# Patient Record
Sex: Male | Born: 1940 | Race: White | Hispanic: No | Marital: Married | Smoking: Current every day smoker
Health system: Southern US, Community
[De-identification: ages and names within clinical notes are randomized; demographics above are authoritative.]

## PROBLEM LIST (undated history)

## (undated) DIAGNOSIS — I739 Peripheral vascular disease, unspecified: Secondary | ICD-10-CM

## (undated) DIAGNOSIS — Z860100 Personal history of colon polyps, unspecified: Secondary | ICD-10-CM

## (undated) DIAGNOSIS — I10 Essential (primary) hypertension: Secondary | ICD-10-CM

## (undated) DIAGNOSIS — Z85828 Personal history of other malignant neoplasm of skin: Secondary | ICD-10-CM

## (undated) DIAGNOSIS — C801 Malignant (primary) neoplasm, unspecified: Secondary | ICD-10-CM

## (undated) DIAGNOSIS — M5090 Cervical disc disorder, unspecified, unspecified cervical region: Secondary | ICD-10-CM

## (undated) DIAGNOSIS — Z800 Family history of malignant neoplasm of digestive organs: Secondary | ICD-10-CM

## (undated) DIAGNOSIS — M5412 Radiculopathy, cervical region: Secondary | ICD-10-CM

## (undated) DIAGNOSIS — G5792 Unspecified mononeuropathy of left lower limb: Secondary | ICD-10-CM

## (undated) DIAGNOSIS — M795 Residual foreign body in soft tissue: Secondary | ICD-10-CM

## (undated) DIAGNOSIS — K589 Irritable bowel syndrome without diarrhea: Secondary | ICD-10-CM

## (undated) DIAGNOSIS — K5790 Diverticulosis of intestine, part unspecified, without perforation or abscess without bleeding: Secondary | ICD-10-CM

## (undated) DIAGNOSIS — R220 Localized swelling, mass and lump, head: Secondary | ICD-10-CM

## (undated) DIAGNOSIS — K5731 Diverticulosis of large intestine without perforation or abscess with bleeding: Secondary | ICD-10-CM

## (undated) DIAGNOSIS — R251 Tremor, unspecified: Secondary | ICD-10-CM

## (undated) DIAGNOSIS — F1010 Alcohol abuse, uncomplicated: Secondary | ICD-10-CM

## (undated) DIAGNOSIS — I252 Old myocardial infarction: Secondary | ICD-10-CM

## (undated) DIAGNOSIS — J309 Allergic rhinitis, unspecified: Secondary | ICD-10-CM

## (undated) DIAGNOSIS — E785 Hyperlipidemia, unspecified: Secondary | ICD-10-CM

## (undated) DIAGNOSIS — D500 Iron deficiency anemia secondary to blood loss (chronic): Secondary | ICD-10-CM

## (undated) DIAGNOSIS — M278 Other specified diseases of jaws: Secondary | ICD-10-CM

## (undated) DIAGNOSIS — Z8619 Personal history of other infectious and parasitic diseases: Secondary | ICD-10-CM

## (undated) DIAGNOSIS — N529 Male erectile dysfunction, unspecified: Secondary | ICD-10-CM

## (undated) DIAGNOSIS — J449 Chronic obstructive pulmonary disease, unspecified: Secondary | ICD-10-CM

## (undated) DIAGNOSIS — Z720 Tobacco use: Secondary | ICD-10-CM

## (undated) DIAGNOSIS — M47812 Spondylosis without myelopathy or radiculopathy, cervical region: Secondary | ICD-10-CM

## (undated) DIAGNOSIS — H409 Unspecified glaucoma: Secondary | ICD-10-CM

## (undated) DIAGNOSIS — D519 Vitamin B12 deficiency anemia, unspecified: Secondary | ICD-10-CM

## (undated) DIAGNOSIS — M109 Gout, unspecified: Secondary | ICD-10-CM

## (undated) DIAGNOSIS — I2510 Atherosclerotic heart disease of native coronary artery without angina pectoris: Secondary | ICD-10-CM

## (undated) DIAGNOSIS — L309 Dermatitis, unspecified: Secondary | ICD-10-CM

## (undated) HISTORY — PX: HERNIA REPAIR: SHX51

## (undated) HISTORY — DX: Personal history of other malignant neoplasm of skin: Z85.828

## (undated) HISTORY — DX: Personal history of colon polyps, unspecified: Z86.0100

## (undated) HISTORY — DX: Vitamin B12 deficiency anemia, unspecified: D51.9

## (undated) HISTORY — DX: Male erectile dysfunction, unspecified: N52.9

## (undated) HISTORY — DX: Chronic obstructive pulmonary disease, unspecified: J44.9

## (undated) HISTORY — DX: Unspecified glaucoma: H40.9

## (undated) HISTORY — DX: Personal history of other infectious and parasitic diseases: Z86.19

## (undated) HISTORY — DX: Alcohol abuse, uncomplicated: F10.10

## (undated) HISTORY — DX: Radiculopathy, cervical region: M54.12

## (undated) HISTORY — DX: Cervical disc disorder, unspecified, unspecified cervical region: M50.90

## (undated) HISTORY — DX: Atherosclerotic heart disease of native coronary artery without angina pectoris: I25.10

## (undated) HISTORY — DX: Diverticulosis of large intestine without perforation or abscess with bleeding: K57.31

## (undated) HISTORY — DX: Personal history of colonic polyps: Z86.010

## (undated) HISTORY — DX: Diverticulosis of intestine, part unspecified, without perforation or abscess without bleeding: K57.90

## (undated) HISTORY — DX: Old myocardial infarction: I25.2

## (undated) HISTORY — DX: Gout, unspecified: M10.9

## (undated) HISTORY — PX: CATARACT EXTRACTION: SUR2

## (undated) HISTORY — DX: Other specified diseases of jaws: M27.8

## (undated) HISTORY — PX: LARYNX SURGERY: SHX692

## (undated) HISTORY — DX: Allergic rhinitis, unspecified: J30.9

## (undated) HISTORY — DX: Tremor, unspecified: R25.1

## (undated) HISTORY — DX: Family history of malignant neoplasm of digestive organs: Z80.0

## (undated) HISTORY — DX: Tobacco use: Z72.0

## (undated) HISTORY — DX: Hyperlipidemia, unspecified: E78.5

## (undated) HISTORY — DX: Irritable bowel syndrome, unspecified: K58.9

## (undated) HISTORY — DX: Localized swelling, mass and lump, head: R22.0

## (undated) HISTORY — DX: Unspecified mononeuropathy of left lower limb: G57.92

## (undated) HISTORY — DX: Spondylosis without myelopathy or radiculopathy, cervical region: M47.812

## (undated) HISTORY — DX: Iron deficiency anemia secondary to blood loss (chronic): D50.0

## (undated) HISTORY — DX: Essential (primary) hypertension: I10

---

## 1996-07-01 DIAGNOSIS — I252 Old myocardial infarction: Secondary | ICD-10-CM | POA: Insufficient documentation

## 1996-07-01 HISTORY — DX: Old myocardial infarction: I25.2

## 2000-09-10 ENCOUNTER — Ambulatory Visit: Admission: RE | Admit: 2000-09-10 | Discharge: 2000-09-10 | Payer: Self-pay | Admitting: Internal Medicine

## 2004-08-27 ENCOUNTER — Ambulatory Visit: Payer: Self-pay | Admitting: *Deleted

## 2005-11-11 ENCOUNTER — Ambulatory Visit: Payer: Self-pay

## 2006-01-16 ENCOUNTER — Ambulatory Visit: Payer: Self-pay | Admitting: Cardiology

## 2006-04-02 ENCOUNTER — Ambulatory Visit (HOSPITAL_COMMUNITY): Admission: RE | Admit: 2006-04-02 | Discharge: 2006-04-02 | Payer: Self-pay | Admitting: Neurosurgery

## 2013-07-07 LAB — HM COLONOSCOPY

## 2016-10-23 ENCOUNTER — Ambulatory Visit: Payer: Self-pay | Admitting: Sports Medicine

## 2017-04-09 ENCOUNTER — Other Ambulatory Visit: Payer: Self-pay

## 2017-04-09 DIAGNOSIS — I739 Peripheral vascular disease, unspecified: Secondary | ICD-10-CM

## 2017-04-11 ENCOUNTER — Encounter: Payer: Self-pay | Admitting: Vascular Surgery

## 2017-04-14 ENCOUNTER — Encounter: Payer: Self-pay | Admitting: Vascular Surgery

## 2017-04-24 ENCOUNTER — Encounter (HOSPITAL_COMMUNITY): Payer: Medicare Other

## 2017-04-24 ENCOUNTER — Encounter: Payer: Medicare Other | Admitting: Vascular Surgery

## 2017-06-02 ENCOUNTER — Ambulatory Visit (HOSPITAL_COMMUNITY)
Admission: RE | Admit: 2017-06-02 | Discharge: 2017-06-02 | Disposition: A | Discharge status: Home / Self Care | Payer: Medicare Other | Source: Ambulatory Visit | Attending: Vascular Surgery | Admitting: Vascular Surgery

## 2017-06-02 ENCOUNTER — Ambulatory Visit (INDEPENDENT_AMBULATORY_CARE_PROVIDER_SITE_OTHER)
Admission: RE | Admit: 2017-06-02 | Discharge: 2017-06-02 | Disposition: A | Discharge status: Home / Self Care | Payer: Medicare Other | Source: Ambulatory Visit | Attending: Vascular Surgery | Admitting: Vascular Surgery

## 2017-06-02 ENCOUNTER — Ambulatory Visit (INDEPENDENT_AMBULATORY_CARE_PROVIDER_SITE_OTHER): Payer: Medicare Other | Admitting: Surgery

## 2017-06-02 ENCOUNTER — Other Ambulatory Visit: Payer: Self-pay

## 2017-06-02 ENCOUNTER — Encounter: Payer: Self-pay | Admitting: Surgery

## 2017-06-02 VITALS — BP 152/79 | HR 69 | Temp 98.1°F | Resp 18 | Ht 68.0 in | Wt 146.0 lb

## 2017-06-02 DIAGNOSIS — I739 Peripheral vascular disease, unspecified: Secondary | ICD-10-CM

## 2017-06-02 DIAGNOSIS — I70213 Atherosclerosis of native arteries of extremities with intermittent claudication, bilateral legs: Secondary | ICD-10-CM | POA: Diagnosis not present

## 2017-06-02 LAB — VAS US LOWER EXTREMITY ARTERIAL DUPLEX
LSFDPSV: 33 cm/s
Left ant tibial distal sys: 16 cm/s
Left super femoral mid sys PSV: -44 cm/s
Left super femoral prox sys PSV: -93 cm/s
RATIBDISTSYS: 8 cm/s
RSFPPSV: 16 cm/s
RTIBDISTSYS: -19 cm/s
Right peroneal sys PSV: 10 cm/s
Right super femoral dist sys PSV: -25 cm/s
Right super femoral mid sys PSV: -19 cm/s
left post tibial dist sys: -14 cm/s

## 2017-06-02 NOTE — Progress Notes (Signed)
[facility] [place]  Patient name: Brian Yates MRN: [id number] DOB: 01/17/1941 Sex: male   REQUESTING PROVIDER:    Dr. Carolanne Grumbling   REASON FOR CONSULT:    PAD  HISTORY OF PRESENT ILLNESS:   Brian Yates is a 76 y.o. male, who is referred today for evaluation of peripheral vascular disease.  The patient states that he has been having difficulty with his legs and walking for long time but it has recently gotten worse.  He states that he can walk approximately 1/4 mile before he has to stop.  He complains of tightening in his calves.  The right leg bothers him more than the left.  He does not have any open wounds.  He denies having any rest pain.  Patient has a history of coronary artery disease.  In the past he has been told he had a heart attack however he had a normal workup recently.  He takes a statin for hypercholesterolemia.  His blood pressure has been well controlled however it has been somewhat erratic recently since he started taking steroids for itching.  PAST MEDICAL HISTORY    Past Medical History:  Diagnosis Date  . Alcohol abuse   . Anemia due to gastrointestinal blood loss   . AR (allergic rhinitis)   . 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 Cervical disc disease   . Cervical radiculopathy   . COLD (chronic obstructive lung disease) ([place])   . Controlled gout   . Coronary atherosclerosis of native coronary artery   . Diverticulosis   . Diverticulosis of colon with hemorrhage   . Erectile dysfunction   . FH: colon cancer   . Glaucoma   . History of hepatitis B   . History of skin cancer   . Hx of colonic polyps   . Hyperlipidemia   . Hypertension   . Irritable bowel syndrome   . Mass of jaw   . Neuropathy of left foot   . Previous myocardial infarction older than 8 weeks 1998  . Tobacco abuse   . Tremor   . Vitamin B12 deficiency anemia      FAMILY HISTORY   Family History   Problem Relation Age of Onset  . Cancer Mother   . Hypertension Father   . Hyperlipidemia Father   . Heart disease Father   . Cancer Sister   . Stroke Maternal Aunt     SOCIAL HISTORY:   Social History   Socioeconomic History  . Marital status: Married    Spouse name: Not on file  . Number of children: Not on file  . Years of education: Not on file  . Highest education level: Not on file  Social Needs  . Financial resource strain: Not on file  . Food insecurity - worry: Not on file  . Food insecurity - inability: Not on file  . Transportation needs - medical: Not on file  . Transportation needs - non-medical: Not on file  Occupational History  . Not on file  Tobacco Use  . Smoking status: Current Every Day Smoker  . Smokeless tobacco: Never Used  Substance and Sexual Activity  . Alcohol use: No  . Drug use: No  . Sexual activity: Not on file  Other Topics Concern  . Not on file  Social History Narrative  . Not on file    ALLERGIES:    Allergies  Allergen Reactions  . Codeine Sulfate [Codeine]  CURRENT MEDICATIONS:    Current Outpatient Medications  Medication Sig Dispense Refill  . Ascorbic Acid (VITAMIN C) 1000 MG tablet Take 1,000 mg by mouth daily.    Marland Kitchen atorvastatin (LIPITOR) 40 MG tablet Take 40 mg by mouth daily.    . brimonidine (ALPHAGAN P) 0.1 % SOLN Place 1 drop into both eyes daily.    Marland Kitchen CALCIUM-VITAMIN D PO Take 600 mg by mouth 2 (two) times daily.    . candesartan (ATACAND) 16 MG tablet Take 16 mg by mouth daily.    . candesartan-hydrochlorothiazide (ATACAND HCT) 16-12.5 MG tablet Take 1 tablet by mouth daily.    . cholecalciferol (VITAMIN D) 1000 units tablet Take 1,000 Units by mouth daily.    . Coenzyme Q10 100 MG TABS Take 100 mg by mouth daily.    . colchicine 0.6 MG tablet Take 0.6 mg by mouth every 6 (six) hours.    Marland Kitchen DICYCLOMINE HCL PO Take 20 mg by mouth 4 (four) times daily.    . diphenhydrAMINE (BENADRYL) 25 MG tablet Take 25  mg by mouth as needed.    . dorzolamide (TRUSOPT) 2 % ophthalmic solution Place 1 drop into the right eye 2 (two) times daily.    Marland Kitchen doxepin (SINEQUAN) 10 MG capsule Take 10 mg by mouth 2 (two) times daily.    . fenofibrate micronized (LOFIBRA) 134 MG capsule Take 134 mg by mouth daily before breakfast.    . fexofenadine (ALLEGRA) 180 MG tablet Take 180 mg by mouth daily.    . Glucosamine Sulfate 1000 MG CAPS Take 1,000 mg by mouth daily.    Marland Kitchen HYDROXYZINE HCL PO Take 50 mg by mouth 4 (four) times daily as needed.    . isosorbide mononitrate (IMDUR) 30 MG 24 hr tablet Take 30 mg by mouth daily.    . Lactobacillus (PROBIOTIC ACIDOPHILUS PO) Take by mouth daily.    Marland Kitchen latanoprost (XALATAN) 0.005 % ophthalmic solution Place 1 drop into both eyes at bedtime.    . nitroGLYCERIN (NITROSTAT) 0.4 MG SL tablet Place 0.4 mg under the tongue every 5 (five) minutes as needed for chest pain.    . Omega-3 Fatty Acids (FISH OIL BURP-LESS) 500 MG CAPS Take 500 mg by mouth 2 (two) times daily.    . prednisoLONE 5 MG TABS tablet Take 5 mg by mouth daily.    . TRAZODONE HCL PO Take 50 mg by mouth at bedtime.    . triamcinolone cream (TRIDERM) 0.5 % Apply 1 application topically daily.    . vitamin B-12 (CYANOCOBALAMIN) 1000 MCG tablet Take 1,000 mcg by mouth 2 (two) times daily.     No current facility-administered medications for this visit.     REVIEW OF SYSTEMS:   [X]  denotes positive finding, [ ]  denotes negative finding Cardiac  Comments:  Chest pain or chest pressure:    Shortness of breath upon exertion:    Short of breath when lying flat:    Irregular heart rhythm:        Vascular    Pain in calf, thigh, or hip brought on by ambulation: x   Pain in feet at night that wakes you up from your sleep:     Blood clot in your veins:    Leg swelling:         Pulmonary    Oxygen at home:    Productive cough:  x   Wheezing:  x       Neurologic    Sudden weakness  in arms or legs:     Sudden numbness  in arms or legs:     Sudden onset of difficulty speaking or slurred speech:    Temporary loss of vision in one eye:     Problems with dizziness:         Gastrointestinal    Blood in stool:      Vomited blood:         Genitourinary    Burning when urinating:     Blood in urine:        Psychiatric    Major depression:         Hematologic    Bleeding problems:    Problems with blood clotting too easily:        Skin    Rashes or ulcers:        Constitutional    Fever or chills:     PHYSICAL EXAM:   Vitals:   06/02/17 1318 06/02/17 1324  BP: (!) 148/67 (!) 152/79  Pulse: 69 69  Resp: 18   Temp: 98.1 F (36.7 C)   TempSrc: Oral   SpO2: 97%   Weight: 146 lb (66.2 kg)   Height: 5\' 8"  (1.727 m)     GENERAL: The patient is a well-nourished male, in no acute distress. The vital signs are documented above. CARDIAC: There is a regular rate and rhythm.  VASCULAR: Palpable femoral pulses bilaterally.  Pedal pulses are nonpalpable.  No carotid bruits.  No pulsatile abdominal mass. PULMONARY: Nonlabored respirations ABDOMEN: Soft and non-tender  MUSCULOSKELETAL: There are no major deformities or cyanosis. NEUROLOGIC: No focal weakness or paresthesias are detected. SKIN: There are no ulcers or rashes noted. PSYCHIATRIC: The patient has a normal affect.  STUDIES:   I reviewed his vascular lab studies.  The ABIs are 0.7 on the right and 0.69 on the left.  There is a 50-74% stenosis in the right proximal SFA.  There is a 50-70% stenosis in the left deep femoral artery stenosis in the distal left superficial femoral artery.  ASSESSMENT and PLAN   PAD: The patient does not yet have symptoms of lifestyle limiting claudication although he does have significant symptoms.  We stressed the importance of medical management.  This would include taking antiplatelet medication  (81 mg) if possible, given his GI bleed 2 years ago.  He should continue his statin as well as his blood pressure  medication.  We addressed the issue of smoking, he is not at a point where he would like to quit.  We also discussed an exercise regimen and what this program will look like.  He is going to try to improve his exercise.  He is not interested in intervention at this point in time nor did I recommend this for him.  I would not give him a trial of cilostazol given his history of GI bleed.  He will try to implement the strategies and follow-up with me in 6 months.   Annamarie Major, MD [facility]: [place] [address] [phone] Pager [phone]

## 2017-06-04 ENCOUNTER — Encounter: Payer: Self-pay | Admitting: Internal Medicine

## 2017-06-13 NOTE — Addendum Note (Signed)
Addended by: Lianne Cure A on: 06/13/2017 12:24 PM   Modules accepted: Orders

## 2017-12-08 ENCOUNTER — Encounter (HOSPITAL_COMMUNITY): Payer: Medicare Other

## 2017-12-08 ENCOUNTER — Ambulatory Visit: Payer: Medicare Other | Admitting: Surgery

## 2017-12-29 ENCOUNTER — Encounter: Payer: Self-pay | Admitting: Surgery

## 2017-12-29 ENCOUNTER — Other Ambulatory Visit: Payer: Self-pay

## 2017-12-29 ENCOUNTER — Ambulatory Visit (HOSPITAL_COMMUNITY)
Admission: RE | Admit: 2017-12-29 | Discharge: 2017-12-29 | Disposition: A | Discharge status: Home / Self Care | Payer: Medicare Other | Source: Ambulatory Visit | Attending: Surgery | Admitting: Surgery

## 2017-12-29 ENCOUNTER — Ambulatory Visit (INDEPENDENT_AMBULATORY_CARE_PROVIDER_SITE_OTHER): Payer: Medicare Other | Admitting: Surgery

## 2017-12-29 VITALS — BP 126/54 | HR 46 | Temp 97.9°F | Resp 16 | Ht 68.0 in | Wt 158.0 lb

## 2017-12-29 DIAGNOSIS — I10 Essential (primary) hypertension: Secondary | ICD-10-CM | POA: Diagnosis not present

## 2017-12-29 DIAGNOSIS — E785 Hyperlipidemia, unspecified: Secondary | ICD-10-CM | POA: Diagnosis not present

## 2017-12-29 DIAGNOSIS — I70213 Atherosclerosis of native arteries of extremities with intermittent claudication, bilateral legs: Secondary | ICD-10-CM | POA: Diagnosis not present

## 2017-12-29 DIAGNOSIS — F17200 Nicotine dependence, unspecified, uncomplicated: Secondary | ICD-10-CM | POA: Diagnosis not present

## 2017-12-29 NOTE — Progress Notes (Signed)
[facility] [place]  Patient name: Brian Yates MRN: [id number] DOB: 07-May-1941 Sex: male   REASON FOR VISIT:    Follow up claudication  HISOTRY OF PRESENT ILLNESS:    Brian Yates is a 77 y.o. male, who returns today for follow up of his peripheral vascular disease.  The patient has been having difficulty with his legs and walking for long time but it had recently gotten worse, and therefore he was scheduled to see me.  In December 2018, he could walk approximately 1/4 mile before he had to stop.  He complained of tightening in his calves, which was more severe in the right leg.  His ultrasound showed bilateral SFA stenoses.  He was not at a point where I felt intervention was recommended.  We talked about optimizing his medical management.  We talked about smoking cessation which he was not ready to start.  I did not start him on Pletal because of his history of GI bleed.  We did discuss the importance of an exercise program.  He states today that he has been trying to improve his exercise.  He has not noticed any change in his symptoms.  He is now considering smoking cessation  Patient has a history of coronary artery disease.  In the past he has been told he had a heart attack however he had a normal workup recently.  He takes a statin for hypercholesterolemia.  His blood pressure has been well controlled however it has been somewhat erratic recently since he started taking steroids for itching.   PAST MEDICAL HISTORY:   Past Medical History:  Diagnosis Date  . Alcohol abuse   . Anemia due to gastrointestinal blood loss   . AR (allergic rhinitis)   . 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 Cervical disc disease   . Cervical radiculopathy   . COLD (chronic obstructive lung disease) ([place])   . Controlled gout   . Coronary atherosclerosis of native coronary artery   . Diverticulosis   . Diverticulosis of  colon with hemorrhage   . Erectile dysfunction   . FH: colon cancer   . Glaucoma   . History of hepatitis B   . History of skin cancer   . Hx of colonic polyps   . Hyperlipidemia   . Hypertension   . Irritable bowel syndrome   . Mass of jaw   . Neuropathy of left foot   . Previous myocardial infarction older than 8 weeks 1998  . Tobacco abuse   . Tremor   . Vitamin B12 deficiency anemia      FAMILY HISTORY:   Family History  Problem Relation Age of Onset  . Cancer Mother   . Hypertension Father   . Hyperlipidemia Father   . Heart disease Father   . Cancer Sister   . Stroke Maternal Aunt     SOCIAL HISTORY:   Social History   Tobacco Use  . Smoking status: Current Every Day Smoker  . Smokeless tobacco: Never Used  Substance Use Topics  . Alcohol use: No     ALLERGIES:   Allergies  Allergen Reactions  . Codeine Sulfate [Codeine]      CURRENT MEDICATIONS:   Current Outpatient Medications  Medication Sig Dispense Refill  . Ascorbic Acid (VITAMIN C) 1000 MG tablet Take 1,000 mg by mouth daily.    Marland Kitchen atorvastatin (LIPITOR) 40 MG tablet Take 20 mg by mouth daily.     Marland Kitchen  brimonidine (ALPHAGAN P) 0.1 % SOLN Place 1 drop into both eyes daily.    Marland Kitchen CALCIUM-VITAMIN D PO Take 600 mg by mouth 2 (two) times daily.    . candesartan-hydrochlorothiazide (ATACAND HCT) 16-12.5 MG tablet Take 1 tablet by mouth daily.    . cholecalciferol (VITAMIN D) 1000 units tablet Take 1,000 Units by mouth daily.    . Coenzyme Q10 100 MG TABS Take 100 mg by mouth daily.    Marland Kitchen DICYCLOMINE HCL PO Take 20 mg by mouth 4 (four) times daily.    . fenofibrate micronized (LOFIBRA) 134 MG capsule Take 134 mg by mouth daily before breakfast.    . fexofenadine (ALLEGRA) 180 MG tablet Take 180 mg by mouth daily.    . Glucosamine Sulfate 1000 MG CAPS Take 1,000 mg by mouth daily.    Marland Kitchen HYDROXYZINE HCL PO Take 50 mg by mouth 4 (four) times daily as needed.    . isosorbide mononitrate (IMDUR) 30 MG 24  hr tablet Take 30 mg by mouth daily.    . Lactobacillus (PROBIOTIC ACIDOPHILUS PO) Take by mouth daily.    Marland Kitchen latanoprost (XALATAN) 0.005 % ophthalmic solution Place 1 drop into both eyes at bedtime.    . nitroGLYCERIN (NITROSTAT) 0.4 MG SL tablet Place 0.4 mg under the tongue every 5 (five) minutes as needed for chest pain.    . Omega-3 Fatty Acids (FISH OIL BURP-LESS) 500 MG CAPS Take 500 mg by mouth 2 (two) times daily.    . prednisoLONE 5 MG TABS tablet Take 5 mg by mouth daily.    . TRAZODONE HCL PO Take 50 mg by mouth at bedtime.    . triamcinolone cream (TRIDERM) 0.5 % Apply 1 application topically daily.    . vitamin B-12 (CYANOCOBALAMIN) 1000 MCG tablet Take 1,000 mcg by mouth 2 (two) times daily.     No current facility-administered medications for this visit.     REVIEW OF SYSTEMS:   [X]  denotes positive finding, [ ]  denotes negative finding Cardiac  Comments:  Chest pain or chest pressure:    Shortness of breath upon exertion:    Short of breath when lying flat:    Irregular heart rhythm:        Vascular    Pain in calf, thigh, or hip brought on by ambulation: x   Pain in feet at night that wakes you up from your sleep:     Blood clot in your veins:    Leg swelling:         Pulmonary    Oxygen at home:    Productive cough:  x   Wheezing:  x       Neurologic    Sudden weakness in arms or legs:  x   Sudden numbness in arms or legs:     Sudden onset of difficulty speaking or slurred speech:    Temporary loss of vision in one eye:     Problems with dizziness:         Gastrointestinal    Blood in stool:     Vomited blood:         Genitourinary    Burning when urinating:     Blood in urine:        Psychiatric    Major depression:         Hematologic    Bleeding problems:    Problems with blood clotting too easily:        Skin    Rashes  or ulcers:        Constitutional    Fever or chills:      PHYSICAL EXAM:   Vitals:   12/29/17 1110  BP: (!)  126/54  Pulse: (!) 46  Resp: 16  Temp: 97.9 F (36.6 C)  TempSrc: Oral  SpO2: 97%  Weight: 158 lb (71.7 kg)  Height: 5\' 8"  (1.727 m)    GENERAL: The patient is a well-nourished male, in no acute distress. The vital signs are documented above. CARDIAC: There is a regular rate and rhythm.  VASCULAR: Nonpalpable pedal pulses.  No carotid bruits. PULMONARY: Non-labored respirations MUSCULOSKELETAL: There are no major deformities or cyanosis. NEUROLOGIC: No focal weakness or paresthesias are detected. SKIN: There are no ulcers or rashes noted. PSYCHIATRIC: The patient has a normal affect.  STUDIES:   I have ordered and reviewed his vascular lab studies with the following findings: Right ABI= 0.37 (0.70) Left ABI= 0.49 (0.69)  MEDICAL ISSUES:   PAD: The patient has not had any change in his symptoms.  His ABIs have decreased.  At this time no additional recommendations were given.  He will continue with optimal medical management and a structured exercise program.  He knows to contact me should he develop a change in his symptoms, otherwise I will see him back in a year.  He tells me that he is considering smoking cessation, I have encouraged him to continue with this process.    Annamarie Major, MD [facility] [phone] Pager [phone]

## 2018-03-20 DIAGNOSIS — J381 Polyp of vocal cord and larynx: Secondary | ICD-10-CM | POA: Insufficient documentation

## 2018-03-20 DIAGNOSIS — R490 Dysphonia: Secondary | ICD-10-CM | POA: Insufficient documentation

## 2018-03-20 DIAGNOSIS — K148 Other diseases of tongue: Secondary | ICD-10-CM | POA: Insufficient documentation

## 2018-04-01 DIAGNOSIS — K219 Gastro-esophageal reflux disease without esophagitis: Secondary | ICD-10-CM | POA: Insufficient documentation

## 2018-06-17 DIAGNOSIS — R430 Anosmia: Secondary | ICD-10-CM | POA: Insufficient documentation

## 2019-11-22 NOTE — Progress Notes (Signed)
Cardiology Office Note:    Date:  11/23/2019   ID:  Brian Yates, DOB November 29, 1940, MRN [id number]  PCP:  Nicoletta Dress, MD  Cardiologist:  Shirlee More, MD   Referring MD: Nicoletta Dress, MD  ASSESSMENT:    1. Junctional rhythm   2. Right bundle branch block (RBBB) with left anterior hemiblock   3. Essential hypertension   4. Mixed hyperlipidemia   5. PAD (peripheral artery disease) ([place])   6. Coronary artery disease of bypass graft of native heart with stable angina pectoris ([place])    PLAN:    In order of problems listed above:  1. 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  Personally I think he is at high risk of needing a pacemaker and for further evaluation I asked him to allow [place] to do a 7-day ZIO monitor to document for other bradycardia arrhythmia.  I will also like him to get an echocardiogram to assess ejection fraction presence of previous myocardial infarction as if his ejection fraction was less than 35 we would need to consider ICD therapy along with a pacemaker if needed.  At this time I do not think he needs an ischemia evaluation he is on no rate slowing medication will follow up with me afterwards. 2. BP at target continue current treatment 3. Ideal lipids continue his combined statin fenofibrate 4. Severe limiting claudication I think he will except intervention if recommended by vascular surgery angiogram 5. Uncertain diagnosis of CAD check echocardiogram for evidence of LV dysfunction previous MI.  He is having no angina right now I would not pursue an ischemia evaluation  Next appointment 6 weeks   Medication Adjustments/Labs and Tests Ordered: Current medicines are reviewed at length with the patient today.  Concerns regarding medicines are outlined above.  Orders Placed This Encounter  Procedures  . LONG TERM MONITOR  (3-14 DAYS)  . EKG 12-Lead  . ECHOCARDIOGRAM COMPLETE   No orders of the defined types were placed in this encounter.    Chief Complaint  Patient presents with  . Bradycardia    History of Present Illness:    Brian Yates is a 79 y.o. male who is being seen today for the evaluation of atrial fibrillation at the request of Nicoletta Dress, MD.  He was referred after EKG showed atrial fibrillation bifascicular heart block and a slow ventricular response.  Review of medications shows that he is not anticoagulated and is on no rate slowing medications.  His CHA2DS2-VASc score is 4 moderate stroke risk. He has a history of PAD and coronary artery disease.  Other diagnoses from primary care physician records include coronary artery disease and COPD. He had a cerebrovascular duplex performed at [facility] system 10/21/2019 which shows retrograde left vertebral artery flow suggesting proximal subclavian artery stenosis and bilateral bifurcation and proximal ICA plaque 70 no 99% on the left less than 50% on the right EKG dated 10/21/2019 shows atrial fibrillation with a slow ventricular response right bundle branch block QRS duration 136 ms left anterior hemiblock.  Is retired Public house manager.  Unfortunately peripheral arterial disease limits up to about 50 to 70 yards on flat surface and at times his wife has to come pick him up in the car to bring him home.  He sees vascular surgery and may need intervention.  Years ago he was seen  by cardiology in [place] on the basis of EKG and stress test was told he had a previous MI but did not have coronary angiography Intervention.  Recently his ARB was discontinued because of low blood pressure.  He started tracking heart rate and blood pressure home and he gets a couple of episodes of rates of 45 to 50 bpm.  His primary care physician's office an EKG was done interpreted as atrial fibrillation but on further review shows a  junctional rhythm with retrograde P waves occasional conduction echo beat and rates as low as 45 to 50 bpm.  Surprisingly has been asymptomatic he has had no palpitations syncope near syncope no chest pain edema shortness of breath.  He complains bitterly of his peripheral vascular disease. Past Medical History:  Diagnosis Date  . Alcohol abuse   . Anemia due to gastrointestinal blood loss   . AR (allergic rhinitis)   . 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 Cervical disc disease   . Cervical radiculopathy   . COLD (chronic obstructive lung disease) ([place])   . Controlled gout   . Coronary atherosclerosis of native coronary artery   . Diverticulosis   . Diverticulosis of colon with hemorrhage   . Erectile dysfunction   . FH: colon cancer   . Glaucoma   . History of hepatitis B   . History of skin cancer   . Hx of colonic polyps   . Hyperlipidemia   . Hypertension   . Irritable bowel syndrome   . Mass of jaw   . Neuropathy of left foot   . Previous myocardial infarction older than 8 weeks 1998  . Tobacco abuse   . Tremor   . Vitamin B12 deficiency anemia     Past Surgical History:  Procedure Laterality Date  . CATARACT EXTRACTION Left   . HERNIA REPAIR Bilateral   . LARYNX SURGERY      Current Medications: Current Meds  Medication Sig  . Ascorbic Acid (VITAMIN C) 1000 MG tablet Take 1,000 mg by mouth daily.  Marland Kitchen atorvastatin (LIPITOR) 40 MG tablet Take 20 mg by mouth daily.   . brimonidine (ALPHAGAN P) 0.1 % SOLN Place 1 drop into both eyes daily.  Marland Kitchen CALCIUM-VITAMIN D PO Take 600 mg by mouth 2 (two) times daily.  . cholecalciferol (VITAMIN D) 1000 units tablet Take 1,000 Units by mouth daily.  . fenofibrate micronized (LOFIBRA) 134 MG capsule Take 134 mg by mouth daily before breakfast.  . hydrOXYzine (ATARAX/VISTARIL) 50 MG tablet Take 1 tablet by mouth 4 (four) times daily as needed.  . isosorbide mononitrate (IMDUR) 30 MG 24 hr tablet Take 30 mg by mouth daily.    . Lactobacillus (PROBIOTIC ACIDOPHILUS PO) Take by mouth daily.  Marland Kitchen latanoprost (XALATAN) 0.005 % ophthalmic solution Place 1 drop into both eyes at bedtime.  . Misc Natural Products (ADV TURMERIC CURCUMIN COMPLEX PO) Take by mouth.  Edyth Gunnels Oleifera (MORINGA PO) Take 700 mg by mouth 2 (two) times daily.  . nitroGLYCERIN (NITROSTAT) 0.4 MG SL tablet Place 0.4 mg under the tongue every 5 (five) minutes as needed for chest pain.  . Omega-3 Fatty Acids (FISH OIL BURP-LESS) 500 MG CAPS Take 500 mg by mouth 2 (two) times daily.  . temazepam (RESTORIL) 30 MG capsule Take 30 mg by mouth at bedtime as needed.  . TRAZODONE HCL PO Take 50 mg by mouth at bedtime.  . vitamin B-12 (CYANOCOBALAMIN) 1000 MCG tablet Take 1,000 mcg by  mouth 2 (two) times daily.     Allergies:   Codeine sulfate [codeine]   Social History   Socioeconomic History  . Marital status: Married    Spouse name: Not on file  . Number of children: Not on file  . Years of education: Not on file  . Highest education level: Not on file  Occupational History  . Not on file  Tobacco Use  . Smoking status: Current Every Day Smoker  . Smokeless tobacco: Never Used  Substance and Sexual Activity  . Alcohol use: No  . Drug use: No  . Sexual activity: Not on file  Other Topics Concern  . Not on file  Social History Narrative  . Not on file   Social Determinants of Health   Financial Resource Strain:   . Difficulty of Paying Living Expenses:   Food Insecurity:   . Worried About Charity fundraiser in the Last Year:   . Arboriculturist in the Last Year:   Transportation Needs:   . Film/video editor (Medical):   Marland Kitchen Lack of Transportation (Non-Medical):   Physical Activity:   . Days of Exercise per Week:   . Minutes of Exercise per Session:   Stress:   . Feeling of Stress :   Social Connections:   . Frequency of Communication with Friends and Family:   . Frequency of Social Gatherings with Friends and Family:   .  Attends Religious Services:   . Active Member of Clubs or Organizations:   . Attends Archivist Meetings:   Marland Kitchen Marital Status:      Family History: The patient's family history includes Cancer in his mother and sister; Heart disease in his father; Hyperlipidemia in his father; Hypertension in his father; Stroke in his maternal aunt.  ROS:   Review of Systems  Constitution: Negative.  HENT: Negative.   Eyes: Negative.   Cardiovascular: Negative.   Respiratory: Negative.   Endocrine: Negative.   Hematologic/Lymphatic: Negative.   Skin: Negative.   Musculoskeletal: Positive for muscle cramps and muscle weakness.  Gastrointestinal: Negative.   Genitourinary: Negative.   Neurological: Negative.   Psychiatric/Behavioral: Negative.   Allergic/Immunologic: Negative.    Please see the history of present illness.     All other systems reviewed and are negative.  EKGs/Labs/Other Studies Reviewed:    The following studies were reviewed today:   EKG:  EKG is  ordered today.  The ekg ordered today is personally reviewed and demonstrates sinus rhythm bifascicular heart block first-degree AV block.  Recent Labs: 08/10/2018: Cholesterol 112 triglycerides 63 HDL 43 LDL 56, lipids are at target Hemoglobin 12.1 normal Creatinine mildly elevated at 1.29  Physical Exam:    VS:  BP (!) 124/58   Pulse 64   Ht 5\' 8"  (1.727 m)   Wt 144 lb 3.2 oz (65.4 kg)   SpO2 97%   BMI 21.93 kg/m     Wt Readings from Last 3 Encounters:  11/23/19 144 lb 3.2 oz (65.4 kg)  12/29/17 158 lb (71.7 kg)  06/02/17 146 lb (66.2 kg)     GEN: He looks frail chronically ill COPD appearance well nourished, well developed in no acute distress HEENT: Normal NECK: No JVD; No carotid bruits LYMPHATICS: No lymphadenopathy CARDIAC: Distant heart sounds RRR, no murmurs, rubs, gallops RESPIRATORY:  Clear to auscultation without rales, wheezing or rhonchi  ABDOMEN: Soft, non-tender,  non-distended MUSCULOSKELETAL:  No edema; No deformity  SKIN: Warm and dry NEUROLOGIC:  Alert and oriented x 3 PSYCHIATRIC:  Normal affect     Signed, Shirlee More, MD  11/23/2019 11:56 AM    [place]

## 2019-11-23 ENCOUNTER — Ambulatory Visit (INDEPENDENT_AMBULATORY_CARE_PROVIDER_SITE_OTHER): Payer: Medicare Other

## 2019-11-23 ENCOUNTER — Ambulatory Visit (INDEPENDENT_AMBULATORY_CARE_PROVIDER_SITE_OTHER): Payer: Medicare Other | Admitting: Cardiology

## 2019-11-23 ENCOUNTER — Other Ambulatory Visit: Payer: Self-pay

## 2019-11-23 ENCOUNTER — Encounter: Payer: Self-pay | Admitting: Cardiology

## 2019-11-23 VITALS — BP 124/58 | HR 64 | Ht 68.0 in | Wt 144.2 lb

## 2019-11-23 DIAGNOSIS — I452 Bifascicular block: Secondary | ICD-10-CM

## 2019-11-23 DIAGNOSIS — I10 Essential (primary) hypertension: Secondary | ICD-10-CM | POA: Diagnosis not present

## 2019-11-23 DIAGNOSIS — I25708 Atherosclerosis of coronary artery bypass graft(s), unspecified, with other forms of angina pectoris: Secondary | ICD-10-CM

## 2019-11-23 DIAGNOSIS — I739 Peripheral vascular disease, unspecified: Secondary | ICD-10-CM

## 2019-11-23 DIAGNOSIS — I498 Other specified cardiac arrhythmias: Secondary | ICD-10-CM

## 2019-11-23 DIAGNOSIS — R002 Palpitations: Secondary | ICD-10-CM | POA: Diagnosis not present

## 2019-11-23 DIAGNOSIS — E782 Mixed hyperlipidemia: Secondary | ICD-10-CM

## 2019-11-23 NOTE — Patient Instructions (Signed)
Medication Instructions:  Your physician recommends that you continue on your current medications as directed. Please refer to the Current Medication list given to you today.  *If you need a refill on your cardiac medications before your next appointment, please call your pharmacy*   Lab Work: None If you have labs (blood work) drawn today and your tests are completely normal, you will receive your results only by: Marland Kitchen MyChart Message (if you have MyChart) OR . A paper copy in the mail If you have any lab test that is abnormal or we need to change your treatment, we will call you to review the results.   Testing/Procedures: A zio monitor was ordered today. It will remain on for 7 days. You will then return monitor and event diary in provided box. It takes 1-2 weeks for report to be downloaded and returned to [place]. We will call you with the results. If monitor falls off or has orange flashing light, please call Zio for further instructions.   Your physician has requested that you have an echocardiogram. Echocardiography is a painless test that uses sound waves to create images of your heart. It provides your doctor with information about the size and shape of your heart and how well your heart's chambers and valves are working. This procedure takes approximately one hour. There are no restrictions for this procedure.       Follow-Up: At [facility], you and your health needs are our priority.  As part of our continuing mission to provide you with exceptional heart care, we have created designated Provider Care Teams.  These Care Teams include your primary Cardiologist (physician) and Advanced Practice Providers (APPs -  Physician Assistants and Nurse Practitioners) who all work together to provide you with the care you need, when you need it.  We recommend signing up for the patient portal called "MyChart".  Sign up information is provided on this After Visit Summary.  MyChart is used to  connect with patients for Virtual Visits (Telemedicine).  Patients are able to view lab/test results, encounter notes, upcoming appointments, etc.  Non-urgent messages can be sent to your provider as well.   To learn more about what you can do with MyChart, go to [email or web address].    Your next appointment:   6 week(s)  The format for your next appointment:   In Person  Provider:   Shirlee More, MD   Other Instructions

## 2019-12-09 ENCOUNTER — Ambulatory Visit (INDEPENDENT_AMBULATORY_CARE_PROVIDER_SITE_OTHER): Payer: Medicare Other

## 2019-12-09 ENCOUNTER — Other Ambulatory Visit: Payer: Self-pay

## 2019-12-09 DIAGNOSIS — I10 Essential (primary) hypertension: Secondary | ICD-10-CM

## 2019-12-09 DIAGNOSIS — I25708 Atherosclerosis of coronary artery bypass graft(s), unspecified, with other forms of angina pectoris: Secondary | ICD-10-CM

## 2019-12-09 DIAGNOSIS — I452 Bifascicular block: Secondary | ICD-10-CM | POA: Diagnosis not present

## 2019-12-09 DIAGNOSIS — I498 Other specified cardiac arrhythmias: Secondary | ICD-10-CM | POA: Diagnosis not present

## 2019-12-09 NOTE — Progress Notes (Signed)
Complete echocardiogram performed.  Jimmy Balen Woolum RDCS, RVT  

## 2019-12-10 ENCOUNTER — Telehealth: Payer: Self-pay

## 2019-12-10 NOTE — Telephone Encounter (Signed)
-----   Message from Richardo Priest, MD sent at 12/10/2019  7:58 AM EDT ----- Normal or stable result  We did the test to look at left ventricular function is normal I can review the other findings will be see him back in the office in follow-up

## 2019-12-10 NOTE — Telephone Encounter (Signed)
Spoke with patient regarding results and recommendation.  Patient verbalizes understanding and is agreeable to plan of care. Advised patient to call back with any issues or concerns.  

## 2019-12-10 NOTE — Telephone Encounter (Signed)
Left message on patients voicemail to please return our call.   

## 2019-12-16 ENCOUNTER — Telehealth: Payer: Self-pay

## 2019-12-16 DIAGNOSIS — I440 Atrioventricular block, first degree: Secondary | ICD-10-CM

## 2019-12-16 NOTE — Telephone Encounter (Signed)
-----   Message from Richardo Priest, MD sent at 12/16/2019 12:57 PM EDT ----- Normal or stable result  He is having frequent episodes of sinus pauses up to 3.5 seconds with bifascicular heart block and first-degree AV block.

## 2019-12-16 NOTE — Telephone Encounter (Signed)
Spoke to patients son Brian Yates regarding these results and recommendations. I let him know that Dr. Bettina Gavia would like for him to be seen by EP as soon as possible. I let him know that I put in the referral and they should receive a call soon to schedule this appointment. He verbalizes understanding and states that he will let his mother and father know as soon as he can but they are at a funeral today and that is why they could not answer my call.   I am routing this to [address] to get this patient scheduled with one of their EP doctors.

## 2019-12-17 ENCOUNTER — Telehealth: Payer: Self-pay | Admitting: Cardiology

## 2019-12-17 NOTE — Telephone Encounter (Signed)
Spoke to the patient just now and went over his results and recommendations with him. He states that he did speak with his son and he went over everything with him as well. They are awaiting a call to be scheduled with an EP doctor at this time.

## 2019-12-17 NOTE — Telephone Encounter (Signed)
Patient is returning Morgan's call in regards to monitor results.

## 2019-12-20 ENCOUNTER — Other Ambulatory Visit: Payer: Self-pay | Admitting: *Deleted

## 2019-12-20 DIAGNOSIS — I70213 Atherosclerosis of native arteries of extremities with intermittent claudication, bilateral legs: Secondary | ICD-10-CM

## 2019-12-30 ENCOUNTER — Ambulatory Visit: Payer: Medicare Other | Admitting: Cardiology

## 2019-12-31 ENCOUNTER — Encounter (HOSPITAL_COMMUNITY): Payer: Medicare Other

## 2019-12-31 ENCOUNTER — Ambulatory Visit: Payer: Medicare Other

## 2020-01-10 ENCOUNTER — Encounter (HOSPITAL_COMMUNITY): Payer: Medicare Other

## 2020-01-10 ENCOUNTER — Ambulatory Visit: Payer: Medicare Other

## 2020-01-24 ENCOUNTER — Other Ambulatory Visit: Payer: Self-pay

## 2020-01-24 ENCOUNTER — Encounter: Payer: Self-pay | Admitting: Cardiology

## 2020-01-24 ENCOUNTER — Ambulatory Visit (INDEPENDENT_AMBULATORY_CARE_PROVIDER_SITE_OTHER): Payer: Medicare Other | Admitting: Cardiology

## 2020-01-24 DIAGNOSIS — I455 Other specified heart block: Secondary | ICD-10-CM | POA: Diagnosis not present

## 2020-01-24 NOTE — Progress Notes (Signed)
Electrophysiology Office Note   Date:  01/24/2020   ID:  SARON TWEED, DOB 15-Feb-1941, MRN [id number]  PCP:  Nicoletta Dress, MD  Cardiologist: Bettina Gavia Primary Electrophysiologist:  Lorenza Winkleman Meredith Leeds, MD    Chief Complaint: Sinus pauses   History of Present Illness: Brian Yates is a 79 y.o. male who is being seen today for the evaluation of bradycardia at the request of Nicoletta Dress, MD. Presenting today for electrophysiology evaluation.  He has a history of atrial fibrillation, bifascicular block, coronary artery disease, COPD, hepatitis B, hypertension, hyperlipidemia.  He also has peripheral arterial disease and is limited in walking of about 50 to 70 yards on a flat surface.  He presented to his primary care's office in junctional rhythm.  His heart rates were in the high 40s to 50s at the time.  He wore cardiac monitor that showed sinus rhythm with episodic sinus pauses.    Today, he denies symptoms of palpitations, chest pain, shortness of breath, orthopnea, PND, lower extremity edema, claudication, dizziness, presyncope, syncope, bleeding, or neurologic sequela. The patient is tolerating medications without difficulties.  He currently feels well.  He has no chest pain or shortness of breath.  Unfortunately, he had a chest x-ray done that showed a mass in his lung.  He is a former smoker and has plans for CT scan to be done.  Aside from that   Past Medical History:  Diagnosis Date  . Alcohol abuse   . Anemia due to gastrointestinal blood loss   . AR (allergic rhinitis)   . 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 Cervical disc disease   . Cervical radiculopathy   . COLD (chronic obstructive lung disease) ([place])   . Controlled gout   . Coronary atherosclerosis of native coronary artery   . Diverticulosis   . Diverticulosis of colon with hemorrhage   . Erectile dysfunction   . FH: colon cancer   . Glaucoma   . History of hepatitis B   . History of  skin cancer   . Hx of colonic polyps   . Hyperlipidemia   . Hypertension   . Irritable bowel syndrome   . Mass of jaw   . Neuropathy of left foot   . Previous myocardial infarction older than 8 weeks 1998  . Tobacco abuse   . Tremor   . Vitamin B12 deficiency anemia    Past Surgical History:  Procedure Laterality Date  . CATARACT EXTRACTION Left   . HERNIA REPAIR Bilateral   . LARYNX SURGERY       Current Outpatient Medications  Medication Sig Dispense Refill  . Ascorbic Acid (VITAMIN C) 1000 MG tablet Take 1,000 mg by mouth daily.    Marland Kitchen aspirin EC 81 MG tablet Take 81 mg by mouth daily. Swallow whole.    Marland Kitchen atorvastatin (LIPITOR) 40 MG tablet Take 20 mg by mouth daily.     . brimonidine (ALPHAGAN P) 0.1 % SOLN Place 1 drop into both eyes daily.    Marland Kitchen CALCIUM-VITAMIN D PO Take 600 mg by mouth 2 (two) times daily.    . cholecalciferol (VITAMIN D) 1000 units tablet Take 1,000 Units by mouth daily.    . fenofibrate micronized (LOFIBRA) 134 MG capsule Take 134 mg by mouth daily before breakfast.    . Glucosamine Sulfate 1000 MG CAPS Take 1,000 mg by mouth daily.    . hydrOXYzine (ATARAX/VISTARIL) 50 MG tablet Take 1 tablet by mouth 4 (  four) times daily as needed.    . isosorbide mononitrate (IMDUR) 30 MG 24 hr tablet Take 30 mg by mouth daily.    . Lactobacillus (PROBIOTIC ACIDOPHILUS PO) Take by mouth daily.    Marland Kitchen latanoprost (XALATAN) 0.005 % ophthalmic solution Place 1 drop into both eyes at bedtime.    . Misc Natural Products (ADV TURMERIC CURCUMIN COMPLEX PO) Take by mouth.    Edyth Gunnels Oleifera (MORINGA PO) Take 700 mg by mouth 2 (two) times daily.    . nitroGLYCERIN (NITROSTAT) 0.4 MG SL tablet Place 0.4 mg under the tongue every 5 (five) minutes as needed for chest pain.    . Omega-3 Fatty Acids (FISH OIL BURP-LESS) 500 MG CAPS Take 500 mg by mouth 2 (two) times daily.    . tamsulosin (FLOMAX) 0.4 MG CAPS capsule Take 0.4 mg by mouth at bedtime.    . temazepam (RESTORIL) 30 MG  capsule Take 30 mg by mouth at bedtime as needed.    . TRAZODONE HCL PO Take 50 mg by mouth at bedtime.    . triamcinolone cream (TRIDERM) 0.5 % Apply 1 application topically daily.    . vitamin B-12 (CYANOCOBALAMIN) 1000 MCG tablet Take 1,000 mcg by mouth 2 (two) times daily.     No current facility-administered medications for this visit.    Allergies:   Codeine sulfate [codeine]   Social History:  The patient  reports that he has been smoking. He has never used smokeless tobacco. He reports that he does not drink alcohol and does not use drugs.   Family History:  The patient's family history includes Cancer in his mother and sister; Heart disease in his father; Hyperlipidemia in his father; Hypertension in his father; Stroke in his maternal aunt.    ROS:  Please see the history of present illness.   Otherwise, review of systems is positive for none.   All other systems are reviewed and negative.    PHYSICAL EXAM: VS:  BP (!) 130/56   Pulse 63   Ht 5\' 8"  (1.727 m)   Wt 139 lb (63 kg)   SpO2 96%   BMI 21.13 kg/m  , BMI Body mass index is 21.13 kg/m. GEN: Well nourished, well developed, in no acute distress  HEENT: normal  Neck: no JVD, carotid bruits, or masses Cardiac: RRR; no murmurs, rubs, or gallops,no edema  Respiratory:  clear to auscultation bilaterally, normal work of breathing GI: soft, nontender, nondistended, + BS MS: no deformity or atrophy  Skin: warm and dry Neuro:  Strength and sensation are intact Psych: euthymic mood, full affect  EKG:  EKG is ordered today. Personal review of the ekg ordered shows Sinus rhythm, rate 54, right bundle branch block, left anterior fascicular block  Recent Labs: No results found for requested labs within last 8760 hours.    Lipid Panel  No results found for: CHOL, TRIG, HDL, CHOLHDL, VLDL, LDLCALC, LDLDIRECT   Wt Readings from Last 3 Encounters:  01/24/20 139 lb (63 kg)  11/23/19 144 lb 3.2 oz (65.4 kg)  12/29/17 158  lb (71.7 kg)      Other studies Reviewed: Additional studies/ records that were reviewed today include: TTE 12/09/2019 Review of the above records today demonstrates:  1. Left ventricular ejection fraction, by estimation, is 55 to 60%. The  left ventricle has normal function. The left ventricle has no regional  wall motion abnormalities. There is mild concentric left ventricular  hypertrophy. The left ventricular septum  with speckled  pattern suggesting infiltrative disease. Recommend further  testing: NM pyrophosphate scan or Cardiac MR. Left ventricular diastolic  parameters are consistent with Grade II diastolic dysfunction  (pseudonormalization).  2. Right ventricular systolic function is normal. The right ventricular  size is normal. There is normal pulmonary artery systolic pressure.  3. Left atrial size was severely dilated.  4. Right atrial size was severely dilated.  5. The mitral valve is normal in structure. Trivial mitral valve  regurgitation. No evidence of mitral stenosis.  6. The aortic valve is thickened and moderately calcified. Aortic valve  regurgitation is trivial. Mild to moderate aortic valve stenosis.  7. The inferior vena cava is normal in size with greater than 50%  respiratory variability, suggesting right atrial pressure of 3 mmHg.   Cardiac monitor 12/16/2019 personally reviewed Conclusion, bradycardia with frequent sinus pauses longest longest 3.5 seconds in setting of bifascicular heart block.    ASSESSMENT AND PLAN:  1.  Sinus pauses: Wore a cardiac monitor and was diagnosed on that.  He is also having functional bradycardia noted on ECGs.  At this time, we Ashantee Deupree hold off on rate controlling medications.  In talking with the patient, all these episodes were nocturnal.  He has not had any daytime bradycardia and no symptoms of syncope, near syncope, or dizziness.  Due to that, we Kalese Ensz hold off on pacemaker implant.  I Ajeet Casasola also like to get his lung  mass evaluated further.We Kristyl Athens also call his primary physician's office to get the ECG that was done 3 months ago.  2.  Hypertension: Currently well controlled  3.  Hyperlipidemia: Continue atorvastatin  4.  Coronary artery disease status post CABG: No current chest pain  5.  Peripheral arterial disease: Has follow-up with vascular surgery.  Case discussed with primary cardiology  Current medicines are reviewed at length with the patient today.   The patient does not have concerns regarding his medicines.  The following changes were made today:  none  Labs/ tests ordered today include:  Orders Placed This Encounter  Procedures  . EKG 12-Lead     Disposition:   FU with Lataria Courser 6 months  Signed, Lattie Cervi Meredith Leeds, MD  01/24/2020 3:36 PM     [address] [place] [address] [phone] (office) [phone] (fax)

## 2020-01-24 NOTE — Patient Instructions (Signed)
Medication Instructions:  Your physician recommends that you continue on your current medications as directed. Please refer to the Current Medication list given to you today. *If you need a refill on your cardiac medications before your next appointment, please call your pharmacy*   Lab Work: None ordered.  If you have labs (blood work) drawn today and your tests are completely normal, you will receive your results only by: Marland Kitchen MyChart Message (if you have MyChart) OR . A paper copy in the mail If you have any lab test that is abnormal or we need to change your treatment, we will call you to review the results.   Testing/Procedures: None ordered.    Follow-Up: At [facility], you and your health needs are our priority.  As part of our continuing mission to provide you with exceptional heart care, we have created designated Provider Care Teams.  These Care Teams include your primary Cardiologist (physician) and Advanced Practice Providers (APPs -  Physician Assistants and Nurse Practitioners) who all work together to provide you with the care you need, when you need it.  We recommend signing up for the patient portal called "MyChart".  Sign up information is provided on this After Visit Summary.  MyChart is used to connect with patients for Virtual Visits (Telemedicine).  Patients are able to view lab/test results, encounter notes, upcoming appointments, etc.  Non-urgent messages can be sent to your provider as well.   To learn more about what you can do with MyChart, go to [email or web address].    Your next appointment:   6 month(s)  The format for your next appointment:   In Person  Provider:   Allegra Lai, MD

## 2020-02-14 ENCOUNTER — Encounter: Payer: Self-pay | Admitting: Internal Medicine

## 2020-02-14 ENCOUNTER — Other Ambulatory Visit: Payer: Self-pay

## 2020-02-14 ENCOUNTER — Institutional Professional Consult (permissible substitution) (INDEPENDENT_AMBULATORY_CARE_PROVIDER_SITE_OTHER): Payer: Medicare Other | Admitting: Cardiothoracic Surgery

## 2020-02-14 ENCOUNTER — Other Ambulatory Visit: Payer: Self-pay | Admitting: Cardiothoracic Surgery

## 2020-02-14 VITALS — BP 155/68 | HR 92 | Ht 67.0 in | Wt 135.0 lb

## 2020-02-14 DIAGNOSIS — I70213 Atherosclerosis of native arteries of extremities with intermittent claudication, bilateral legs: Secondary | ICD-10-CM

## 2020-02-14 DIAGNOSIS — R911 Solitary pulmonary nodule: Secondary | ICD-10-CM

## 2020-02-14 NOTE — Progress Notes (Signed)
[address]       [place] [address]             [phone]                    Haskel A Dubberly Curtiss Medical Record #[id number] Date of Birth: 1940/08/13  Referring: Nicoletta Dress, MD Primary Care: Nicoletta Dress, MD Primary Cardiologist: Shirlee More, MD  Chief Complaint: Lung nodule   History of Present Illness:    Brian Yates 79 y.o. male is seen in the office  today for evaluation of a left upper lobe lung nodule.  Patient notes that he is had episodes of bronchitis and pneumonia over the past several years.  Recent chest x-ray suggested lung nodule follow-up CT of the chest and PET scan have been done.  Patient notes that he is been smoking for more than 60 years and continues to smoke, his underlying functional status is noted that he is unable to climb stairs can walk less than 100 yards, this is partly due to neuropathy and claudication in addition to shortness of breath.   Previous history of significant 7 unit GI bleed in 2016-per the patient's history it is not clear what the origin of this was but has not recurred   He has a history of atrial fibrillation, bifascicular block, coronary artery disease, COPD, hepatitis B, hypertension, hyperlipidemia.  He also has peripheral arterial disease and is limited in walking of about 50 to 70 yards on a flat surface.  He presented to his primary care's office in junctional rhythm.  His heart rates were in the high 40s to 50s at the time.  He wore cardiac monitor that showed sinus rhythm with episodic sinus pauses.    Current Activity/ Functional Status:  Patient is independent with mobility/ambulation, transfers, ADL's, IADL's.   Zubrod Score: At the time of surgery this patient's most appropriate activity status/level should be described as: []     0    Normal activity, no symptoms []     1    Restricted in physical strenuous activity but ambulatory, able to do out light work [x]     2     Ambulatory and capable of self care, unable to do work activities, up and about               >50 % of waking hours                              []     3    Only limited self care, in bed greater than 50% of waking hours []     4    Completely disabled, no self care, confined to bed or chair []     5    Moribund   Past Medical History:  Diagnosis Date  . Alcohol abuse   . Anemia due to gastrointestinal blood loss   . AR (allergic rhinitis)   . 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 Cervical disc disease   . Cervical radiculopathy   . COLD (chronic obstructive lung disease) ([place])   . Controlled gout   . Coronary atherosclerosis of native coronary artery   . Diverticulosis   . Diverticulosis of colon with hemorrhage   . Erectile dysfunction   . FH: colon cancer   . Glaucoma   . History of hepatitis B   .  History of skin cancer   . Hx of colonic polyps   . Hyperlipidemia   . Hypertension   . Irritable bowel syndrome   . Mass of jaw   . Neuropathy of left foot   . Previous myocardial infarction older than 8 weeks 1998  . Tobacco abuse   . Tremor   . Vitamin B12 deficiency anemia     Past Surgical History:  Procedure Laterality Date  . CATARACT EXTRACTION Bilateral   . HERNIA REPAIR Bilateral   . LARYNX SURGERY      Family History  Problem Relation Age of Onset  . Hypertension Mother   . Hyperlipidemia Mother   . Colon cancer Mother   . Cancer - Other Mother   . Hypertension Father   . Hyperlipidemia Father   . Heart disease Father   . CAD Father   . Heart attack Father   . Alcohol abuse Father   . Breast cancer Sister   . Stroke Maternal Aunt   . Heart attack Paternal Grandfather   . Heart attack Paternal Uncle    Patient notes that his father had lung disease, and had had a lung resection for tuberculosis  Social History   Tobacco Use  Smoking Status Current Every Day Smoker  . Packs/day: 1.50  Smokeless Tobacco Never Used    Social History    Substance and Sexual Activity  Alcohol Use No     Allergies  Allergen Reactions  . Codeine Sulfate [Codeine]   . Lisinopril Other (See Comments)    otther  . Valsartan Other (See Comments)    other    Current Outpatient Medications  Medication Sig Dispense Refill  . albuterol (VENTOLIN HFA) 108 (90 Base) MCG/ACT inhaler Inhale 2 puffs into the lungs in the morning, at noon, in the evening, and at bedtime.    Marland Kitchen aspirin EC 81 MG tablet Take 81 mg by mouth daily. Swallow whole.    Marland Kitchen atorvastatin (LIPITOR) 40 MG tablet Take 20 mg by mouth daily.     . brimonidine (ALPHAGAN P) 0.1 % SOLN Place 1 drop into both eyes daily.    . candesartan (ATACAND) 16 MG tablet Take 16 mg by mouth daily.    Marland Kitchen dicyclomine (BENTYL) 20 MG tablet Take 20 mg by mouth in the morning, at noon, in the evening, and at bedtime.    . fenofibrate micronized (LOFIBRA) 134 MG capsule Take 134 mg by mouth daily before breakfast.    . furosemide (LASIX) 20 MG tablet Take 20 mg by mouth daily.    . hydrOXYzine (ATARAX/VISTARIL) 50 MG tablet Take 1 tablet by mouth 4 (four) times daily as needed.    . isosorbide mononitrate (IMDUR) 30 MG 24 hr tablet Take 30 mg by mouth daily.    Marland Kitchen latanoprost (XALATAN) 0.005 % ophthalmic solution Place 1 drop into both eyes at bedtime.    . Misc Natural Products (ADV TURMERIC CURCUMIN COMPLEX PO) Take by mouth.    Edyth Gunnels Oleifera (MORINGA PO) Take 700 mg by mouth 2 (two) times daily.    . nitroGLYCERIN (NITROSTAT) 0.4 MG SL tablet Place 0.4 mg under the tongue every 5 (five) minutes as needed for chest pain.    . Omega-3 Fatty Acids (FISH OIL BURP-LESS) 500 MG CAPS Take 500 mg by mouth 2 (two) times daily.    . tamsulosin (FLOMAX) 0.4 MG CAPS capsule Take 0.4 mg by mouth at bedtime.    . temazepam (RESTORIL) 30 MG capsule Take  30 mg by mouth at bedtime as needed.    . TRAZODONE HCL PO Take 50 mg by mouth at bedtime.    . triamcinolone cream (TRIDERM) 0.5 % Apply 1 application  topically daily.    . vitamin B-12 (CYANOCOBALAMIN) 1000 MCG tablet Take 1,000 mcg by mouth 2 (two) times daily.     No current facility-administered medications for this visit.    Pertinent items are noted in HPI.   Review of Systems:     Cardiac Review of Systems: [Y] = yes  or   [ N ] = no   Chest Pain [ n   ]  Resting SOB Blue.Reese   ] Exertional SOB  [ y ]  Orthopnea [ n ]   Pedal Edema [  n ]    Palpitations Blue.Reese  ] Syncope  [ n ]   Presyncope [n   ]   General Review of Systems: [Y] = yes [  ]=no Constitional: recent weight change [  ];  Wt loss over the last 3 months [   ] anorexia [  ]; fatigue [  ]; nausea [  ]; night sweats [  ]; fever [  ]; or chills [  ];           Eye : blurred vision [  ]; diplopia [   ]; vision changes [  ];  Amaurosis fugax[  ]; Resp: cough Blue.Reese  ];  wheezing[ y ];  hemoptysis[  ]; shortness of breath[ y ]; paroxysmal nocturnal dyspnea[  ]; dyspnea on exertion[ y ]; or orthopnea[  ];  GI:  gallstones[  ], vomiting[  ];  dysphagia[  ]; melena[  ];  hematochezia [  ]; heartburn[  ];   Hx of  Colonoscopy[  ]; GU: kidney stones [  ]; hematuria[  ];   dysuria [  ];  nocturia[  ];  history of     obstruction [  ]; urinary frequency [  ]             Skin: rash, swelling[  ];, hair loss[  ];  peripheral edema[  ];  or itching[  ]; Musculosketetal: myalgias[  ];  joint swelling[  ];  joint erythema[  ];  joint pain[  ];  back pain[  ];  Heme/Lymph: bruising[  ];  bleeding[  ];  anemia[  ];  Neuro: TIA[  ];  headaches[  ];  stroke[  ];  vertigo[  ];  seizures[  ];   paresthesias[  ];  difficulty walking[y  ];  Psych:depression[  ]; anxiety[  ];  Endocrine: diabetes[  ];  thyroid dysfunction[  ];  Immunizations: Flu up to date Blue.Reese  ]; Pneumococcal up to date [ y ];covid  Done x 2  Other:     PHYSICAL EXAMINATION: BP (!) 155/68   Pulse 92   Ht 5\' 7"  (1.702 m)   Wt 135 lb (61.2 kg)   SpO2 96%   BMI 21.14 kg/m  General appearance: alert, cooperative, cachectic and no  distress Head: Normocephalic, without obvious abnormality, atraumatic Neck: no adenopathy, no carotid bruit, no JVD, supple, symmetrical, trachea midline and thyroid not enlarged, symmetric, no tenderness/mass/nodules Lymph nodes: Cervical, supraclavicular, and axillary nodes normal. Resp: rhonchi bilaterally Cardio: regular rate and rhythm, S1, S2 normal, no murmur, click, rub or gallop GI: soft, non-tender; bowel sounds normal; no masses,  no organomegaly Extremities: extremities normal, atraumatic, no cyanosis or edema and Homans sign is  negative, no sign of DVT Neurologic: Grossly normal  Diagnostic Studies & Laboratory data:     Recent Radiology Findings:  CLINICAL DATA: Cough and congestion 2 weeks. Smoker.  EXAM: CHEST - 2 VIEW  COMPARISON: 06/03/2015  FINDINGS: Lungs are adequately inflated without focal airspace consolidation or effusion. There is a 1.4 cm irregular nodular density over the left midlung. Cardiomediastinal silhouette is within normal. Mild degenerate change of the spine.  IMPRESSION: 1. No acute cardiopulmonary disease.  2. 1.4 cm nodular density over the left midlung concerning for malignancy. Recommend contrast-enhanced chest CT for further evaluation.   Electronically Signed By: Marin Olp M.D. On: 01/21/2020 09:01  CLINICAL DATA: Follow-up pulmonary nodule. Recent abnormal chest radiograph.  EXAM: CT CHEST WITH CONTRAST  TECHNIQUE: Multidetector CT imaging of the chest was performed during intravenous contrast administration.  CONTRAST: 60 cc of Isovue 370.  COMPARISON: Chest radiograph dated 01/19/2020.  FINDINGS: Cardiovascular: Heart size appears within normal limits. Aortic atherosclerosis. Increase caliber of the main pulmonary artery measuring 3.4 cm is noted which may reflect underlying PA hypertension. Lad, left circumflex and RCA coronary artery calcifications noted.  Mediastinum/Nodes: Small subcentimeter thyroid  nodules are identified within the left lobe. Not clinically significant; no follow-up imaging recommended (ref: J Am Coll Radiol. 2015 Feb;12(2): 143-50). The trachea appears patent and is midline. Normal appearance of the esophagus. No enlarged supraclavicular, axillary, mediastinal, or hilar lymph nodes.  Lungs/Pleura: Moderate to severe centrilobular emphysema. Spiculated nodule is identified within the left upper lobe measuring 2.1 cm, image 58/4 and corresponds to the chest radiograph abnormality. Small nonspecific nodule within the posterior right lower lobe measures 5 mm, image 83/4. No pleural effusion, airspace consolidation, or atelectasis.  Upper Abdomen: Multiple bilateral kidney cysts are identified. Bilateral perinephric fat stranding is noted which may be related to remote insult. Aortic atherosclerosis identified.  Musculoskeletal: No acute or suspicious osseous lesions.  IMPRESSION: 1. Spiculated nodule within the left upper lobe is identified measuring up to 2.1 cm and is worrisome for primary bronchogenic carcinoma. Further evaluation with PET-CT and/or referral to thoracic surgery or pulmonary medicine. 2. Nonspecific 5 mm right lower lobe lung nodule is identified. Attention on follow-up imaging is advised. 3. Emphysema and aortic atherosclerosis. 4. Multi vessel coronary artery calcifications. 5. Increase caliber of the main pulmonary artery may reflect underlying PA hypertension. 6. Bilateral kidney cysts.  Aortic Atherosclerosis (ICD10-I70.0) and Emphysema (ICD10-J43.9).   Electronically Signed By: Kerby Moors M.D. On: 01/27/2020 15:35  CLINICAL DATA: Initial treatment strategy for solitary pulmonary nodule.  EXAM: NUCLEAR MEDICINE PET SKULL BASE TO THIGH  TECHNIQUE: 17.48 mCi F-18 FDG was injected intravenously. Full-ring PET imaging was performed from the skull base to thigh after the radiotracer. CT data was obtained and used for  attenuation correction and anatomic localization.  Fasting blood glucose: 94 mg/dl  COMPARISON: CT chest of January 27, 2020  FINDINGS: Mediastinal blood pool activity: SUV max 1.56  Liver activity: SUV max NA  NECK: No hypermetabolic lymph nodes in the neck.  Incidental CT findings: Extensive calcified atheromatous plaque in the carotid arteries particularly at the carotid bulb.  CHEST: Small area of hypodensity near the thoracic inlet just posterior to the LEFT thyroid lobe measuring 1.6 x 0.9 cm appears to extend from the LEFT thyroid. (SUVmax = 2.2)  Area of concern in the LEFT chest in the LEFT upper lobe measuring approximately 14 x 14 mm, grossly unchanged compared to the previous study, assessment with respect to measurement limited by respiratory motion.  Part solid nodule (SUVmax = 1.6)  5 mm nodule in the RIGHT lower lobe is unchanged and without increased FDG uptake.  Incidental CT findings: Marked generalized atherosclerosis. Calcification of aortic and mitral valve mitral valve annulus. No pericardial effusion. Three-vessel coronary artery disease. No aneurysmal dilation of the thoracic aorta.  Pulmonary emphysema worse at the lung apices as before. Airways are patent.  ABDOMEN/PELVIS: No abnormal hypermetabolic activity within the liver, pancreas, adrenal glands, or spleen. No hypermetabolic lymph nodes in the abdomen or pelvis. Diffuse FDG uptake within the proximal stomach. More so than other areas of bowel extending through the mid stomach.  Incidental CT findings: Marked calcified atheromatous plaque in the abdominal aorta which is nonaneurysmal.  Small LEFT inguinal hernia contains a loop of bowel or potentially a mesh plug from prior herniorrhaphy but there is no sign of bowel obstruction or acute bowel process. The appendix is normal.  SKELETON: No focal hypermetabolic activity to suggest skeletal metastasis.  Incidental CT findings: Spinal  degenerative changes. No acute or destructive bone process.  IMPRESSION: 1. Mild FDG uptake, not above mediastinal blood pool activity. Morphologic characteristics suspicious for bronchogenic neoplasm, PET uptake while not above blood pool could be seen in the setting of indolent neoplasm. 2. LEFT thyroid nodule greater than 1.5 cm with very low level FDG uptake FDG uptake only slightly above mediastinal blood pool and less than adjacent thyroid.Marland Kitchen Recommend thyroid ultrasound (ref: J Am Coll Radiol. 2015 Feb;12(2): 143-50). 3. Diffuse proximal gastric activity could potentially be related to gastritis and is nonspecific. Correlation with symptoms and endoscopy as warranted. 4. LEFT inguinal hernia may contain a small loop of bowel there is no sign of acute bowel process. 5. Atherosclerosis and pulmonary emphysema.  Aortic Atherosclerosis (ICD10-I70.0) and Emphysema (ICD10-J43.9).   Electronically Signed By: Zetta Bills M.D. On: 02/14/2020 15:24  I have independently reviewed the above radiology studies  and reviewed the findings with the patient.     ECHO: Additional studies/ records that were reviewed today include: TTE 12/09/2019 Review of the above records today demonstrates:  1. Left ventricular ejection fraction, by estimation, is 55 to 60%. The  left ventricle has normal function. The left ventricle has no regional  wall motion abnormalities. There is mild concentric left ventricular  hypertrophy. The left ventricular septum  with speckled pattern suggesting infiltrative disease. Recommend further  testing: NM pyrophosphate scan or Cardiac MR. Left ventricular diastolic  parameters are consistent with Grade II diastolic dysfunction  (pseudonormalization).  2. Right ventricular systolic function is normal. The right ventricular  size is normal. There is normal pulmonary artery systolic pressure.  3. Left atrial size was severely dilated.  4. Right atrial size was  severely dilated.  5. The mitral valve is normal in structure. Trivial mitral valve  regurgitation. No evidence of mitral stenosis.  6. The aortic valve is thickened and moderately calcified. Aortic valve  regurgitation is trivial. Mild to moderate aortic valve stenosis.  7. The inferior vena cava is normal in size with greater than 50%  respiratory variability, suggesting right atrial pressure of 3 mmHg.    Recent Lab Findings: No results found for: WBC, HGB, HCT, PLT, GLUCOSE, CHOL, TRIG, HDL, LDLDIRECT, LDLCALC, ALT, AST, NA, K, CL, CREATININE, BUN, CO2, TSH, INR, GLUF, HGBA1C    Assessment / Plan:   #1 14 x 14 mm left upper lobe nodularity with mild FDG uptake-partially solid nodule, PET scan does not rule out the possibility of indolent slow-growing adenocarcinoma of the lung-if  malignant likely stage I [facility] radiology department-they are unable to supply a super D formatted copy of the CT scan for use with navigation bronchoscopy, will need repeat CT scan if need to do navigation bronchoscopy with biopsy. With the patient's known underlying cardiac disease and severe COPD and limitations in his physical activity primary lung resection would not be considered.  I have discussed with the patient needle biopsy CT directed versus stereotactic radiotherapy.  The patient has investigated the current situation on his own, suggested he may consider proton therapy in [place].  He preferred to consider CT directed needle biopsy of the lesion to obtain a definitive tissue diagnosis as he wanted to avoid general anesthesia.   Nonspecific 5 mm right lower lobe lung nodule is identified.-This point too small to characterize will need follow-up.  I have explained to him that stereotactic radiotherapy can be done in [place].  #2, question of left thyroid nodule on PET scan-work-up per primary care #3 nonspecific if DG uptake in the stomach-patient denies any  specific gastritis symptoms-work-up per primary care #4- Marked generalized atherosclerosis. Calcification of aortic and mitral valve mitral valve annulus. No pericardial effusion. Three-vessel coronary artery disease #5 recent evaluation by cardiology/EP for symptomatic bradycardia-at this point no further intervention  Per the patient's request consult with interventional radiology for CT directed needle biopsy left upper lobe has been obtained. We will plan to see the patient back following biopsy to review results and further referrals as necessary.  Grace Isaac MD      [address] [place] [address] Office [phone]     02/15/2020 9:57 AM

## 2020-02-15 ENCOUNTER — Encounter (HOSPITAL_COMMUNITY): Payer: Self-pay | Admitting: Radiology

## 2020-02-15 NOTE — Progress Notes (Signed)
Brian Yates "Gershon Mussel" Male, 79 y.o., 14-Feb-1941 MRN:  [id number] Phone:  [phone] (H) PCP:  Nicoletta Dress, MD Primary Cvg:  Medicare/Medicare Part A And B  RE: CT Biopsy Received: Today Arne Cleveland, MD  [facility] [place]   CT core LUL nodule   Per [facility].: Assessment / Plan:   #1 14 x 14 mm left upper lobe nodularity with mild FDG uptake-partially solid nodule, PET scan does not rule out the possibility of indolent slow-growing adenocarcinoma of the lung-if malignant likely stage I  Contacted [facility] radiology department-they are unable to supply a super D formatted copy of the CT scan for use with navigation bronchoscopy, will need repeat CT scan if need to do navigation bronchoscopy with biopsy.  With the patient's known underlying cardiac disease and severe COPD and limitations in his physical activity primary lung resection would not be considered. I have discussed with the patient needle biopsy CT directed versus stereotactic radiotherapy. The patient has investigated the current situation on his own, suggested he may consider proton therapy in [place]. He preferred to consider CT directed needle biopsy of the lesion to obtain a definitive tissue diagnosis as he wanted to avoid general anesthesia.    DDH       Previous Messages   ----- Message -----  From: Garth Bigness D  Sent: 02/14/2020  5:43 PM EDT  To: Ir Procedure Requests  Subject: CT Biopsy                     Procedure: CT Biopsy   Reason: Lung nodule, left upper lobe mass   History: CT done at [facility]   Provider: Grace Isaac   Provider Contact: [phone]

## 2020-02-17 ENCOUNTER — Encounter: Payer: Self-pay | Admitting: Internal Medicine

## 2020-02-17 ENCOUNTER — Other Ambulatory Visit: Payer: Self-pay | Admitting: *Deleted

## 2020-02-17 NOTE — Progress Notes (Signed)
The proposed treatment discussed in cancer conference 02/17/20 is for discussion purpose only and is not a binding recommendation.  The patient was not physically seen nor present for their treatment options.  Therefore, final treatment plans cannot be decided.

## 2020-02-21 ENCOUNTER — Other Ambulatory Visit (HOSPITAL_COMMUNITY)
Admission: RE | Admit: 2020-02-21 | Discharge: 2020-02-21 | Disposition: A | Discharge status: Home / Self Care | Payer: Medicare Other | Source: Ambulatory Visit | Attending: Cardiothoracic Surgery | Admitting: Cardiothoracic Surgery

## 2020-02-21 DIAGNOSIS — Z20822 Contact with and (suspected) exposure to covid-19: Secondary | ICD-10-CM | POA: Insufficient documentation

## 2020-02-21 DIAGNOSIS — Z01812 Encounter for preprocedural laboratory examination: Secondary | ICD-10-CM | POA: Diagnosis present

## 2020-02-21 LAB — SARS CORONAVIRUS 2 (TAT 6-24 HRS): SARS Coronavirus 2: NEGATIVE

## 2020-02-22 ENCOUNTER — Other Ambulatory Visit: Payer: Self-pay | Admitting: Physician Assistant

## 2020-02-23 ENCOUNTER — Other Ambulatory Visit: Payer: Self-pay

## 2020-02-23 ENCOUNTER — Ambulatory Visit (HOSPITAL_COMMUNITY)
Admission: RE | Admit: 2020-02-23 | Discharge: 2020-02-23 | Disposition: A | Discharge status: Home / Self Care | Payer: Medicare Other | Source: Ambulatory Visit | Attending: Interventional Radiology | Admitting: Interventional Radiology

## 2020-02-23 ENCOUNTER — Ambulatory Visit (HOSPITAL_COMMUNITY)
Admission: RE | Admit: 2020-02-23 | Discharge: 2020-02-23 | Disposition: A | Discharge status: Home / Self Care | Payer: Medicare Other | Source: Ambulatory Visit | Attending: Cardiovascular Disease | Admitting: Cardiovascular Disease

## 2020-02-23 ENCOUNTER — Ambulatory Visit (HOSPITAL_COMMUNITY)
Admission: RE | Admit: 2020-02-23 | Discharge: 2020-02-23 | Disposition: A | Discharge status: Home / Self Care | Payer: Medicare Other | Source: Ambulatory Visit | Attending: Cardiothoracic Surgery | Admitting: Cardiothoracic Surgery

## 2020-02-23 ENCOUNTER — Other Ambulatory Visit (HOSPITAL_COMMUNITY): Payer: Self-pay | Admitting: Interventional Radiology

## 2020-02-23 DIAGNOSIS — Z8349 Family history of other endocrine, nutritional and metabolic diseases: Secondary | ICD-10-CM | POA: Insufficient documentation

## 2020-02-23 DIAGNOSIS — I10 Essential (primary) hypertension: Secondary | ICD-10-CM | POA: Diagnosis not present

## 2020-02-23 DIAGNOSIS — F17210 Nicotine dependence, cigarettes, uncomplicated: Secondary | ICD-10-CM | POA: Insufficient documentation

## 2020-02-23 DIAGNOSIS — Z885 Allergy status to narcotic agent status: Secondary | ICD-10-CM | POA: Diagnosis not present

## 2020-02-23 DIAGNOSIS — J449 Chronic obstructive pulmonary disease, unspecified: Secondary | ICD-10-CM | POA: Diagnosis not present

## 2020-02-23 DIAGNOSIS — I2510 Atherosclerotic heart disease of native coronary artery without angina pectoris: Secondary | ICD-10-CM | POA: Diagnosis not present

## 2020-02-23 DIAGNOSIS — Z7982 Long term (current) use of aspirin: Secondary | ICD-10-CM | POA: Insufficient documentation

## 2020-02-23 DIAGNOSIS — J95811 Postprocedural pneumothorax: Secondary | ICD-10-CM | POA: Diagnosis not present

## 2020-02-23 DIAGNOSIS — E785 Hyperlipidemia, unspecified: Secondary | ICD-10-CM | POA: Diagnosis not present

## 2020-02-23 DIAGNOSIS — C3412 Malignant neoplasm of upper lobe, left bronchus or lung: Secondary | ICD-10-CM | POA: Insufficient documentation

## 2020-02-23 DIAGNOSIS — Z79899 Other long term (current) drug therapy: Secondary | ICD-10-CM | POA: Diagnosis not present

## 2020-02-23 DIAGNOSIS — R918 Other nonspecific abnormal finding of lung field: Secondary | ICD-10-CM

## 2020-02-23 DIAGNOSIS — N281 Cyst of kidney, acquired: Secondary | ICD-10-CM | POA: Diagnosis not present

## 2020-02-23 DIAGNOSIS — I252 Old myocardial infarction: Secondary | ICD-10-CM | POA: Insufficient documentation

## 2020-02-23 DIAGNOSIS — R911 Solitary pulmonary nodule: Secondary | ICD-10-CM

## 2020-02-23 DIAGNOSIS — I700 Atherosclerosis of aorta: Secondary | ICD-10-CM | POA: Insufficient documentation

## 2020-02-23 DIAGNOSIS — Z888 Allergy status to other drugs, medicaments and biological substances status: Secondary | ICD-10-CM | POA: Insufficient documentation

## 2020-02-23 DIAGNOSIS — Z8249 Family history of ischemic heart disease and other diseases of the circulatory system: Secondary | ICD-10-CM | POA: Insufficient documentation

## 2020-02-23 LAB — CBC
HCT: 38.5 % — ABNORMAL LOW (ref 39.0–52.0)
Hemoglobin: 12.7 g/dL — ABNORMAL LOW (ref 13.0–17.0)
MCH: 29.3 pg (ref 26.0–34.0)
MCHC: 33 g/dL (ref 30.0–36.0)
MCV: 88.9 fL (ref 80.0–100.0)
Platelets: 193 10*3/uL (ref 150–400)
RBC: 4.33 MIL/uL (ref 4.22–5.81)
RDW: 15.3 % (ref 11.5–15.5)
WBC: 11.1 10*3/uL — ABNORMAL HIGH (ref 4.0–10.5)
nRBC: 0 % (ref 0.0–0.2)

## 2020-02-23 LAB — PROTIME-INR
INR: 1 (ref 0.8–1.2)
Prothrombin Time: 12.8 seconds (ref 11.4–15.2)

## 2020-02-23 IMAGING — DX DG CHEST 1V PORT
1 series · 1 of 1 positions shown · non-contrast
Comparison: [DATE] study obtained earlier in the day

CLINICAL DATA: Status post lung biopsy

EXAM:
PORTABLE CHEST 1 VIEW

[chest]
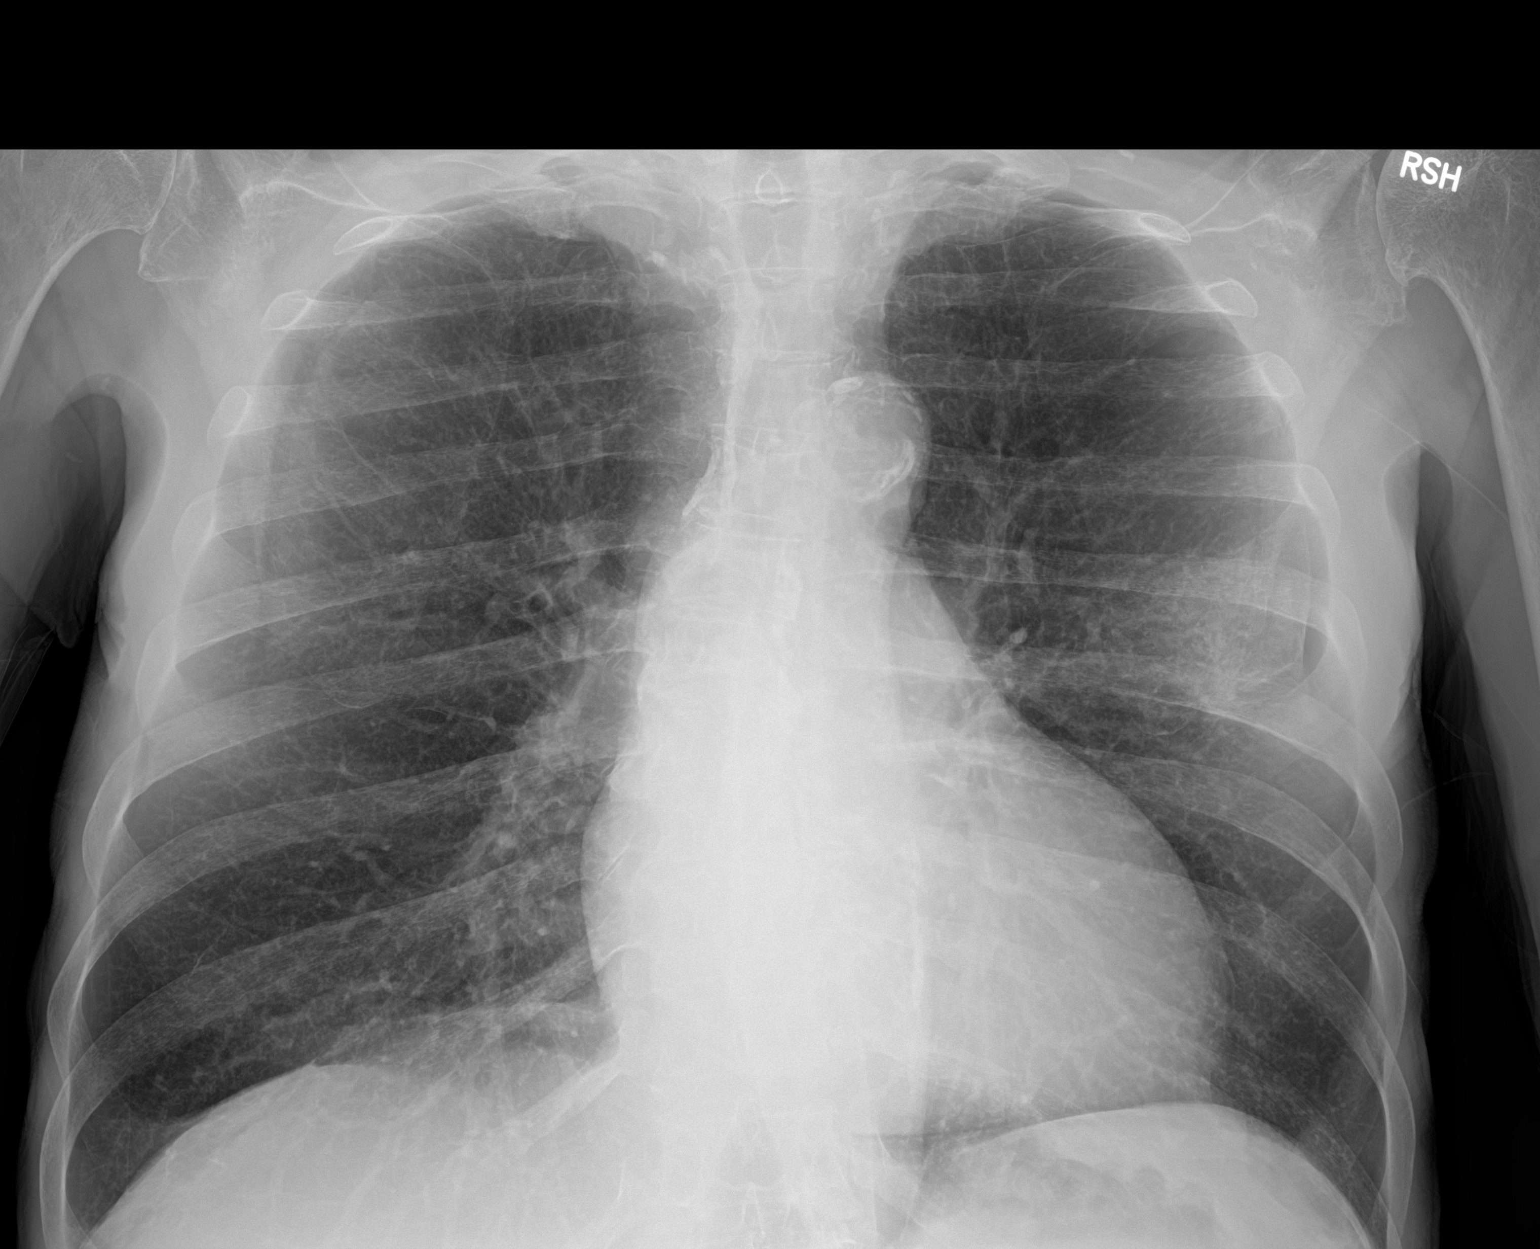

[1 of 1 positions shown; findings below may reference images not displayed]

FINDINGS: There remains a small peripheral pneumothorax near the biopsy site
on the left laterally. There is ill-defined airspace opacity in this
area. Lungs elsewhere are clear. Heart is upper normal in size with
pulmonary vascularity normal. No adenopathy. There is aortic
atherosclerosis.
IMPRESSION: Stable small lateral left pneumothorax near the biopsy site.
Ill-defined airspace opacity at biopsy site in the left mid lung.
Lungs otherwise clear. Stable cardiac silhouette.

Aortic Atherosclerosis ([RK]-[RK]).

## 2020-02-23 IMAGING — CT CT BIOPSY
1 of 3 series · 11 of 32 positions shown, 17 images · non-contrast
Comparison: none

INDICATION: 78-year-old male with a history of FDG avid left upper lobe nodule,
referred for biopsy

[Series 4: i-spiral 5.0 b40f · axial · 0.67mm/px · z∈[+1247,+1401]mm · 11 of 54 slices shown, 17 images]
[im 5/54  soft-tissue]
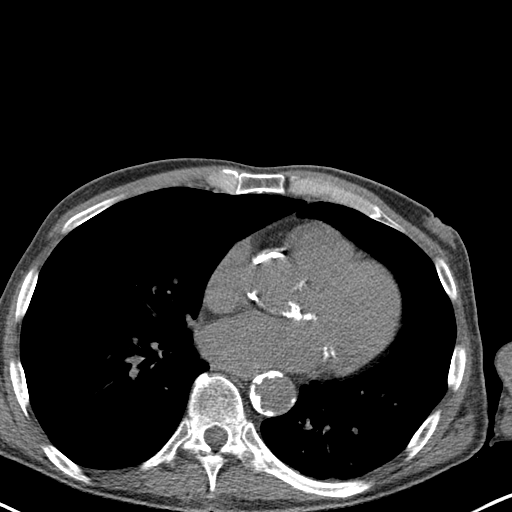
[im 5/54  bone]
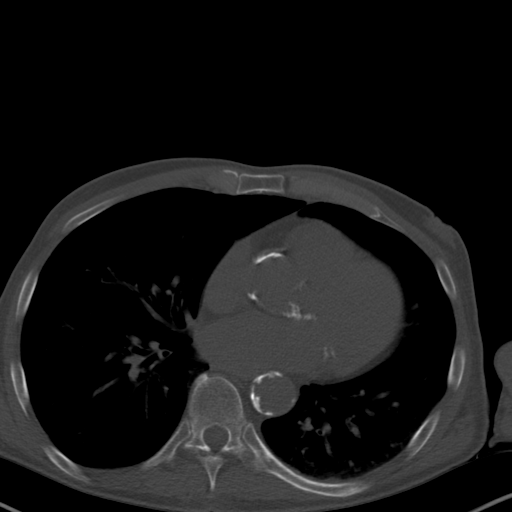
[im 9/54  soft-tissue]
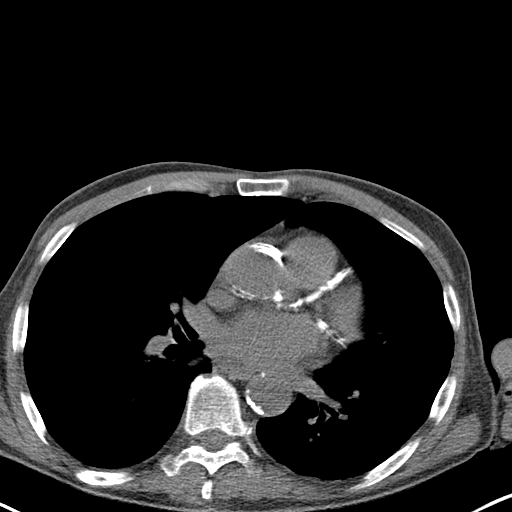
[im 14/54  soft-tissue]
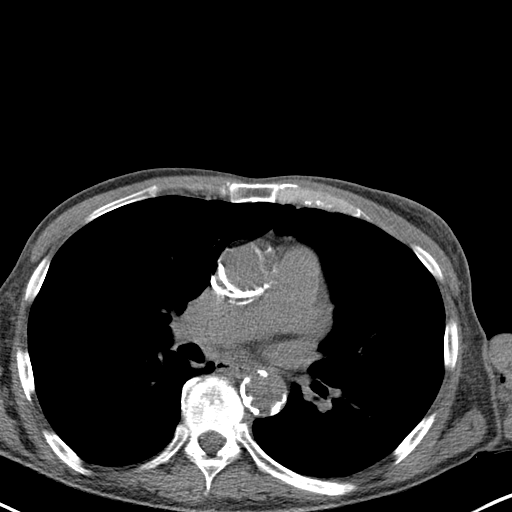
[im 18/54  soft-tissue]
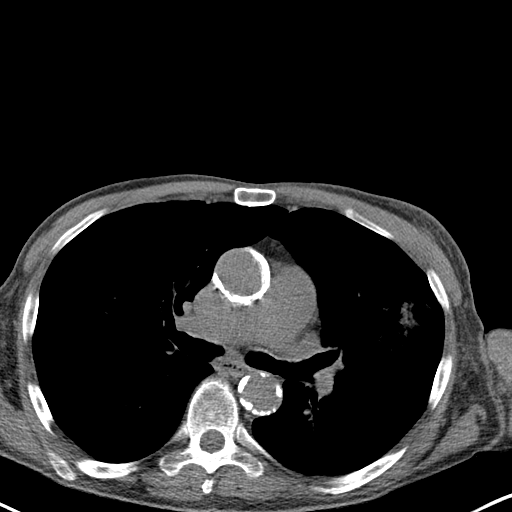
[im 23/54  soft-tissue]
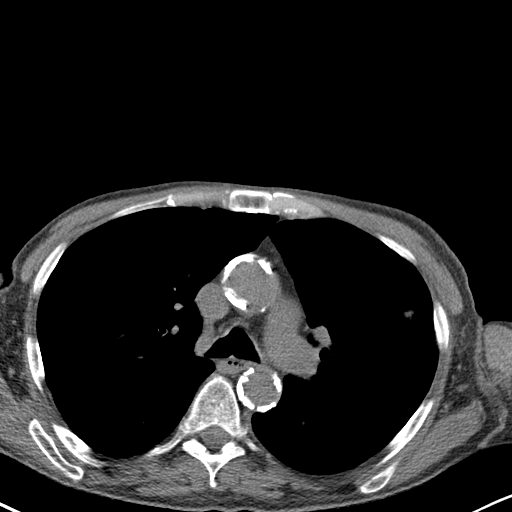
[im 27/54  soft-tissue]
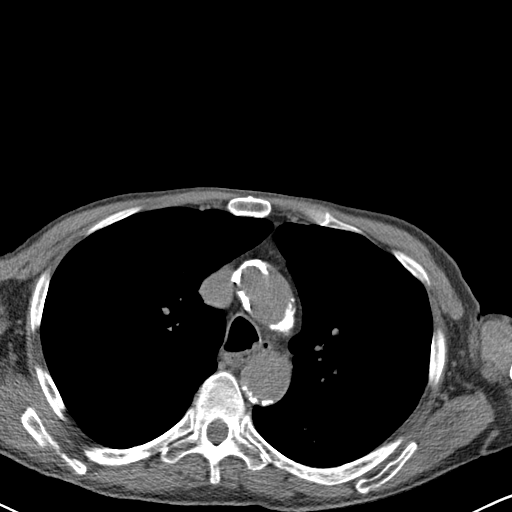
[im 31/54  soft-tissue]
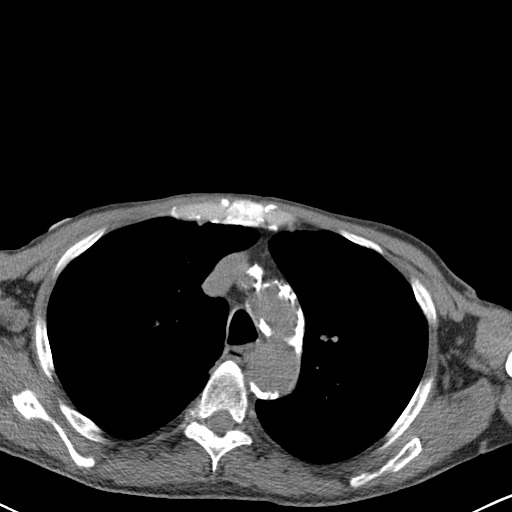
[im 36/54  soft-tissue]
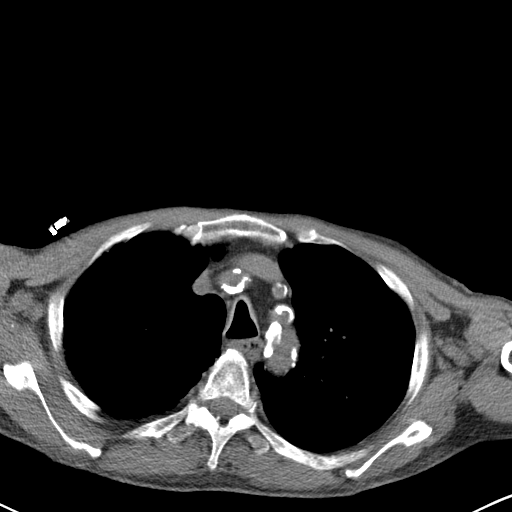
[im 36/54  lung]
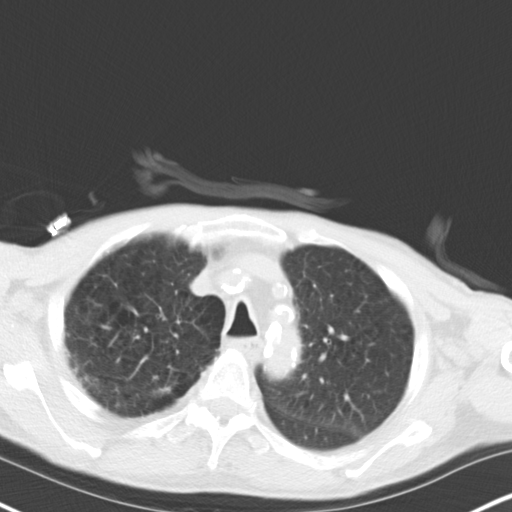
[im 40/54  soft-tissue]
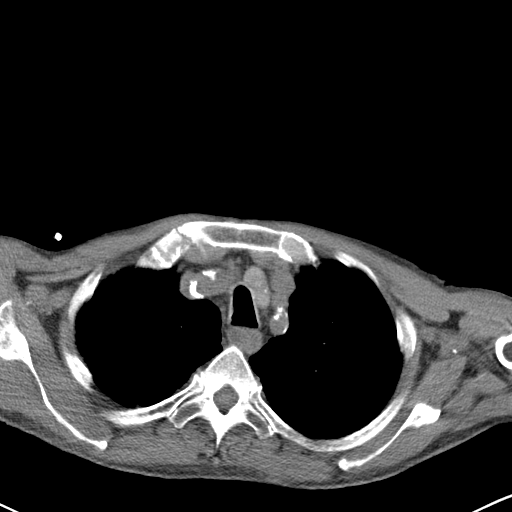
[im 40/54  lung]
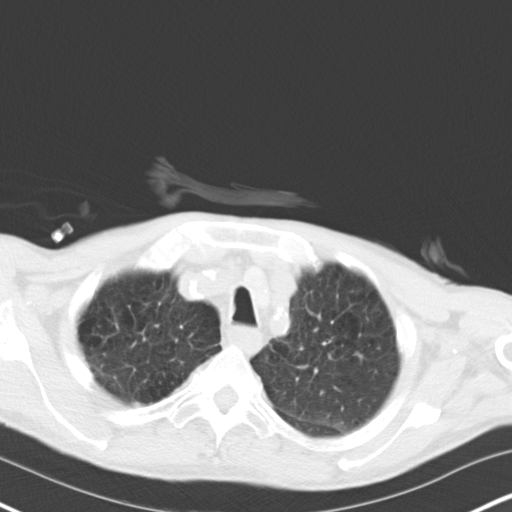
[im 40/54  bone]
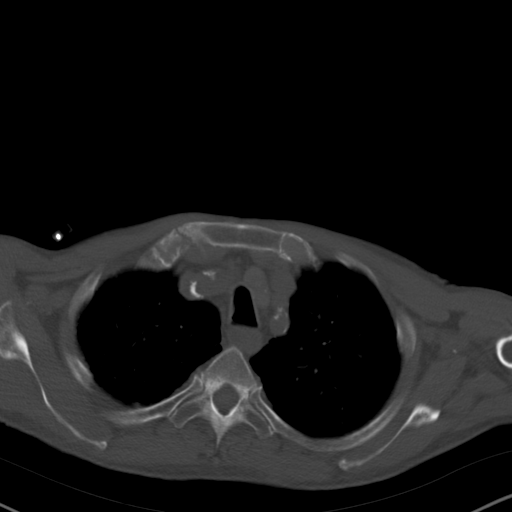
[im 45/54  soft-tissue]
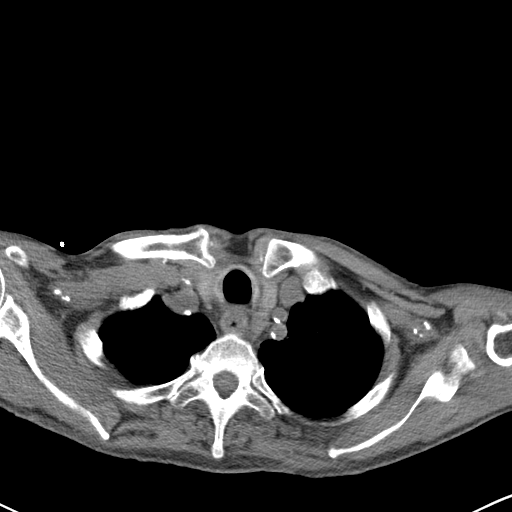
[im 45/54  lung]
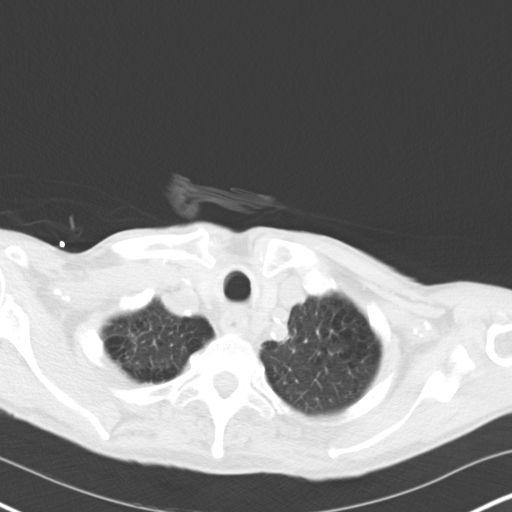
[im 49/54  soft-tissue]
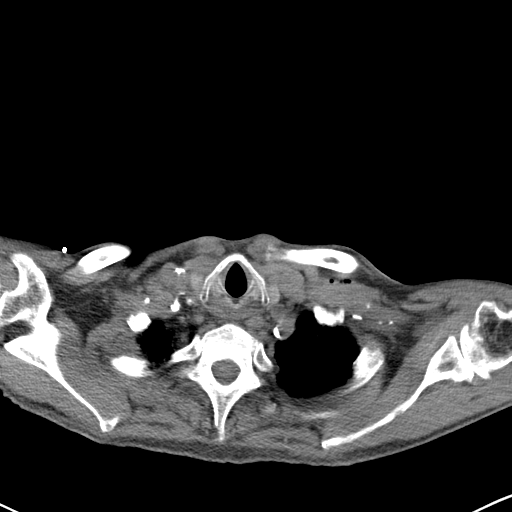
[im 49/54  lung]
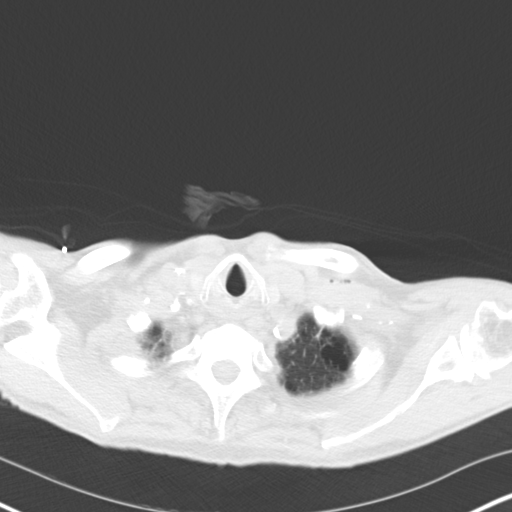

[11 of 32 positions shown; findings below may reference images not displayed]

EXAM:
CT BIOPSY

MEDICATIONS:
None.

ANESTHESIA/SEDATION:
Moderate (conscious) sedation was employed during this procedure. A
total of Versed 1.0 mg and Fentanyl 50 mcg was administered
intravenously.

Moderate Sedation Time: 12 minutes. The patient's level of
consciousness and vital signs were monitored continuously by
radiology nursing throughout the procedure under my direct
supervision.

FLUOROSCOPY TIME:  CT

COMPLICATIONS:
None

PROCEDURE:
The procedure, risks, benefits, and alternatives were explained to
the patient and the patient's family. Specific risks that were
addressed included bleeding, infection, pneumothorax, need for
further procedure including chest tube placement, chance of delayed
pneumothorax or hemorrhage, hemoptysis, nondiagnostic sample,
cardiopulmonary collapse, death. Questions regarding the procedure
were encouraged and answered. The patient understands and consents
to the procedure.

Patient was positioned in the supine position on the CT gantry table
and a scout CT of the chest was performed for planning purposes.

Once angle of approach was determined, the skin and subcutaneous
tissues this scan was prepped and draped in the usual sterile
fashion, and a sterile drape was applied covering the operative
field. A sterile gown and sterile gloves were used for the
procedure. Local anesthesia was provided with 1% Lidocaine.

The skin and subcutaneous tissues were infiltrated 1% lidocaine for
local anesthesia, and a small stab incision was made with an 11
blade scalpel.

Using CT guidance, a 17 gauge trocar needle was advanced into the
left upper lobetarget. After confirmation of the tip, separate 18
gauge core biopsies were performed. These were placed into solution
for transportation to the lab.

Biosentry Device was deployed. A final CT image was performed.

Patient tolerated the procedure well and remained hemodynamically
stable throughout.

No complications were encountered and no significant blood loss was
encounter
IMPRESSION: Status post CT-guided biopsy of left upper lobe nodule.

## 2020-02-23 IMAGING — DX DG CHEST 1V PORT
1 series · 1 of 1 positions shown · non-contrast
Comparison: PA and lateral chest [DATE] and CT chest
[DATE].

CLINICAL DATA: Status post biopsy of a left pulmonary nodule today.

EXAM:
PORTABLE CHEST 1 VIEW

[chest ap]
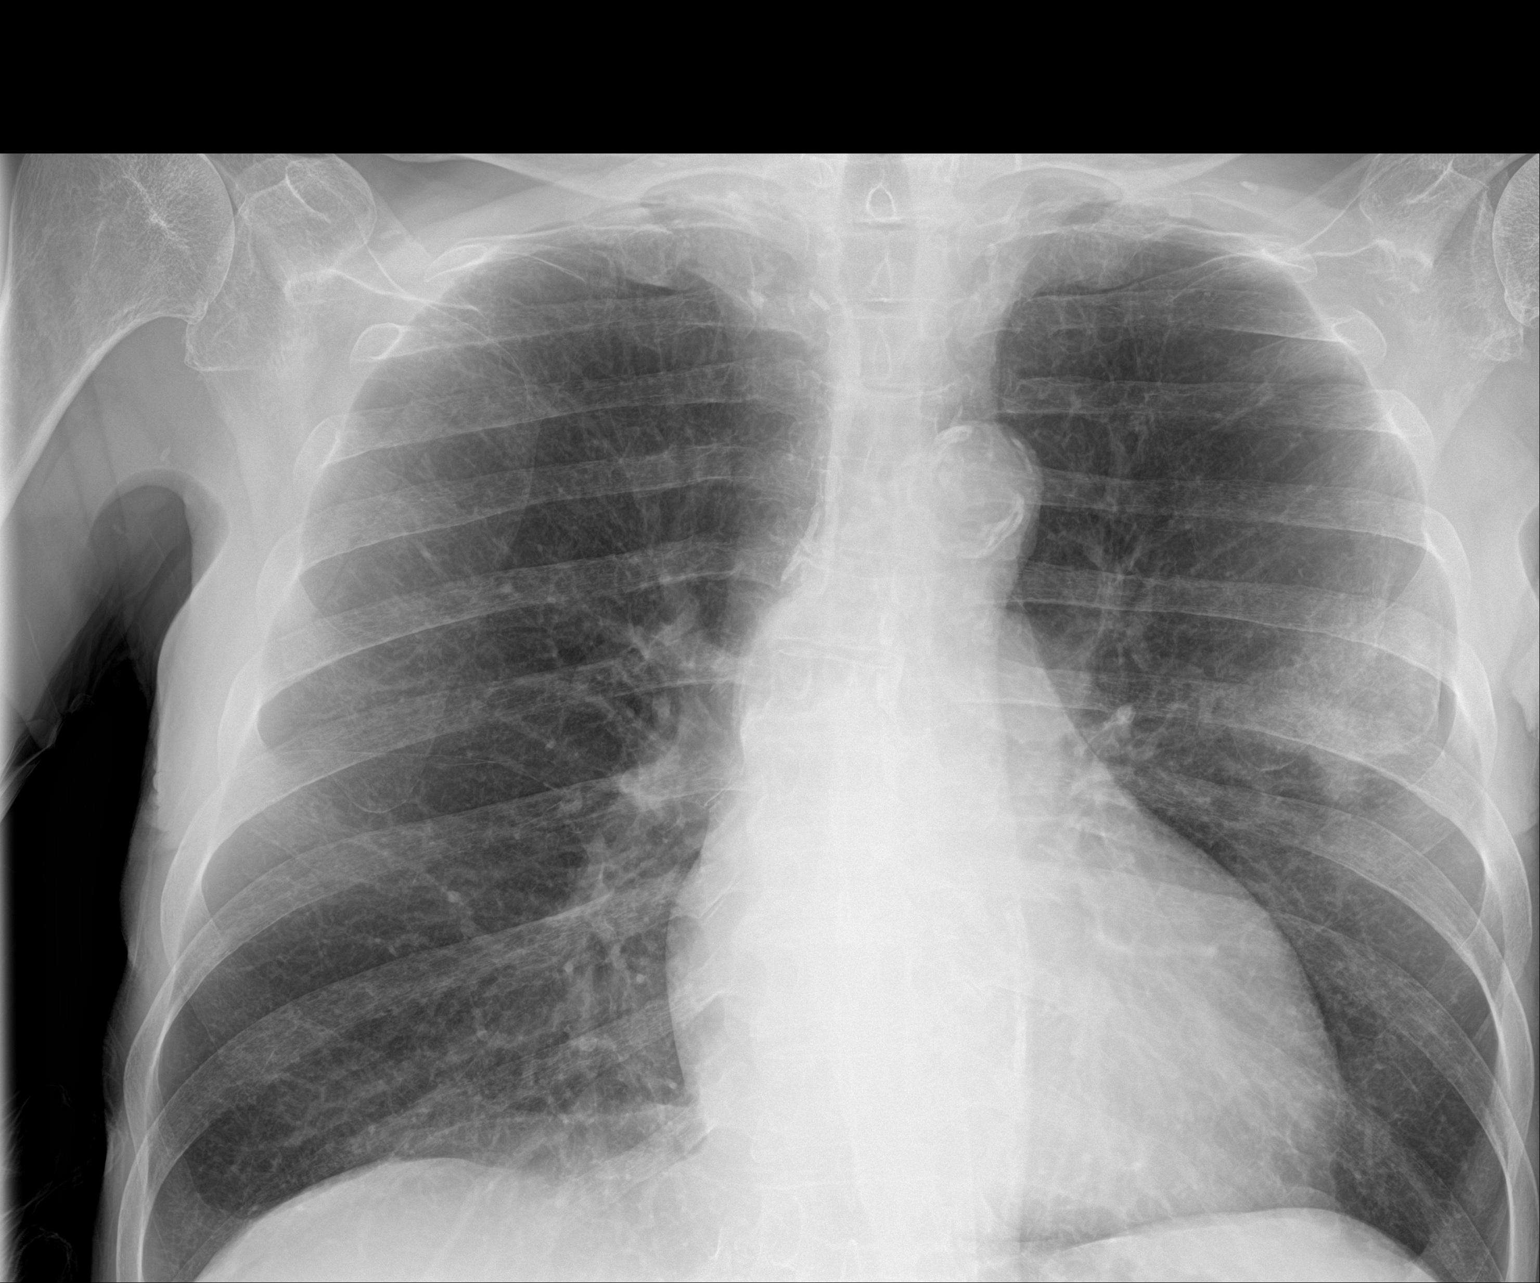

[1 of 1 positions shown; findings below may reference images not displayed]

FINDINGS: The patient has a small peripheral pneumothorax in the left mid lung
after biopsy of the left upper lobe nodule seen on the prior exam.
Hazy opacity about the nodule is consistent with post biopsy
hemorrhage. The lungs are emphysematous. The right lung is expanded
and clear. Aortic atherosclerosis. Heart size is normal.
IMPRESSION: Small left pneumothorax, 5-10%, after biopsy of a left pulmonary
nodule.

Aortic Atherosclerosis ([KM]-[KM]) and Emphysema ([KM]-[KM]).

## 2020-02-23 MED ORDER — FENTANYL CITRATE (PF) 100 MCG/2ML IJ SOLN
INTRAMUSCULAR | Status: AC
  Filled 2020-02-23: qty 2

## 2020-02-23 MED ORDER — FENTANYL CITRATE (PF) 100 MCG/2ML IJ SOLN
INTRAMUSCULAR | Status: AC | PRN
  Administered 2020-02-23: 50 ug via INTRAVENOUS

## 2020-02-23 MED ORDER — MIDAZOLAM HCL 2 MG/2ML IJ SOLN
INTRAMUSCULAR | Status: AC | PRN
  Administered 2020-02-23: 1 mg via INTRAVENOUS

## 2020-02-23 MED ORDER — MIDAZOLAM HCL 2 MG/2ML IJ SOLN
INTRAMUSCULAR | Status: AC
  Filled 2020-02-23: qty 2

## 2020-02-23 MED ORDER — SODIUM CHLORIDE 0.9 % IV SOLN: INTRAVENOUS | Status: DC

## 2020-02-23 NOTE — Progress Notes (Signed)
Patient noted to have small 5-10% pneumothorax post-biopsy. Repeat CXR one hour later shows the pneumothorax to be unchanged and stable. Patient assessed at the bedside. He is  Having no chest pain or shortness of breath. He is 97% on room air. Per Dr. Earleen Newport, patient is stable for discharge.   Please call IR with any questions.  Soyla Dryer, [place] [phone] 02/23/2020, 1:27 PM

## 2020-02-23 NOTE — Discharge Instructions (Signed)
Lung Biopsy, Care After This sheet gives you information about how to care for yourself after your procedure. Your health care provider may also give you more specific instructions depending on the type of biopsy you had. If you have problems or questions, contact your health care provider. What can I expect after the procedure? After the procedure, it is common to have:  A cough.  A sore throat.  Pain where a needle, bronchoscope, or incision was used to collect a biopsy sample (biopsy site). Follow these instructions at home: Medicines  Take over-the-counter and prescription medicines only as told by your health care provider.  Do not drink alcohol if your health care provider tells you not to drink.  Ask your health care provider if the medicine prescribed to you: ? Requires you to avoid driving or using heavy machinery. ? Can cause constipation. You may need to take these actions to prevent or treat constipation:  Drink enough fluid to keep your urine pale yellow.  Take over-the-counter or prescription medicines.  Eat foods that are high in fiber, such as beans, whole grains, and fresh fruits and vegetables.  Limit foods that are high in fat and processed sugars, such as fried or sweet foods.  Do not drive for 24 hours if you were given a sedative. Biopsy site care   Follow instructions from your health care provider about how to take care of your biopsy site. Make sure you: ? Wash your hands with soap and water before and after you change your bandage (dressing). If soap and water are not available, use hand sanitizer. ? Change your dressing as told by your health care provider. ? Leave stitches (sutures), skin glue, or adhesive strips in place. These skin closures may need to stay in place for 2 weeks or longer. If adhesive strip edges start to loosen and curl up, you may trim the loose edges. Do not remove adhesive strips completely unless your health care provider tells  you to do that.  Do not take baths, swim, or use a hot tub until your health care provider approves. Ask your health care provider if you may take showers. You may only be allowed to take sponge baths.  Check your biopsy site every day for signs of infection. Check for: ? Redness, swelling, or more pain. ? Fluid or blood. ? Warmth. ? Pus or a bad smell. General instructions  Return to your normal activities as told by your health care provider. Ask your health care provider what activities are safe for you.  It is up to you to get the results of your procedure. Ask your health care provider, or the department that is doing the procedure, when your results will be ready.  Keep all follow-up visits as told by your health care provider. This is important. Contact a health care provider if:  You have a fever.  You have redness, swelling, or more pain around your biopsy site.  You have fluid or blood coming from your biopsy site.  Your biopsy site feels warm to the touch.  You have pus or a bad smell coming from your biopsy site.  You have pain that does not get better with medicine. Get help right away if:  You cough up blood.  You have trouble breathing.  You have chest pain.  You lose consciousness. Summary  After the procedure, it is common to have a sore throat and a cough.  Return to your normal activities as told by your  health care provider. Ask your health care provider what activities are safe for you.  Take over-the-counter and prescription medicines only as told by your health care provider.  Report any unusual symptoms to your health care provider. This information is not intended to replace advice given to you by your health care provider. Make sure you discuss any questions you have with your health care provider. Document Revised: 07/22/2018 Document Reviewed: 07/16/2016 Elsevier Patient Education  [place].

## 2020-02-23 NOTE — H&P (Signed)
Chief Complaint: Patient was seen in consultation today for lung nodule biopsy.   Referring Physician(s): Grace Isaac  Supervising Physician: Corrie Mckusick  Patient Status: [facility] - Out-pt    History of Present Illness: Brian Yates is a 79 y.o. male with a medical history significant for HTN, anemia, alcohol and tobacco abuse, and chronic obstructive lung disease. He has had multiple episodes of bronchitis and pneumonia in the last few years and a recent chest x-ray on 01/19/20 identified a 1.4 cm nodular density. Additional imaging was ordered.   CT Chest 01/27/20: IMPRESSION: 1. Spiculated nodule within the left upper lobe is identified measuring up to 2.1 cm and is worrisome for primary bronchogenic carcinoma. Further evaluation with PET-CT and/or referral to thoracic surgery or pulmonary medicine. 2. Nonspecific 5 mm right lower lobe lung nodule is identified. Attention on follow-up imaging is advised. 3. Emphysema and aortic atherosclerosis. 4. Multi vessel coronary artery calcifications. 5. Increase caliber of the main pulmonary artery may reflect underlying PA hypertension. 6. Bilateral kidney cysts.  Interventional Radiology has been asked to evaluate this patient for an image-guided left upper lobe nodule biopsy for further work up and diagnosis. This case has been reviewed and procedure approved by Dr. Vernard Gambles.     Past Medical History:  Diagnosis Date  . Alcohol abuse   . Anemia due to gastrointestinal blood loss   . AR (allergic rhinitis)   . 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 Cervical disc disease   . Cervical radiculopathy   . COLD (chronic obstructive lung disease) ([place])   . Controlled gout   . Coronary atherosclerosis of native coronary artery   . Diverticulosis   . Diverticulosis of colon with hemorrhage   . Erectile dysfunction   . FH: colon cancer   . Glaucoma   . History of hepatitis B   . History of skin cancer   . Hx of  colonic polyps   . Hyperlipidemia   . Hypertension   . Irritable bowel syndrome   . Mass of jaw   . Neuropathy of left foot   . Previous myocardial infarction older than 8 weeks 1998  . Tobacco abuse   . Tremor   . Vitamin B12 deficiency anemia     Past Surgical History:  Procedure Laterality Date  . CATARACT EXTRACTION Bilateral   . HERNIA REPAIR Bilateral   . LARYNX SURGERY      Allergies: Codeine sulfate [codeine], Lisinopril, and Valsartan  Medications: Prior to Admission medications   Medication Sig Start Date End Date Taking? Authorizing Provider  albuterol (VENTOLIN HFA) 108 (90 Base) MCG/ACT inhaler Inhale 2 puffs into the lungs in the morning, at noon, in the evening, and at bedtime.   Yes [provider]  aspirin EC 81 MG tablet Take 81 mg by mouth daily. Swallow whole.   Yes [provider]  atorvastatin (LIPITOR) 40 MG tablet Take 20 mg by mouth every evening.    Yes [provider]  brimonidine (ALPHAGAN P) 0.1 % SOLN Place 1 drop into both eyes in the morning and at bedtime.    Yes [provider]  dicyclomine (BENTYL) 20 MG tablet Take 20 mg by mouth 4 (four) times daily as needed (abdominal issues/gas).    Yes [provider]  fenofibrate micronized (LOFIBRA) 134 MG capsule Take 134 mg by mouth every evening.    Yes [provider]  Glucosamine-Chondroitin (COSAMIN DS PO) Take 2 tablets by mouth  daily. W/Vitamin d3   Yes [provider]  hydrOXYzine (ATARAX/VISTARIL) 50 MG tablet Take 50 mg by mouth at bedtime as needed (sleep).  02/16/18  Yes [provider]  isosorbide mononitrate (IMDUR) 30 MG 24 hr tablet Take 30 mg by mouth daily.   Yes [provider]  latanoprost (XALATAN) 0.005 % ophthalmic solution Place 1 drop into both eyes at bedtime.   Yes [provider]  Misc Natural Products (ADV TURMERIC CURCUMIN COMPLEX PO) Take 2,000 mg by mouth daily. 1000 mg/capsule   Yes  [provider]  Moringa Oleifera (MORINGA PO) Take 1,400 mg by mouth daily.    Yes [provider]  OVER THE COUNTER MEDICATION Take 2 capsules by mouth in the morning and at bedtime. Neuropathy & Nerve Support Supplement with 600 mg Alpha Lipoic Acid   Yes [provider]  Probiotic Product (PROBIOTIC PO) Take 1 capsule by mouth daily.   Yes [provider]  tamsulosin (FLOMAX) 0.4 MG CAPS capsule Take 0.4 mg by mouth at bedtime. 01/20/20  Yes [provider]  temazepam (RESTORIL) 30 MG capsule Take 30 mg by mouth at bedtime as needed for sleep.  10/21/19  Yes [provider]  traZODone (DESYREL) 150 MG tablet Take 150 mg by mouth at bedtime as needed for sleep.   Yes [provider]  triamcinolone cream (KENALOG) 0.1 % Apply 1 application topically 3 (three) times daily as needed (itching.).   Yes [provider]  Javier Docker Oil 500 MG CAPS Take 500 mg by mouth daily.    [provider]  nitroGLYCERIN (NITROSTAT) 0.4 MG SL tablet Place 0.4 mg under the tongue every 5 (five) minutes x 3 doses as needed for chest pain.     [provider]     Family History  Problem Relation Age of Onset  . Hypertension Mother   . Hyperlipidemia Mother   . Colon cancer Mother   . Cancer - Other Mother   . Hypertension Father   . Hyperlipidemia Father   . Heart disease Father   . CAD Father   . Heart attack Father   . Alcohol abuse Father   . Breast cancer Sister   . Stroke Maternal Aunt   . Heart attack Paternal Grandfather   . Heart attack Paternal Uncle     Social History   Socioeconomic History  . Marital status: Married    Spouse name: Not on file  . Number of children: Not on file  . Years of education: Not on file  . Highest education level: Not on file  Occupational History  . Occupation: Retired  Tobacco Use  . Smoking status: Current Every Day Smoker    Packs/day: 1.50  . Smokeless tobacco: Never  Used  Vaping Use  . Vaping Use: Never used  Substance and Sexual Activity  . Alcohol use: No  . Drug use: No  . Sexual activity: Not on file  Other Topics Concern  . Not on file  Social History Narrative  . Not on file   Social Determinants of Health   Financial Resource Strain:   . Difficulty of Paying Living Expenses: Not on file  Food Insecurity:   . Worried About Charity fundraiser in the Last Year: Not on file  . Ran Out of Food in the Last Year: Not on file  Transportation Needs:   . Lack of Transportation (Medical): Not on file  . Lack of Transportation (Non-Medical): Not on  file  Physical Activity:   . Days of Exercise per Week: Not on file  . Minutes of Exercise per Session: Not on file  Stress:   . Feeling of Stress : Not on file  Social Connections:   . Frequency of Communication with Friends and Family: Not on file  . Frequency of Social Gatherings with Friends and Family: Not on file  . Attends Religious Services: Not on file  . Active Member of Clubs or Organizations: Not on file  . Attends Archivist Meetings: Not on file  . Marital Status: Not on file    Review of Systems: A 12 point ROS discussed and pertinent positives are indicated in the HPI above.  All other systems are negative.  Review of Systems  Constitutional: Negative for fatigue.  Respiratory: Positive for cough. Negative for shortness of breath.   Cardiovascular: Negative for chest pain and leg swelling.  Gastrointestinal: Positive for abdominal pain. Negative for diarrhea, nausea and vomiting.       Intermittent pain. Patient has IBS.   Musculoskeletal: Positive for back pain.       Chronic  Neurological: Negative for light-headedness and headaches.  Hematological: Bruises/bleeds easily.       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     Vital Signs: BP (!) 158/56   Pulse 79   Temp 98 F (36.7 C) (Oral)   Resp 18   Ht 5\' 7"  (1.702 m)   Wt 135 lb (61.2 kg)   SpO2 100%    BMI 21.14 kg/m   Physical Exam Constitutional:      General: He is not in acute distress. HENT:     Mouth/Throat:     Mouth: Mucous membranes are moist.     Pharynx: Oropharynx is clear.     Comments: edentulous Cardiovascular:     Rate and Rhythm: Normal rate and regular rhythm.     Pulses: Normal pulses.     Heart sounds: Normal heart sounds.  Pulmonary:     Effort: Pulmonary effort is normal.     Breath sounds: Normal breath sounds.  Abdominal:     General: Bowel sounds are normal.     Palpations: Abdomen is soft.  Skin:    General: Skin is warm and dry.     Findings: Bruising present.     Comments: Generalized/scattered bruising  Neurological:     Mental Status: He is alert and oriented to person, place, and time.     Imaging: No results found.  Labs:  CBC: Recent Labs    02/23/20 0916  WBC 11.1*  HGB 12.7*  HCT 38.5*  PLT 193    COAGS: Recent Labs    02/23/20 0916  INR 1.0    BMP: No results for input(s): NA, K, CL, CO2, GLUCOSE, BUN, CALCIUM, CREATININE, GFRNONAA, GFRAA in the last 8760 hours.  Invalid input(s): CMP  LIVER FUNCTION TESTS: No results for input(s): BILITOT, AST, ALT, ALKPHOS, PROT, ALBUMIN in the last 8760 hours.  TUMOR MARKERS: No results for input(s): AFPTM, CEA, CA199, CHROMGRNA in the last 8760 hours.  Assessment and Plan:  Left upper lobe nodule: Brian Yates, 79 year old male, presents today for an image-guided lung nodule biopsy.   Risks and benefits of CT guided lung nodule biopsy was discussed with the patient including, but not limited to bleeding, hemoptysis, respiratory failure requiring intubation, infection, pneumothorax requiring chest tube placement, stroke from air embolism or even death.  All of the patient's questions were answered  and the patient is agreeable to proceed.  The patient has been NPO. Labs and vitals have been reviewed. The patient does not take any blood thinning medications.   Consent  signed and in chart.  Thank you for this interesting consult.  I greatly enjoyed meeting Brian Yates and look forward to participating in their care.  A copy of this report was sent to the requesting provider on this date.  Electronically Signed: Soyla Dryer, AGACNP-BC [phone] 02/23/2020, 10:35 AM   I spent a total of  30 Minutes   in face to face in clinical consultation, greater than 50% of which was counseling/coordinating care for lung nodule biopsy

## 2020-02-23 NOTE — Procedures (Signed)
Interventional Radiology Procedure Note  Procedure: CT guided biopsy of LUL nodule. Mx 18g core.  Complications: None Recommendations: - Bedrest until CXR cleared.  Minimize talking, coughing or otherwise straining.  - Follow up 1 hr CXR pending  - NPO until CXR cleared  Signed,  Corrie Mckusick, DO

## 2020-02-24 ENCOUNTER — Telehealth: Payer: Self-pay | Admitting: Cardiothoracic Surgery

## 2020-02-24 LAB — SURGICAL PATHOLOGY

## 2020-02-24 NOTE — Telephone Encounter (Signed)
Call patient today after receiving report from pathology. 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 The patient wished to proceed with consultation with radiation therapy to consider stereotactic radiation.  Originally on his visit prior he had expressed a desire to go to [facility] for radiation therapy but he is change his mind and would be glad to be seen at [facility].  We will make the referral today.

## 2020-02-28 ENCOUNTER — Institutional Professional Consult (permissible substitution): Payer: Medicare Other | Admitting: Cardiology

## 2020-03-01 NOTE — Progress Notes (Signed)
Thoracic Location of Tumor / Histology: LUL Lung  Patient presented with persistent cough.  PET 02/14/2020: Mild FDG uptake, not above mediastinal blood pool activity. Morphologic characteristics suspicious for bronchogenic neoplasm, PET uptake while not above blood pool could be seen in the setting of indolent neoplasm.  CT Chest 01/27/2020:Spiculated nodule within the left upper lobe is identified measuring up to 2.1 cm and is worrisome for primary bronchogenic carcinoma. Further evaluation with PET-CT and/or referral to thoracic surgery or pulmonary medicine. Nonspecific 5 mm right lower lobe lung nodule is identified. Attention on follow-up imaging is advised.   Chest xray 01/19/2020: No acute cardiopulmonary disease.  1.4 cm nodular density over the left midlung concerning for malignancy. Recommend contrast-enhanced chest CT for further evaluation.  Biopsies of Left upper Lobe Lung 02/23/2020   Tobacco/Marijuana/Snuff/ETOH use: Current Smoker  Past/Anticipated interventions by cardiothoracic surgery, if any:  Dr. Servando Snare 02/14/2020 -With the patient's known underlying cardiac disease and severe COPD and limitations in his physical activity primary lung resection would not be considered.  -I have discussed with the patient needle biopsy CT directed versus stereotactic radiotherapy.  The patient has investigated the current situation on his own, suggested he may consider proton therapy in [place].   -He preferred to consider CT directed needle biopsy of the lesion to obtain a definitive tissue diagnosis as he wanted to avoid general anesthesia. -Per the patient's request consult with interventional radiology for CT directed needle biopsy left upper lobe has been obtained. We will plan to see the patient back following biopsy to review results and further referrals as necessary. 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 -The patient wished to  proceed with consultation with radiation therapy to consider stereotactic radiation.   Past/Anticipated interventions by medical oncology, if any:     Signs/Symptoms  Weight changes, if any:   Respiratory complaints, if any: None  Hemoptysis, if any: Productive cough with yellow/clear phlegm.  No blood noted.  Pain issues, if any:  No  SAFETY ISSUES:  Prior radiation? No  Pacemaker/ICD? No  Possible current pregnancy? n/a  Is the patient on methotrexate? no  Current Complaints / other details:

## 2020-03-02 ENCOUNTER — Other Ambulatory Visit: Payer: Self-pay | Admitting: *Deleted

## 2020-03-02 NOTE — Progress Notes (Signed)
The proposed treatment discussed in cancer conference is for discussion purpose only and is not a binding recommendation. The patient was not physically examined nor present for their treatment options. Therefore, final treatment plans cannot be decided.  ?

## 2020-03-07 ENCOUNTER — Ambulatory Visit
Admission: RE | Admit: 2020-03-07 | Discharge: 2020-03-07 | Disposition: A | Discharge status: Home / Self Care | Payer: Medicare Other | Source: Ambulatory Visit | Attending: Radiation Oncology | Admitting: Radiation Oncology

## 2020-03-07 ENCOUNTER — Encounter: Payer: Self-pay | Admitting: Radiation Oncology

## 2020-03-07 ENCOUNTER — Other Ambulatory Visit: Payer: Self-pay

## 2020-03-07 DIAGNOSIS — C3412 Malignant neoplasm of upper lobe, left bronchus or lung: Secondary | ICD-10-CM

## 2020-03-07 NOTE — Progress Notes (Signed)
Radiation Oncology         [phone] ________________________________  Initial Outpatient Consultation - Conducted via telephone due to current COVID-19 concerns for limiting patient exposure  I spoke with the patient to conduct this consult visit via telephone to spare the patient unnecessary potential exposure in the healthcare setting during the current COVID-19 pandemic. The patient was notified in advance and was offered a [place] meeting to allow for face to face communication but unfortunately reported that they did not have the appropriate resources/technology to support such a visit and instead preferred to proceed with a telephone consult.   Name: Brian Yates        MRN: [id number]  Date of Service: 03/07/2020 DOB: 01/31/1941  YF:[id number], Lora Havens, MD  Grace Isaac, MD     REFERRING PHYSICIAN: Grace Isaac, MD   DIAGNOSIS: The encounter diagnosis was Malignant neoplasm of upper lobe of left lung ([place]).   HISTORY OF PRESENT ILLNESS: Brian Yates is a 79 y.o. male seen at the request of Dr. Servando Snare for a newly diagnosed adenocarcinoma of the left upper lobe.  The patient had gone for an x-ray of the chest which had revealed a mass in the left upper lobe measuring approximately 14 mm.  Subsequent CT scan revealed a 2.1 cm area of concern in the left upper lobe that corresponded to his x-ray.  PET scan on 02/14/20 revealed hypermetabolic activity in the LUL measuring 1.4 cm with an SUV of 1.6, a separate nodule in the RLL was 5 mm and below PET resolution. A small thyroid nodule was noted in the left lobe measuring 1.6 cm, and had an SUV of 2.2.  He was counseled on the rationale for tissue sampling and on 02/23/2020 underwent a CT-guided biopsy which shows an adenocarcinoma.  His lung and cardiovascular disease limits him from being a good surgical candidate, and he is contacted today by telephone to discuss options of stereotactic body radiotherapy  (SBRT).    PREVIOUS RADIATION THERAPY: No   PAST MEDICAL HISTORY:  Past Medical History:  Diagnosis Date  . Alcohol abuse   . Anemia due to gastrointestinal blood loss   . AR (allergic rhinitis)   . 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 Cervical disc disease   . Cervical radiculopathy   . COLD (chronic obstructive lung disease) ([place])   . Controlled gout   . Coronary atherosclerosis of native coronary artery   . Diverticulosis   . Diverticulosis of colon with hemorrhage   . Erectile dysfunction   . FH: colon cancer   . Glaucoma   . History of hepatitis B   . History of skin cancer   . Hx of colonic polyps   . Hyperlipidemia   . Hypertension   . Irritable bowel syndrome   . Mass of jaw   . Neuropathy of left foot   . Previous myocardial infarction older than 8 weeks 1998  . Tobacco abuse   . Tremor   . Vitamin B12 deficiency anemia        PAST SURGICAL HISTORY: Past Surgical History:  Procedure Laterality Date  . CATARACT EXTRACTION Bilateral   . HERNIA REPAIR Bilateral   . LARYNX SURGERY       FAMILY HISTORY:  Family History  Problem Relation Age of Onset  . Hypertension Mother   . Hyperlipidemia Mother   . Colon cancer Mother   . Cancer - Other Mother   . Hypertension Father   .  Hyperlipidemia Father   . Heart disease Father   . CAD Father   . Heart attack Father   . Alcohol abuse Father   . Breast cancer Sister   . Stroke Maternal Aunt   . Heart attack Paternal Grandfather   . Heart attack Paternal Uncle      SOCIAL HISTORY:  reports that he has been smoking. He has been smoking about 1.50 packs per day. He has never used smokeless tobacco. He reports that he does not drink alcohol and does not use drugs.  The patient lives in [place] and is married. The patient is retired from [employer] and from the [employer].  ALLERGIES: Codeine sulfate [codeine], Lisinopril, and Valsartan   MEDICATIONS:  Current Outpatient Medications   Medication Sig Dispense Refill  . albuterol (VENTOLIN HFA) 108 (90 Base) MCG/ACT inhaler Inhale 2 puffs into the lungs in the morning, at noon, in the evening, and at bedtime.    Marland Kitchen aspirin EC 81 MG tablet Take 81 mg by mouth daily. Swallow whole.    Marland Kitchen atorvastatin (LIPITOR) 40 MG tablet Take 20 mg by mouth every evening.     . brimonidine (ALPHAGAN P) 0.1 % SOLN Place 1 drop into both eyes in the morning and at bedtime.     . dicyclomine (BENTYL) 20 MG tablet Take 20 mg by mouth 4 (four) times daily as needed (abdominal issues/gas).     . fenofibrate micronized (LOFIBRA) 134 MG capsule Take 134 mg by mouth every evening.     . Glucosamine-Chondroitin (COSAMIN DS PO) Take 2 tablets by mouth daily. W/Vitamin d3    . hydrOXYzine (ATARAX/VISTARIL) 50 MG tablet Take 50 mg by mouth at bedtime as needed (sleep).     . isosorbide mononitrate (IMDUR) 30 MG 24 hr tablet Take 30 mg by mouth daily.    Javier Docker Oil 500 MG CAPS Take 500 mg by mouth daily.    Marland Kitchen latanoprost (XALATAN) 0.005 % ophthalmic solution Place 1 drop into both eyes at bedtime.    . Misc Natural Products (ADV TURMERIC CURCUMIN COMPLEX PO) Take 2,000 mg by mouth daily. 1000 mg/capsule    . Moringa Oleifera (MORINGA PO) Take 1,400 mg by mouth daily.     . nitroGLYCERIN (NITROSTAT) 0.4 MG SL tablet Place 0.4 mg under the tongue every 5 (five) minutes x 3 doses as needed for chest pain.     Marland Kitchen OVER THE COUNTER MEDICATION Take 2 capsules by mouth in the morning and at bedtime. Neuropathy & Nerve Support Supplement with 600 mg Alpha Lipoic Acid    . Probiotic Product (PROBIOTIC PO) Take 1 capsule by mouth daily.    . tamsulosin (FLOMAX) 0.4 MG CAPS capsule Take 0.4 mg by mouth at bedtime.    . temazepam (RESTORIL) 30 MG capsule Take 30 mg by mouth at bedtime as needed for sleep.     . traZODone (DESYREL) 150 MG tablet Take 150 mg by mouth at bedtime as needed for sleep.    Marland Kitchen triamcinolone cream (KENALOG) 0.1 % Apply 1 application topically 3  (three) times daily as needed (itching.).     No current facility-administered medications for this encounter.     REVIEW OF SYSTEMS: On review of systems, the patient reports that he is is doing well overall. He denies any chest pain. He does occasionally note shortness of breath and a dry persistent cough, sometimes with yellow/clear phlegm. He does not have fevers, chills, night sweats, unintended weight changes. He denies  any bowel or bladder disturbances, and denies abdominal pain, nausea or vomiting. He denies any new musculoskeletal or joint aches or pains. A complete review of systems is obtained and is otherwise negative.     PHYSICAL EXAM:  Wt Readings from Last 3 Encounters:  02/23/20 135 lb (61.2 kg)  02/14/20 135 lb (61.2 kg)  01/24/20 139 lb (63 kg)     Unable to assess due to encounter type.   ECOG = 1  0 - Asymptomatic (Fully active, able to carry on all predisease activities without restriction)  1 - Symptomatic but completely ambulatory (Restricted in physically strenuous activity but ambulatory and able to carry out work of a light or sedentary nature. For example, light housework, office work)  2 - Symptomatic, <50% in bed during the day (Ambulatory and capable of all self care but unable to carry out any work activities. Up and about more than 50% of waking hours)  3 - Symptomatic, >50% in bed, but not bedbound (Capable of only limited self-care, confined to bed or chair 50% or more of waking hours)  4 - Bedbound (Completely disabled. Cannot carry on any self-care. Totally confined to bed or chair)  5 - Death   Eustace Pen MM, Creech RH, Tormey DC, et al. [phone]). "Toxicity and response criteria of the [facility]". [place] Oncol. 5 (6): 649-55    LABORATORY DATA:  Lab Results  Component Value Date   WBC 11.1 (H) 02/23/2020   HGB 12.7 (L) 02/23/2020   HCT 38.5 (L) 02/23/2020   MCV 88.9 02/23/2020   PLT 193 02/23/2020   No  results found for: NA, K, CL, CO2 No results found for: ALT, AST, GGT, ALKPHOS, BILITOT    RADIOGRAPHY: CT BIOPSY  Result Date: 02/23/2020 INDICATION: 79 year old male with a history of FDG avid left upper lobe nodule, referred for biopsy EXAM: CT BIOPSY MEDICATIONS: None. ANESTHESIA/SEDATION: Moderate (conscious) sedation was employed during this procedure. A total of Versed 1.0 mg and Fentanyl 50 mcg was administered intravenously. Moderate Sedation Time: 12 minutes. The patient's level of consciousness and vital signs were monitored continuously by radiology nursing throughout the procedure under my direct supervision. FLUOROSCOPY TIME:  CT COMPLICATIONS: None PROCEDURE: The procedure, risks, benefits, and alternatives were explained to the patient and the patient's family. Specific risks that were addressed included bleeding, infection, pneumothorax, need for further procedure including chest tube placement, chance of delayed pneumothorax or hemorrhage, hemoptysis, nondiagnostic sample, cardiopulmonary collapse, death. Questions regarding the procedure were encouraged and answered. The patient understands and consents to the procedure. Patient was positioned in the supine position on the CT gantry table and a scout CT of the chest was performed for planning purposes. Once angle of approach was determined, the skin and subcutaneous tissues this scan was prepped and draped in the usual sterile fashion, and a sterile drape was applied covering the operative field. A sterile gown and sterile gloves were used for the procedure. Local anesthesia was provided with 1% Lidocaine. The skin and subcutaneous tissues were infiltrated 1% lidocaine for local anesthesia, and a small stab incision was made with an 11 blade scalpel. Using CT guidance, a 17 gauge trocar needle was advanced into the left upper lobetarget. After confirmation of the tip, separate 18 gauge core biopsies were performed. These were placed into  solution for transportation to the lab. Biosentry Device was deployed. A final CT image was performed. Patient tolerated the procedure well and remained hemodynamically stable throughout. No complications  were encountered and no significant blood loss was encounter IMPRESSION: Status post CT-guided biopsy of left upper lobe nodule. Signed, Dulcy Fanny. Dellia Nims, RPVI [facility] Radiology Electronically Signed   By: Corrie Mckusick D.O.   On: 02/23/2020 13:01   DG CHEST PORT 1 VIEW  Result Date: 02/23/2020 CLINICAL DATA:  Status post lung biopsy EXAM: PORTABLE CHEST 1 VIEW COMPARISON:  February 23, 2020 study obtained earlier in the day FINDINGS: There remains a small peripheral pneumothorax near the biopsy site on the left laterally. There is ill-defined airspace opacity in this area. Lungs elsewhere are clear. Heart is upper normal in size with pulmonary vascularity normal. No adenopathy. There is aortic atherosclerosis. IMPRESSION: Stable small lateral left pneumothorax near the biopsy site. Ill-defined airspace opacity at biopsy site in the left mid lung. Lungs otherwise clear. Stable cardiac silhouette. Aortic Atherosclerosis (ICD10-I70.0). Electronically Signed   By: Lowella Grip III M.D.   On: 02/23/2020 13:13   DG Chest Port 1 View  Result Date: 02/23/2020 CLINICAL DATA:  Status post biopsy of a left pulmonary nodule today. EXAM: PORTABLE CHEST 1 VIEW COMPARISON:  PA and lateral chest 01/19/2020 and CT chest 01/27/2020. FINDINGS: The patient has a small peripheral pneumothorax in the left mid lung after biopsy of the left upper lobe nodule seen on the prior exam. Hazy opacity about the nodule is consistent with post biopsy hemorrhage. 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 The right lung is expanded and clear. Aortic atherosclerosis. Heart size is normal. IMPRESSION: Small left pneumothorax, 5-10%, after biopsy of a left pulmonary nodule. Aortic Atherosclerosis  (ICD10-I70.0) and Emphysema (ICD10-J43.9). Electronically Signed   By: Inge Rise M.D.   On: 02/23/2020 12:26       IMPRESSION/PLAN: 1. Stage IA2, cT1bN0M0, NSCLC, adenocarcinoma of the LUL. Dr. Lisbeth Renshaw discusses the pathology findings and reviews the nature of early lung cancer. Dr. Lisbeth Renshaw recommends stereotactic body radiotherapy (SBRT) to the LUL, and surveillance of the RLL nodule. We discussed the risks, benefits, short, and long term effects of radiotherapy, and the patient is interested in proceeding. Dr. Lisbeth Renshaw discusses the delivery and logistics of radiotherapy and anticipates a course of 3-5 fractions of radiotherapy. He will simulate on Thursday of this week and proceed with treatment thereafter.    Given current concerns for patient exposure during the COVID-19 pandemic, this encounter was conducted via telephone.  The patient has provided two factor identification and has given verbal consent for this type of encounter and has been advised to only accept a meeting of this type in a secure network environment. The time spent during this encounter was 60 minutes including preparation, discussion, and coordination of the patient's care. The attendants for this meeting include Blenda Nicely, RN, Dr. Lisbeth Renshaw, Hayden Pedro  and Janalyn Harder and Margreta Journey.  During the encounter,  Blenda Nicely, RN, Dr. Lisbeth Renshaw, and Hayden Pedro were located at [facility] Radiation Oncology Department.  Arjay A Campau was located at home with his wife Manuela Schwartz.   The above documentation reflects my direct findings during this shared patient visit. Please see the separate note by Dr. Lisbeth Renshaw on this date for the remainder of the patient's plan of care.    Carola Rhine, PAC

## 2020-03-09 ENCOUNTER — Ambulatory Visit
Admission: RE | Admit: 2020-03-09 | Discharge: 2020-03-09 | Disposition: A | Discharge status: Home / Self Care | Payer: Medicare Other | Source: Ambulatory Visit | Attending: Radiation Oncology | Admitting: Radiation Oncology

## 2020-03-09 ENCOUNTER — Other Ambulatory Visit: Payer: Self-pay

## 2020-03-09 DIAGNOSIS — C3412 Malignant neoplasm of upper lobe, left bronchus or lung: Secondary | ICD-10-CM | POA: Insufficient documentation

## 2020-03-09 DIAGNOSIS — Z510 Encounter for antineoplastic radiation therapy: Secondary | ICD-10-CM | POA: Diagnosis present

## 2020-03-10 DIAGNOSIS — Z510 Encounter for antineoplastic radiation therapy: Secondary | ICD-10-CM | POA: Diagnosis not present

## 2020-03-21 ENCOUNTER — Ambulatory Visit
Admission: RE | Admit: 2020-03-21 | Discharge: 2020-03-21 | Disposition: A | Discharge status: Home / Self Care | Payer: Medicare Other | Source: Ambulatory Visit | Attending: Radiation Oncology | Admitting: Radiation Oncology

## 2020-03-21 ENCOUNTER — Other Ambulatory Visit: Payer: Self-pay

## 2020-03-21 DIAGNOSIS — Z510 Encounter for antineoplastic radiation therapy: Secondary | ICD-10-CM | POA: Diagnosis not present

## 2020-03-22 ENCOUNTER — Ambulatory Visit: Payer: Medicare Other

## 2020-03-23 ENCOUNTER — Other Ambulatory Visit: Payer: Self-pay

## 2020-03-23 ENCOUNTER — Ambulatory Visit
Admission: RE | Admit: 2020-03-23 | Discharge: 2020-03-23 | Disposition: A | Discharge status: Home / Self Care | Payer: Medicare Other | Source: Ambulatory Visit | Attending: Radiation Oncology | Admitting: Radiation Oncology

## 2020-03-23 DIAGNOSIS — Z510 Encounter for antineoplastic radiation therapy: Secondary | ICD-10-CM | POA: Diagnosis not present

## 2020-03-28 ENCOUNTER — Encounter: Payer: Self-pay | Admitting: Radiation Oncology

## 2020-03-28 ENCOUNTER — Other Ambulatory Visit: Payer: Self-pay

## 2020-03-28 ENCOUNTER — Ambulatory Visit
Admission: RE | Admit: 2020-03-28 | Discharge: 2020-03-28 | Disposition: A | Discharge status: Home / Self Care | Payer: Medicare Other | Source: Ambulatory Visit | Attending: Radiation Oncology | Admitting: Radiation Oncology

## 2020-03-28 DIAGNOSIS — Z510 Encounter for antineoplastic radiation therapy: Secondary | ICD-10-CM | POA: Diagnosis not present

## 2020-03-28 DIAGNOSIS — C3412 Malignant neoplasm of upper lobe, left bronchus or lung: Secondary | ICD-10-CM

## 2020-03-28 NOTE — Progress Notes (Signed)
Radiation Oncology         [phone]) [phone] ________________________________  Name: Brian Yates MRN: [id number]  Date of Service: 03/28/2020  DOB: 01-27-1941  Post Treatment Note  Diagnosis:  Stage IA2, cT1bN0M0, NSCLC, adenocarcinoma of the LUL.  Interval Since Last Radiation:  Today  03/21/20-03/28/20 SBRT Treatment: The LUL target was treated to 54 Gy in 3 fractions.   Narrative:  The patient was contacted today for routine follow-up. During treatment he did very well with radiotherapy and did not have significant desquamation. He reports that he is doing okay but has had several complaints that he is unsure what to do about.  He reports that he has been having episodes of headaches that are sometimes daily in nature, and seem to be pretty debilitating.  He is also had episodes of nausea vomiting several days within the last week.  This is unrelated necessarily to anything he knows that he ate to give him food poisoning but has also had episodes of significant constipation and alternating diarrhea.  He reports he had to disimpact his colon the other day.  He has a history of a GI bleed several years ago, and has been followed by a GI physician in [place] but reports that the physician is going to be retiring.  He also reports that he has been trying to cut back on smoking and thought that perhaps these were some side effects of this.  He describes claudication in his legs and attributes this to times of feeling weak or not as steady on his feet.  No other complaints are verbalized.  Physical Exam: Vitals were not obtained during the evaluation in retrospect after looking back from nursing. In general this is a well appearing Caucasian male in no acute distress.  He's alert and oriented x4 and appropriate throughout the examination. Cardiopulmonary assessment is negative for acute distress and he exhibits normal effort.  Lower extremities are negative for edema.  He has good strength and tone  and no gross neurological deficits.   Impression/Plan: 1. Stage IA2, cT1bN0M0, NSCLC, adenocarcinoma of the LUL.. The patient has been doing well since completion of radiotherapy.  I recommended that we repeat a scan in 6 weeks time to assess for his response to treatment. 2. GI and CNS complaints.  I looked back on the patient's PET imaging and noted that Dr. Servando Snare had seen some nonspecific findings in the stomach, and recommended that he be evaluated by GI.  Given his history of GI bleed, and his GI symptoms, I think it would be worthwhile for him to be evaluated by GI and with his physician in [place] in the process of retiring, I recommended that we refer him for a fresh perspective on his symptoms and conditions of the past and would send him to [facility] for further evaluation.  He is in agreement with this plan and would like to meet with them.  Regarding his headaches, when reviewing his PET scan it also sounds like he has extensive carotid disease from atherosclerosis.  It would be very unlikely for this to be a picture of it metastatic disease could be present however I think it is worthwhile looking into this further as well, I have ordered an MRI of the brain to rule out metastatic disease to the brain.  Regarding his symptoms however it is much more plausible that he would have vascular disease since it was also described by PET. It does not appear that he has had  any evaluation of his carotid vessels.  I have ordered a CT of the neck with contrast to be done at the same time as his CT of the chest, and will notify Dr. Trula Slade as well who he is seen in the past for claudication.    Carola Rhine, PAC

## 2020-03-29 ENCOUNTER — Other Ambulatory Visit: Payer: Self-pay | Admitting: Radiation Oncology

## 2020-03-29 DIAGNOSIS — C3490 Malignant neoplasm of unspecified part of unspecified bronchus or lung: Secondary | ICD-10-CM

## 2020-03-30 ENCOUNTER — Other Ambulatory Visit: Payer: Self-pay | Admitting: Radiation Oncology

## 2020-03-30 MED ORDER — LORAZEPAM 0.5 MG PO TABS: ORAL_TABLET | ORAL | 0 refills | Status: DC

## 2020-04-03 ENCOUNTER — Other Ambulatory Visit: Payer: Self-pay | Admitting: Cardiology

## 2020-04-03 MED ORDER — NITROGLYCERIN 0.4 MG SL SUBL: 0.4000 mg | SUBLINGUAL_TABLET | SUBLINGUAL | 3 refills | Status: AC | PRN

## 2020-04-03 NOTE — Telephone Encounter (Signed)
*  STAT* If patient is at the pharmacy, call can be transferred to refill team.   1. Which medications need to be refilled? (please list name of each medication and dose if known)  nitroGLYCERIN (NITROSTAT) 0.4 MG SL tablet  2. Which pharmacy/location (including street and city if local pharmacy) is medication to be sent to? [facility] [phone] - [place], [address] AT [place]  3. Do they need a 30 day or 90 day supply? 30 with refills   Patient states the rx he has is expired. Dr. Bettina Gavia has not written an rx for this medication yet

## 2020-04-03 NOTE — Telephone Encounter (Signed)
Refill sent in per request and Dr. Bettina Gavia verbal ok.

## 2020-04-13 ENCOUNTER — Ambulatory Visit (HOSPITAL_COMMUNITY)
Admission: RE | Admit: 2020-04-13 | Discharge: 2020-04-13 | Disposition: A | Discharge status: Home / Self Care | Payer: Medicare Other | Source: Ambulatory Visit | Attending: Radiation Oncology | Admitting: Radiation Oncology

## 2020-04-13 ENCOUNTER — Telehealth: Payer: Self-pay | Admitting: Radiation Oncology

## 2020-04-13 ENCOUNTER — Other Ambulatory Visit: Payer: Self-pay | Admitting: Radiation Therapy

## 2020-04-13 ENCOUNTER — Other Ambulatory Visit: Payer: Self-pay

## 2020-04-13 DIAGNOSIS — C3490 Malignant neoplasm of unspecified part of unspecified bronchus or lung: Secondary | ICD-10-CM

## 2020-04-13 IMAGING — MR MR HEAD WO/W CM
14 series · 48 of 48 positions shown · IV contrast (gadavist)
Comparison: Head CT [DATE].

CLINICAL DATA: Malignant neoplasm of unspecified part of
unspecified bronchus or lung. Non-small cell lung cancer, staging.

EXAM:
MRI HEAD WITHOUT AND WITH CONTRAST
TECHNIQUE: Multiplanar, multiecho pulse sequences of the brain and surrounding
structures were obtained without and with intravenous contrast.
CONTRAST:  6mL GADAVIST GADOBUTROL 1 MMOL/ML IV SOLN

[Series 5: DWI · axial · 3.0mm · 1.36mm/px · z∈[-41,+122]mm · 5 of 112 slices shown (1 of 4)]
[im 1/112]
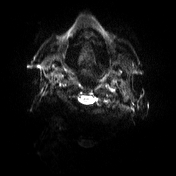
[im 28/112]
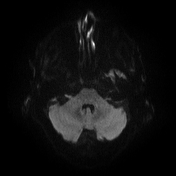
[im 56/112]
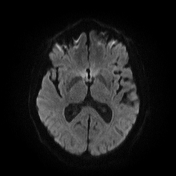
[im 84/112]
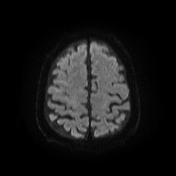
[im 112/112]
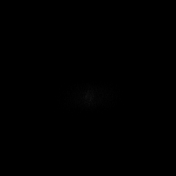

[Series 6: DWI · axial · 3.0mm · 1.36mm/px · z∈[-41,+122]mm · 3 of 55 slices shown (2 of 4)]
[im 1/55]
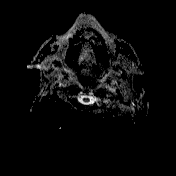
[im 28/55]
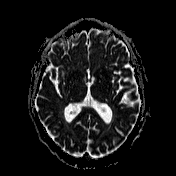
[im 55/55]
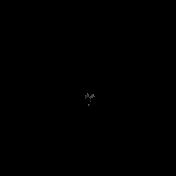

[Series 7: T1 · sagittal · 5.0mm · 0.75mm/px · 1 of 24 slices shown (1 of 2)]
[im 1/24]
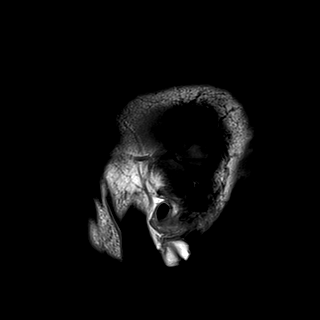

[Series 8: T2 · axial · 5.0mm · 0.62mm/px · 1 of 25 slices shown]
[im 1/25]
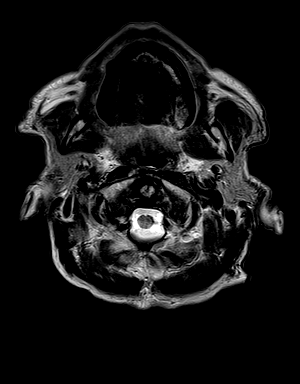

[Series 9: mip_images(sw) · axial · 24.0mm · 0.75mm/px · z∈[-30,+112]mm · 3 of 49 slices shown]
[im 1/49]
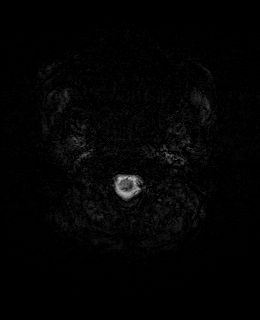
[im 25/49]
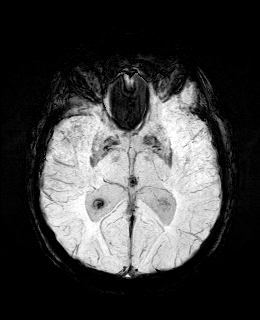
[im 49/49]
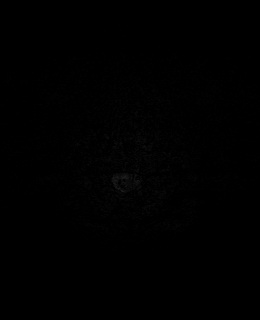

[Series 10: swi_images · axial · 3.0mm · 0.75mm/px · z∈[-40,+123]mm · 3 of 56 slices shown]
[im 1/56]
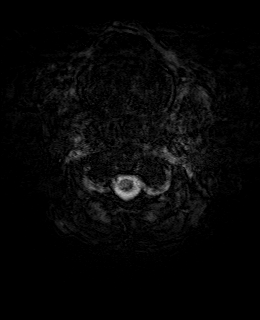
[im 28/56]
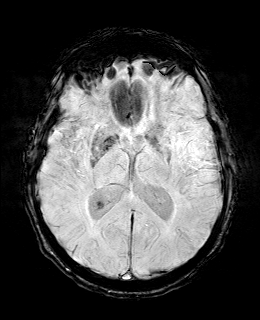
[im 56/56]
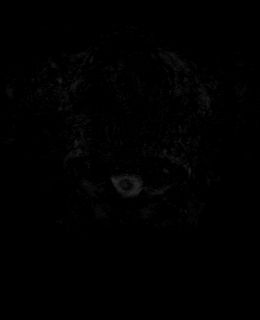

[Series 11: FLAIR · axial · 3.0mm · 0.75mm/px · z∈[-34,+117]mm · 3 of 52 slices shown]
[im 1/52]
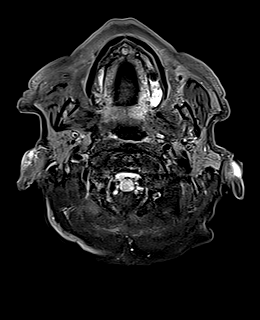
[im 26/52]
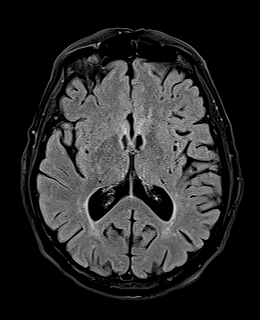
[im 52/52]
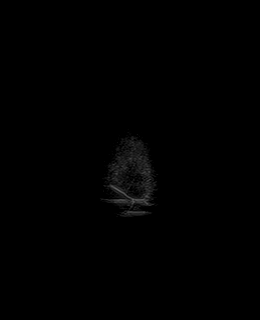

[Series 12: T1 · axial · 1.0mm · 0.94mm/px · z∈[-37,+120]mm · 9 of 160 slices shown (2 of 2)]
[im 1/160]
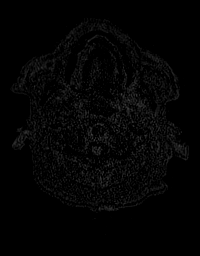
[im 20/160]
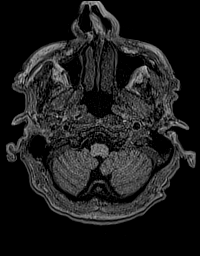
[im 40/160]
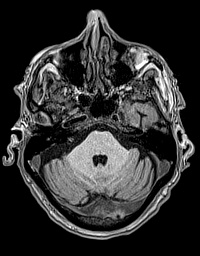
[im 60/160]
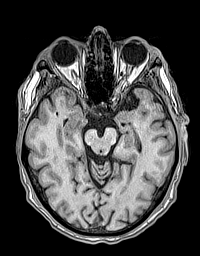
[im 80/160]
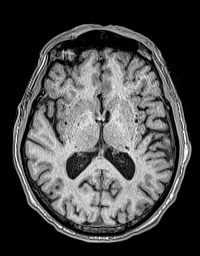
[im 100/160]
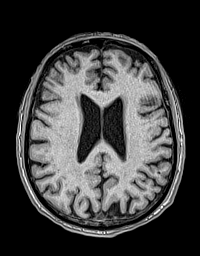
[im 120/160]
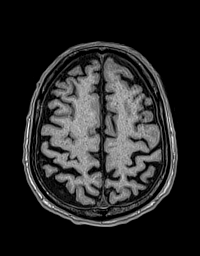
[im 140/160]
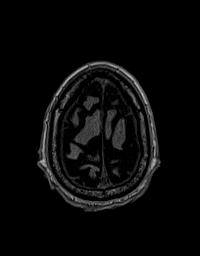
[im 160/160]
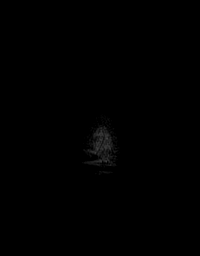

[Series 13: DWI · coronal · 5.0mm · 1.31mm/px · 4 of 72 slices shown (3 of 4)]
[im 1/72]
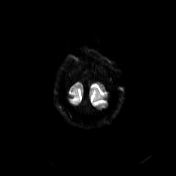
[im 24/72]
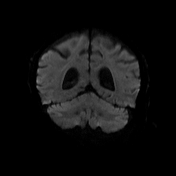
[im 48/72]
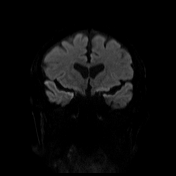
[im 72/72]
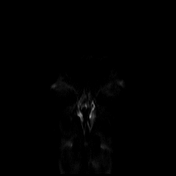

[Series 14: DWI · coronal · 5.0mm · 1.31mm/px · 2 of 36 slices shown (4 of 4)]
[im 1/36]
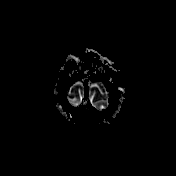
[im 36/36]
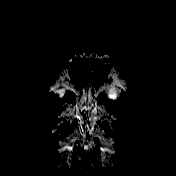

[Series 15: T2 post-contrast · coronal · 5.0mm · 0.57mm/px · 2 of 30 slices shown]
[im 1/30]
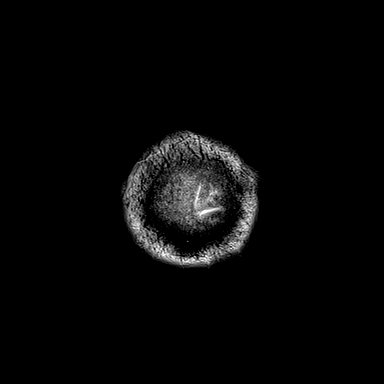
[im 30/30]
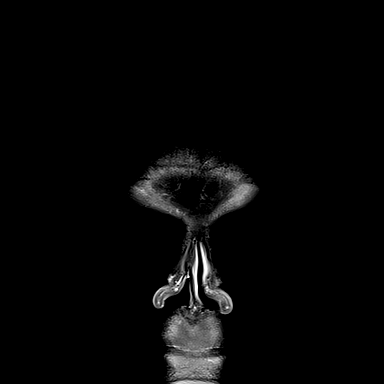

[Series 16: T1 post-contrast · axial · 1.0mm · 0.94mm/px · z∈[-37,+120]mm · 9 of 160 slices shown (1 of 3)]
[im 1/160]
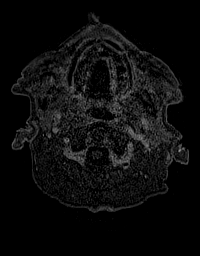
[im 20/160]
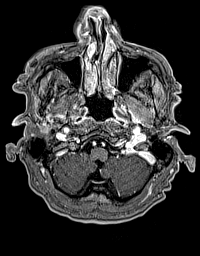
[im 40/160]
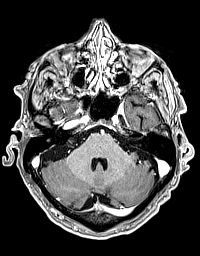
[im 60/160]
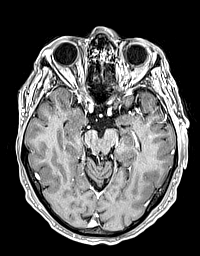
[im 80/160]
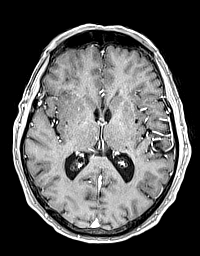
[im 100/160]
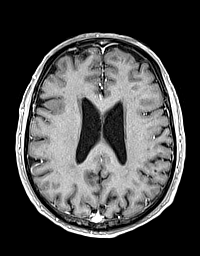
[im 120/160]
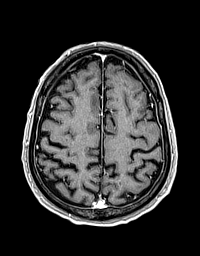
[im 140/160]
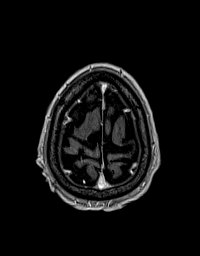
[im 160/160]
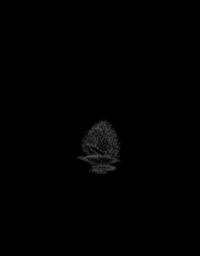

[Series 17: T1 post-contrast · coronal · 5.0mm · 0.43mm/px · 2 of 30 slices shown (2 of 3)]
[im 1/30]
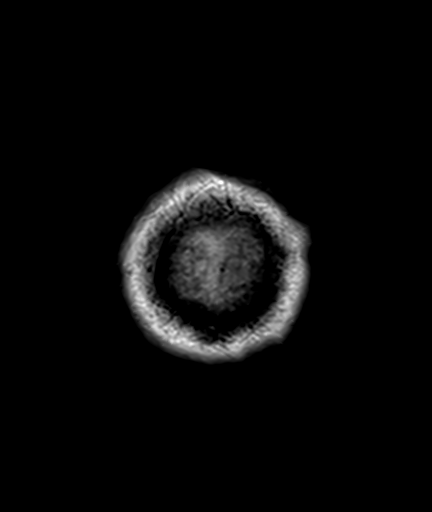
[im 30/30]
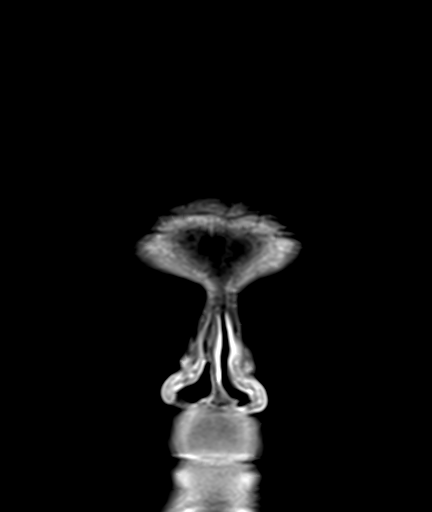

[Series 18: T1 post-contrast · sagittal · 5.0mm · 0.75mm/px · 1 of 24 slices shown (3 of 3)]
[im 1/24]
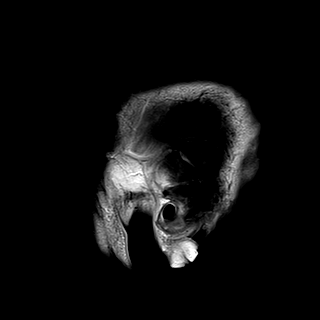

[48 of 48 positions shown; findings below may reference images not displayed]

FINDINGS: Brain:

Cerebral volume is normal for age.

There is a 2 mm focus of restricted diffusion within the left
hippocampus (series 5, image 78) (series 13, image 52). There is no
definite corresponding enhancement at this site.

Elsewhere, no abnormal enhancement is demonstrated to suggest
intracranial metastatic disease.

Mild multifocal T2/FLAIR hyperintensity within the cerebral white
matter is nonspecific, but consistent with chronic small vessel
ischemic disease.

No chronic intracranial blood products.

No extra-axial fluid collection.

No midline shift.

Vascular: Expected proximal arterial flow voids. Incidentally noted
left temporal lobe developmental venous anomaly.

Skull and upper cervical spine: No focal marrow lesion. Incompletely
assessed upper cervical spondylosis.

Sinuses/Orbits: Visualized orbits show no acute finding. Mild
ethmoid and maxillary sinus mucosal thickening. No significant
mastoid effusion.

These results will be called to the ordering clinician or
representative by the Radiologist Assistant, and communication
documented in the PACS or [REDACTED].
IMPRESSION: 2 mm focus of restricted diffusion within the left hippocampus
without definite corresponding enhancement. This may reflect an
acute/early subacute infarct. However, consider short-interval
contrast-enhanced MRI follow-up to ensure resolution of this signal
abnormality and exclude an intracranial metastatic lesion.

No evidence of intracranial metastatic disease elsewhere.

Mild generalized cerebral atrophy and chronic small vessel ischemic
disease.

Mild ethmoid and maxillary sinus mucosal thickening.

## 2020-04-13 MED ORDER — GADOBUTROL 1 MMOL/ML IV SOLN
6.0000 mL | Freq: Once | INTRAVENOUS | Status: AC | PRN
  Administered 2020-04-13: 6 mL via INTRAVENOUS

## 2020-04-13 NOTE — Telephone Encounter (Signed)
I received a call from radiology, a code critical for MRI brain that showed a restricted area of diffusion that was about 2 mm in the left hippocampus. The patient had been having severe and debilitating headaches when he described them two weeks ago, but he states that in the last week he has been asymptomatic. I let him know that I would discuss with Dr. Lisbeth Renshaw and we'd present in brain oncology conference on Monday. He may need to see Dr. Mickeal Skinner in neuro oncology as well. In the meantime if he becomes symptomatic he was told to go to the ED. He is in agreement. We are still trying to coordinate his new baseline CT chest and neck to evaluate and make sure there's not a vascular issue while we also get baseline CT chest given his recent lung cancer treatment, as well as GI evaluation due to symptoms.

## 2020-04-13 NOTE — Telephone Encounter (Signed)
Duplicate encounter opened in error.

## 2020-04-14 ENCOUNTER — Telehealth: Payer: Self-pay | Admitting: *Deleted

## 2020-04-14 NOTE — Telephone Encounter (Signed)
CALLED PATIENT TO INFORM OF STAT LABS FOR 04-17-20 - ARRIVAL TIME- 12:15 PM @ [place], CT TO FOLLOW AFTER STAT LABS, NO RESTRICTIONS TO TES, LVM FOR A RETURN CALL

## 2020-04-17 ENCOUNTER — Inpatient Hospital Stay: Payer: Medicare Other

## 2020-04-17 ENCOUNTER — Telehealth: Payer: Self-pay | Admitting: Radiation Oncology

## 2020-04-17 ENCOUNTER — Ambulatory Visit: Payer: Self-pay | Admitting: Radiation Oncology

## 2020-04-17 ENCOUNTER — Other Ambulatory Visit: Payer: Self-pay | Admitting: Radiation Therapy

## 2020-04-17 DIAGNOSIS — C3490 Malignant neoplasm of unspecified part of unspecified bronchus or lung: Secondary | ICD-10-CM

## 2020-04-17 NOTE — Telephone Encounter (Signed)
I called and left a message for Brian Yates to let him know we discussed his case at brain oncology conference and that Dr. Mickeal Skinner recommended repeat MRI in 2 months to follow up on the subacute stroke like changes seen on his recent MRI. I also don't see that he's gotten in to GI. He is also needing to get set up for his CT scans and I'll follow up again with scheduling to see why the delay.

## 2020-04-27 ENCOUNTER — Telehealth: Payer: Self-pay | Admitting: *Deleted

## 2020-04-27 NOTE — Telephone Encounter (Signed)
CALLED PATIENT TO INFORM OF CT FOR 05-02-20 - ARRIVAL TIME- 2 PM, PATIENT TO NOT HAVE SOLID FOODS - 2 HRS. PRIOR TO TEST, TEST TO BE @ [place], LVM FOR A RETURN CALL

## 2020-05-01 ENCOUNTER — Other Ambulatory Visit: Payer: Self-pay | Admitting: *Deleted

## 2020-05-01 DIAGNOSIS — C3412 Malignant neoplasm of upper lobe, left bronchus or lung: Secondary | ICD-10-CM

## 2020-05-01 NOTE — Progress Notes (Signed)
Lab order entered for upcoming scan.  Gloriajean Dell. Leonie Green, BSN

## 2020-05-03 ENCOUNTER — Telehealth: Payer: Self-pay | Admitting: Radiation Oncology

## 2020-05-03 DIAGNOSIS — C3412 Malignant neoplasm of upper lobe, left bronchus or lung: Secondary | ICD-10-CM

## 2020-05-08 NOTE — Telephone Encounter (Signed)
  Radiation Oncology         [phone]) [phone] ________________________________  Name: Brian Yates MRN: [id number]  Date of Service: 05/03/2020  DOB: 22-Feb-1941  Post Treatment Note  Diagnosis:  Stage IA2, cT1bN0M0, NSCLC, adenocarcinoma of the LUL.  Interval Since Last Radiation: 2 months  03/21/20-03/28/20 SBRT Treatment: The LUL target was treated to 54 Gy in 3 fractions.   Narrative:  The patient was contacted today for routine follow-up. During treatment he did very well with radiotherapy. He is under the care of GI again in [place] as well as general surgery. He is getting worked up by neuro oncology and we've kept vascular surgery involved with his complaints and plans for workup. An outside MRI a few weeks after his first did show resolution of his hippocampal lesion, but he will still follow up in December for repeat MRI and evaluation in neuro oncology clinic. So far his CT neck did show a benign appearing nodule in his left thyroid, significant vascular disease in the neck involving the coronaries, and recent CT chest showed stability of his treated lesion from his stage I lung cancer. He also had complaints when I last saw him about his abdomen but this appears to have been sorted out by Dr. 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   Impression/Plan: 1. Stage IA2, cT1bN0M0, NSCLC, adenocarcinoma of the LUL. The patient has been doing well since our last visit. He needs to follow up with [place] in 6 months time and I've order another scan at [facility] at that time.  2. GI complaints/ Inguinal Hernia. The patient had a colonoscopy apparently in 2020 and he has been followed by Dr. Nehemiah Settle in [place]. We will follow any further GI complaints per Dr. Melina Copa. If he needed aggressive intervention, we would refer him to [facility]. He also continues to follow up with general surgery in [place] for a recurrent inguinal  hernia. 3. Headaches known atherosclerotic disease, and microvascular ischemic changes and possible focus of restricted diffusion in the left hippocampus. The patient will have repeat MRI and see Dr. Mickeal Skinner in neuro oncology in a few weeks. Apparently though he did have another MRI in the brain and this was on 04/27/20 in [facility] at [facility] and there was no acute change, and the left hippocampal changes had resolved. I still think he needs to proceed with being evaluated by Dr. Mickeal Skinner and Dr. Trula Slade.  4. Thyroid Nodule. This appeared to be benign but I'll keep Dr. Carolanne Grumbling involved as well.     Carola Rhine, PAC

## 2020-05-09 ENCOUNTER — Telehealth: Payer: Self-pay

## 2020-05-09 NOTE — Telephone Encounter (Signed)
Patient called to ask about a referral. Left VM that we don't currently have a referral but when we get one, we will start work on it.

## 2020-05-15 ENCOUNTER — Ambulatory Visit: Payer: Self-pay | Admitting: Radiation Oncology

## 2020-06-11 NOTE — Progress Notes (Signed)
  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  The patient received 54 Gray in 3 fractions using a IMRT technique, with 3 fields.  Narrative: The patient tolerated radiation treatment relatively well.   No unexpected difficulties.  The patient's breathing did not significantly change during the course of the treatment.  Plan: The patient has completed radiation treatment. The patient will return to radiation oncology clinic for routine followup in one month. I advised the patient to call or return sooner if they have any questions or concerns related to their recovery or treatment. ________________________________  Jodelle Gross, M.D., Ph.D.

## 2020-06-12 ENCOUNTER — Other Ambulatory Visit: Payer: Self-pay | Admitting: Radiation Oncology

## 2020-06-12 MED ORDER — LORAZEPAM 0.5 MG PO TABS
ORAL_TABLET | ORAL | 0 refills | Status: DC
Start: 2020-06-12 — End: 2020-11-07

## 2020-06-15 ENCOUNTER — Ambulatory Visit (HOSPITAL_COMMUNITY)
Admission: RE | Admit: 2020-06-15 | Discharge: 2020-06-15 | Disposition: A | Discharge status: Home / Self Care | Payer: Medicare Other | Source: Ambulatory Visit | Attending: Radiation Oncology | Admitting: Radiation Oncology

## 2020-06-15 ENCOUNTER — Other Ambulatory Visit: Payer: Self-pay

## 2020-06-15 DIAGNOSIS — C3490 Malignant neoplasm of unspecified part of unspecified bronchus or lung: Secondary | ICD-10-CM | POA: Diagnosis present

## 2020-06-15 IMAGING — MR MR HEAD WO/W CM
14 series · 48 of 48 positions shown · IV contrast (gadavist)
Comparison: [DATE]

CLINICAL DATA: Lung cancer, follow-up abnormal MRI

EXAM:
MRI HEAD WITHOUT AND WITH CONTRAST
TECHNIQUE: Multiplanar, multiecho pulse sequences of the brain and surrounding
structures were obtained without and with intravenous contrast.
CONTRAST:  6mL GADAVIST GADOBUTROL 1 MMOL/ML IV SOLN

[Series 5: DWI · axial · 3.0mm · 1.36mm/px · z∈[-38,+120]mm · 5 of 108 slices shown (1 of 4)]
[im 1/108]
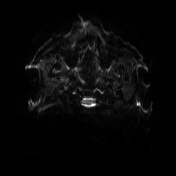
[im 27/108]
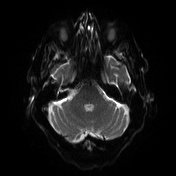
[im 54/108]
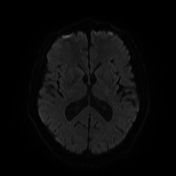
[im 81/108]
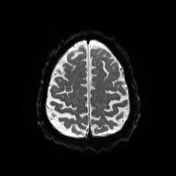
[im 108/108]
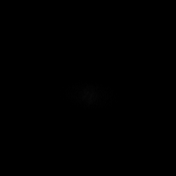

[Series 6: DWI · axial · 3.0mm · 1.36mm/px · z∈[-38,+120]mm · 3 of 53 slices shown (2 of 4)]
[im 1/53]
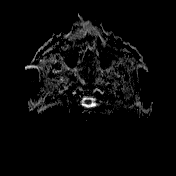
[im 27/53]
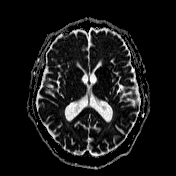
[im 53/53]
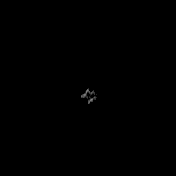

[Series 7: T1 · sagittal · 5.0mm · 0.75mm/px · 1 of 24 slices shown (1 of 2)]
[im 1/24]
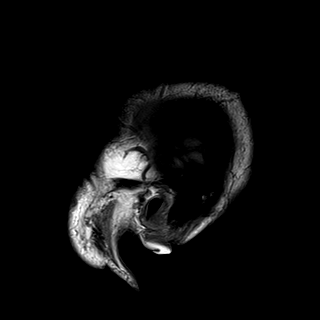

[Series 8: T2 · axial · 5.0mm · 0.62mm/px · 1 of 25 slices shown]
[im 1/25]
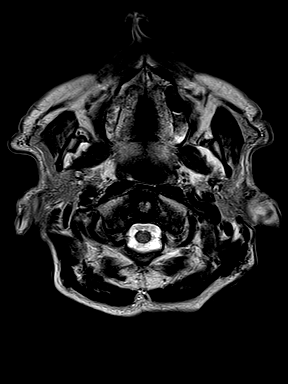

[Series 9: mip_images(sw) · axial · 24.0mm · 0.75mm/px · z∈[-32,+112]mm · 3 of 49 slices shown]
[im 1/49]
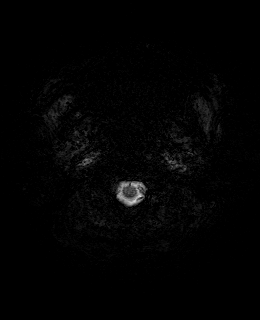
[im 25/49]
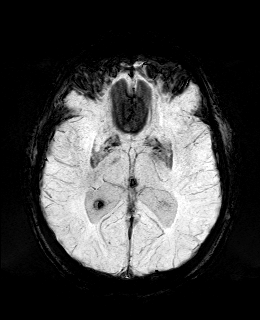
[im 49/49]
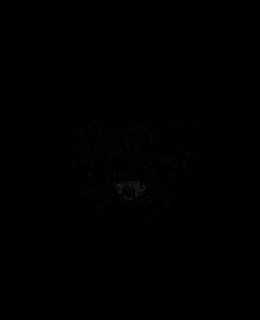

[Series 10: swi_images · axial · 3.0mm · 0.75mm/px · z∈[-42,+122]mm · 3 of 56 slices shown]
[im 1/56]
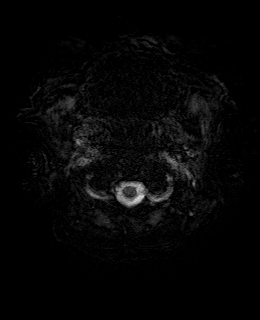
[im 28/56]
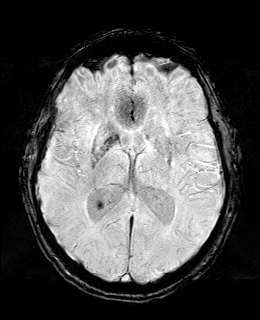
[im 56/56]
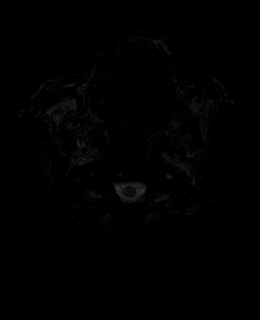

[Series 11: FLAIR · axial · 3.0mm · 0.75mm/px · z∈[-29,+122]mm · 3 of 52 slices shown]
[im 1/52]
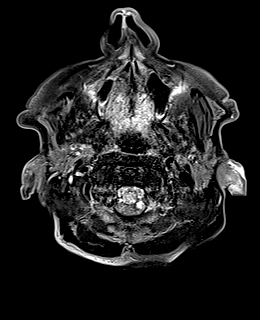
[im 26/52]
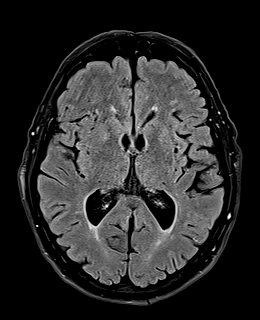
[im 52/52]
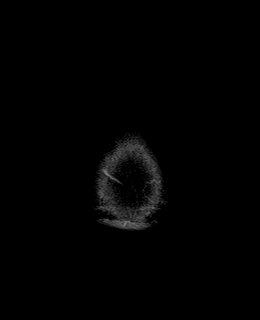

[Series 12: T1 · axial · 1.0mm · 0.94mm/px · z∈[-42,+116]mm · 9 of 160 slices shown (2 of 2)]
[im 1/160]
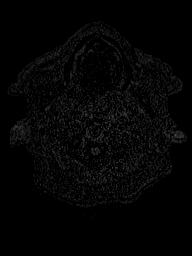
[im 20/160]
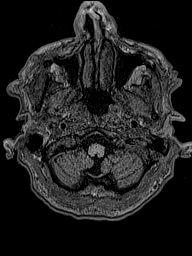
[im 40/160]
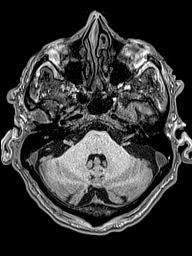
[im 60/160]
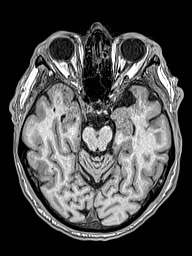
[im 80/160]
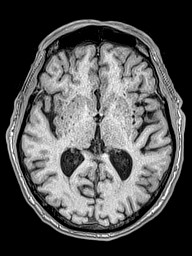
[im 100/160]
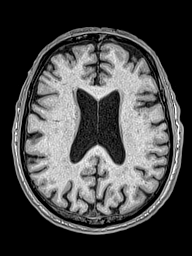
[im 120/160]
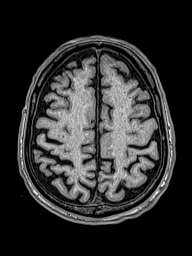
[im 140/160]
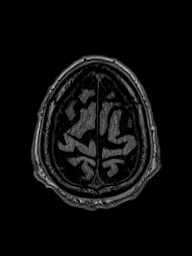
[im 160/160]
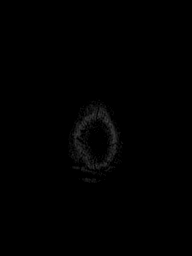

[Series 13: DWI · coronal · 5.0mm · 1.31mm/px · 4 of 72 slices shown (3 of 4)]
[im 1/72]
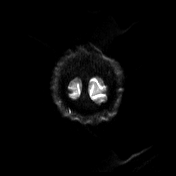
[im 24/72]
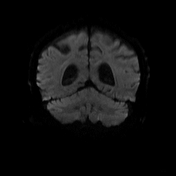
[im 48/72]
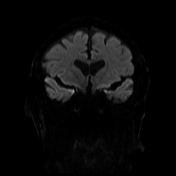
[im 72/72]
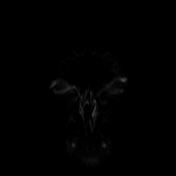

[Series 14: DWI · coronal · 5.0mm · 1.31mm/px · 2 of 36 slices shown (4 of 4)]
[im 1/36]
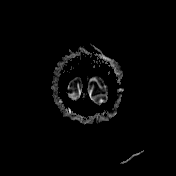
[im 36/36]
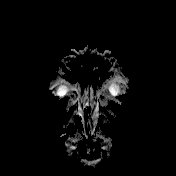

[Series 15: T2 post-contrast · coronal · 5.0mm · 0.57mm/px · 2 of 30 slices shown]
[im 1/30]
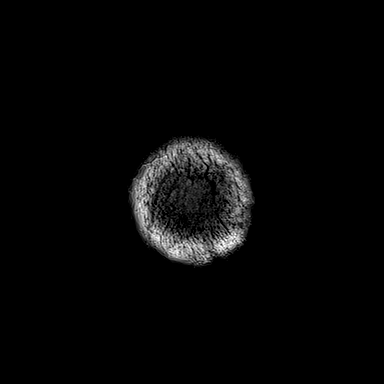
[im 30/30]
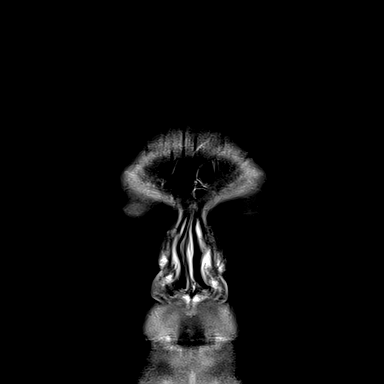

[Series 16: T1 post-contrast · axial · 1.0mm · 0.94mm/px · z∈[-42,+116]mm · 9 of 160 slices shown (1 of 3)]
[im 1/160]
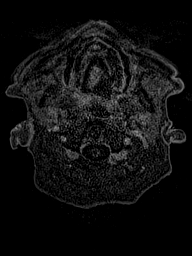
[im 20/160]
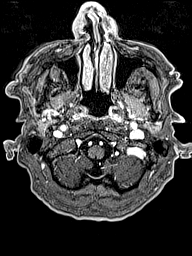
[im 40/160]
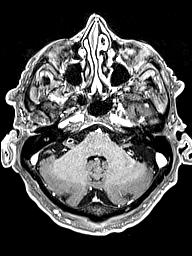
[im 60/160]
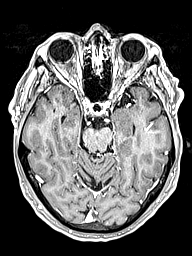
[im 80/160]
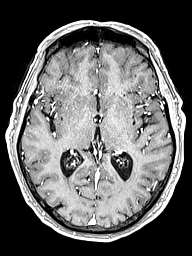
[im 100/160]
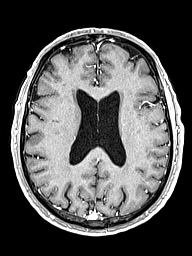
[im 120/160]
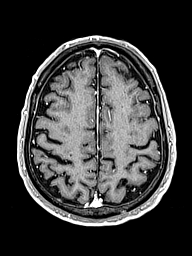
[im 140/160]
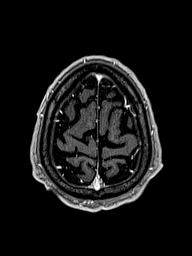
[im 160/160]
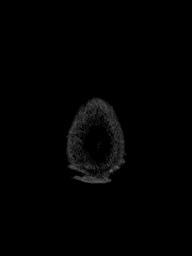

[Series 17: T1 post-contrast · coronal · 5.0mm · 0.43mm/px · 2 of 30 slices shown (2 of 3)]
[im 1/30]
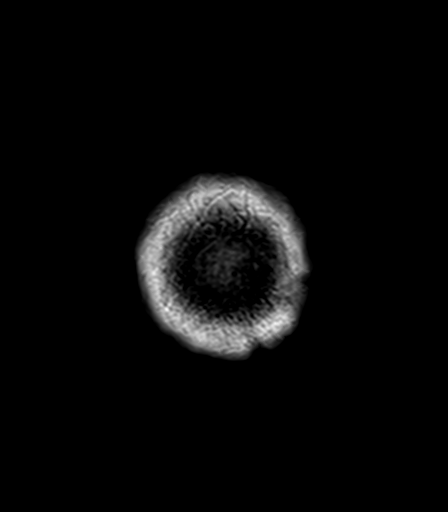
[im 30/30]
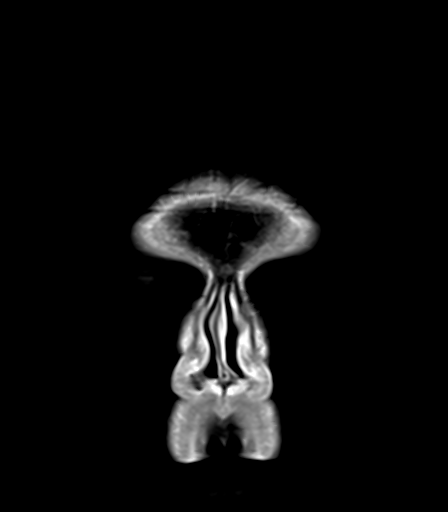

[Series 18: T1 post-contrast · sagittal · 5.0mm · 0.75mm/px · 1 of 24 slices shown (3 of 3)]
[im 1/24]
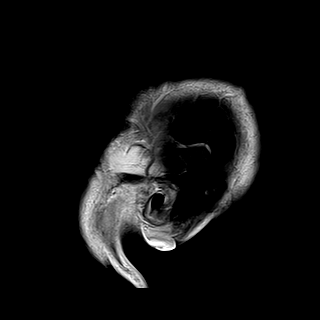

[48 of 48 positions shown; findings below may reference images not displayed]

FINDINGS: Brain: There is no acute infarction or intracranial hemorrhage.
There is no intracranial mass, mass effect, or edema. There is no
hydrocephalus or extra-axial fluid collection. Ventricles and sulci
are stable in size and configuration. Patchy foci of T2
hyperintensity in the supratentorial white matter are nonspecific
but probably reflects stable chronic microvascular ischemic changes.
No abnormal enhancement.

Vascular: Major vessel flow voids at the skull base are preserved.

Skull and upper cervical spine: Normal marrow signal is preserved.

Sinuses/Orbits: Mild mucosal thickening. Bilateral lens
replacements.

Other: Sella is unremarkable.  Mastoid air cells are clear.
IMPRESSION: No evidence of intracranial metastatic disease or significant change
since the prior study.

## 2020-06-15 MED ORDER — GADOBUTROL 1 MMOL/ML IV SOLN
6.0000 mL | Freq: Once | INTRAVENOUS | Status: AC | PRN
  Administered 2020-06-15: 6 mL via INTRAVENOUS

## 2020-06-19 ENCOUNTER — Other Ambulatory Visit: Payer: Self-pay

## 2020-06-19 ENCOUNTER — Inpatient Hospital Stay: Payer: Medicare Other | Attending: Internal Medicine | Admitting: Internal Medicine

## 2020-06-19 ENCOUNTER — Inpatient Hospital Stay: Payer: Medicare Other

## 2020-06-19 ENCOUNTER — Encounter: Payer: Self-pay | Admitting: Internal Medicine

## 2020-06-19 DIAGNOSIS — J449 Chronic obstructive pulmonary disease, unspecified: Secondary | ICD-10-CM | POA: Diagnosis not present

## 2020-06-19 DIAGNOSIS — G939 Disorder of brain, unspecified: Secondary | ICD-10-CM | POA: Diagnosis not present

## 2020-06-19 DIAGNOSIS — N529 Male erectile dysfunction, unspecified: Secondary | ICD-10-CM | POA: Diagnosis not present

## 2020-06-19 DIAGNOSIS — I10 Essential (primary) hypertension: Secondary | ICD-10-CM | POA: Diagnosis not present

## 2020-06-19 DIAGNOSIS — M109 Gout, unspecified: Secondary | ICD-10-CM | POA: Diagnosis not present

## 2020-06-19 DIAGNOSIS — E785 Hyperlipidemia, unspecified: Secondary | ICD-10-CM | POA: Diagnosis not present

## 2020-06-19 DIAGNOSIS — E538 Deficiency of other specified B group vitamins: Secondary | ICD-10-CM | POA: Insufficient documentation

## 2020-06-19 DIAGNOSIS — I252 Old myocardial infarction: Secondary | ICD-10-CM | POA: Insufficient documentation

## 2020-06-19 DIAGNOSIS — D509 Iron deficiency anemia, unspecified: Secondary | ICD-10-CM | POA: Insufficient documentation

## 2020-06-19 DIAGNOSIS — F17210 Nicotine dependence, cigarettes, uncomplicated: Secondary | ICD-10-CM | POA: Insufficient documentation

## 2020-06-19 DIAGNOSIS — Z7982 Long term (current) use of aspirin: Secondary | ICD-10-CM | POA: Insufficient documentation

## 2020-06-19 DIAGNOSIS — M5030 Other cervical disc degeneration, unspecified cervical region: Secondary | ICD-10-CM | POA: Insufficient documentation

## 2020-06-19 DIAGNOSIS — Z79899 Other long term (current) drug therapy: Secondary | ICD-10-CM | POA: Insufficient documentation

## 2020-06-19 DIAGNOSIS — Z800 Family history of malignant neoplasm of digestive organs: Secondary | ICD-10-CM | POA: Diagnosis not present

## 2020-06-19 DIAGNOSIS — Z803 Family history of malignant neoplasm of breast: Secondary | ICD-10-CM | POA: Insufficient documentation

## 2020-06-19 DIAGNOSIS — G629 Polyneuropathy, unspecified: Secondary | ICD-10-CM | POA: Diagnosis not present

## 2020-06-19 DIAGNOSIS — I2510 Atherosclerotic heart disease of native coronary artery without angina pectoris: Secondary | ICD-10-CM | POA: Diagnosis not present

## 2020-06-19 DIAGNOSIS — R9089 Other abnormal findings on diagnostic imaging of central nervous system: Secondary | ICD-10-CM | POA: Insufficient documentation

## 2020-06-19 DIAGNOSIS — F1010 Alcohol abuse, uncomplicated: Secondary | ICD-10-CM | POA: Diagnosis not present

## 2020-06-19 DIAGNOSIS — I480 Paroxysmal atrial fibrillation: Secondary | ICD-10-CM | POA: Insufficient documentation

## 2020-06-19 DIAGNOSIS — Z85118 Personal history of other malignant neoplasm of bronchus and lung: Secondary | ICD-10-CM | POA: Diagnosis not present

## 2020-06-19 NOTE — Progress Notes (Signed)
[facility] [place], [place] [address] [phone]   New Patient Evaluation  Date of Service: 06/19/20 Patient Name: Brian Yates Patient MRN: [id number] Patient DOB: February 21, 1941 Provider: Ventura Sellers, MD  Identifying Statement:  Brian Yates is a 79 y.o. male with abnormal brain MRI who presents for initial consultation and evaluation regarding cancer associated neurologic deficits.    Referring Provider: Kyung Rudd, MD [place] [address] [place],  [place] [address]  Primary Cancer: Lung adenocarcinoma  History of Present Illness: The patient's records from the referring physician were obtained and reviewed and the patient interviewed to confirm this HPI.  Brian Yates presets to clinical to review recent brain imaging and discussion per brain/spine tumor board.  He denies and focal neurologic deficits.  No seizures, headaches, weakness/numbness, no gait impairment.  He underwent screening brain MRI in October 2021 which demonstrated region of restricted diffusion within left hippocampus.  Repeat study was recommended due to concern for metastatic process, which he completed this past week.  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  Medications: Current Outpatient Medications on File Prior to Visit  Medication Sig Dispense Refill  . albuterol (VENTOLIN HFA) 108 (90 Base) MCG/ACT inhaler Inhale 2 puffs into the lungs in the morning, at noon, in the evening, and at bedtime.    Marland Kitchen aspirin EC 81 MG tablet Take 81 mg by mouth daily. Swallow whole.    Marland Kitchen atorvastatin (LIPITOR) 40 MG tablet Take 20 mg by mouth every evening.     . brimonidine (ALPHAGAN P) 0.1 % SOLN Place 1 drop into both eyes in the morning and at bedtime.     . fenofibrate micronized (LOFIBRA) 134 MG capsule Take 134 mg by mouth every evening.     . Glucosamine-Chondroitin (COSAMIN DS PO) Take 2 tablets by mouth daily. W/Vitamin d3     . hydrOXYzine (ATARAX/VISTARIL) 50 MG tablet Take 50 mg by mouth at bedtime as needed (sleep).     . isosorbide mononitrate (IMDUR) 30 MG 24 hr tablet Take 30 mg by mouth daily.    Javier Docker Oil 500 MG CAPS Take 500 mg by mouth daily.    Marland Kitchen latanoprost (XALATAN) 0.005 % ophthalmic solution Place 1 drop into both eyes at bedtime.    Marland Kitchen LORazepam (ATIVAN) 0.5 MG tablet 1 tab po 30 minutes prior to MRI, may repeat prn 2 tablet 0  . Misc Natural Products (ADV TURMERIC CURCUMIN COMPLEX PO) Take 2,000 mg by mouth daily. 1000 mg/capsule    . Moringa Oleifera (MORINGA PO) Take 1,400 mg by mouth daily.     Marland Kitchen OVER THE COUNTER MEDICATION Take 2 capsules by mouth in the morning and at bedtime. Neuropathy & Nerve Support Supplement with 600 mg Alpha Lipoic Acid    . Probiotic Product (PROBIOTIC PO) Take 1 capsule by mouth daily.    . tamsulosin (FLOMAX) 0.4 MG CAPS capsule Take 0.4 mg by mouth at bedtime.    . temazepam (RESTORIL) 30 MG capsule Take 30 mg by mouth at bedtime as needed for sleep.     . traZODone (DESYREL) 150 MG tablet Take 150 mg by mouth at bedtime as needed for sleep.    Marland Kitchen triamcinolone cream (KENALOG) 0.1 % Apply 1 application topically 3 (three) times daily as needed (itching.).    Marland Kitchen nitroGLYCERIN (NITROSTAT) 0.4 MG SL tablet Place 1 tablet (0.4 mg total) under the tongue every 5 (  five) minutes x 3 doses as needed for chest pain. (Patient not taking: Reported on 06/19/2020) 25 tablet 3   No current facility-administered medications on file prior to visit.    Allergies:  Allergies  Allergen Reactions  . Bentyl [Dicyclomine Hcl] Other (See Comments)    Hallucinations   . Codeine Sulfate [Codeine] Nausea And Vomiting  . Lisinopril Other (See Comments)    unknown  . Valsartan Other (See Comments)    unknown   Past Medical History:  Past Medical History:  Diagnosis Date  . Alcohol abuse   . Anemia due to gastrointestinal blood loss   . AR (allergic rhinitis)   . 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 Cervical disc disease   . Cervical radiculopathy   . COLD (chronic obstructive lung disease) ([place])   . Controlled gout   . Coronary atherosclerosis of native coronary artery   . Diverticulosis   . Diverticulosis of colon with hemorrhage   . Erectile dysfunction   . FH: colon cancer   . Glaucoma   . History of hepatitis B   . History of skin cancer   . Hx of colonic polyps   . Hyperlipidemia   . Hypertension   . Irritable bowel syndrome   . Mass of jaw   . Neuropathy of left foot   . Previous myocardial infarction older than 8 weeks 1998  . Tobacco abuse   . Tremor   . Vitamin B12 deficiency anemia    Past Surgical History:  Past Surgical History:  Procedure Laterality Date  . CATARACT EXTRACTION Bilateral   . HERNIA REPAIR Bilateral   . LARYNX SURGERY     Social History:  Social History   Socioeconomic History  . Marital status: Married    Spouse name: Not on file  . Number of children: Not on file  . Years of education: Not on file  . Highest education level: Not on file  Occupational History  . Occupation: Retired  Tobacco Use  . Smoking status: Current Every Day Smoker    Packs/day: 1.50  . Smokeless tobacco: Never Used  . Tobacco comment: Trying to quit  Vaping Use  . Vaping Use: Never used  Substance and Sexual Activity  . Alcohol use: No  . Drug use: No  . Sexual activity: Not on file  Other Topics Concern  . Not on file  Social History Narrative  . Not on file   Social Determinants of Health   Financial Resource Strain: Not on file  Food Insecurity: Not on file  Transportation Needs: Not on file  Physical Activity: Not on file  Stress: Not on file  Social Connections: Not on file  Intimate Partner Violence: Not on file   Family History:  Family History  Problem Relation Age of Onset  . Hypertension Mother   . Hyperlipidemia Mother   . Colon cancer Mother   . Cancer - Other Mother   . Hypertension  Father   . Hyperlipidemia Father   . Heart disease Father   . CAD Father   . Heart attack Father   . Alcohol abuse Father   . Breast cancer Sister   . Stroke Maternal Aunt   . Heart attack Paternal Grandfather   . Heart attack Paternal Uncle     Review of Systems: Constitutional: Doesn't report fevers, chills or abnormal weight loss Eyes: Doesn't report blurriness of vision Ears, nose, mouth, throat, and face: Doesn't report sore throat Respiratory:  Doesn't report cough, dyspnea or wheezes Cardiovascular: Doesn't report palpitation, chest discomfort  Gastrointestinal:  Doesn't report nausea, constipation, diarrhea GU: Doesn't report incontinence Skin: Doesn't report skin rashes Neurological: Per HPI Musculoskeletal: Doesn't report joint pain Behavioral/Psych: Doesn't report anxiety  Physical Exam: Vitals:   06/19/20 1006  BP: (!) 127/52  Pulse: 78  Resp: 20  Temp: 98.1 F (36.7 C)  SpO2: 100%   KPS: 90. General: Alert, cooperative, pleasant, in no acute distress Head: Normal EENT: No conjunctival injection or scleral icterus.  Lungs: Resp effort normal Cardiac: Regular rate Abdomen: Non-distended abdomen Skin: No rashes cyanosis or petechiae. Extremities: No clubbing or edema  Neurologic Exam: Mental Status: Awake, alert, attentive to examiner. Oriented to self and environment. Language is fluent with intact comprehension.  Cranial Nerves: Visual acuity is grossly normal. Visual fields are full. Extra-ocular movements intact. No ptosis. Face is symmetric Motor: Tone and bulk are normal. Power is full in both arms and legs. Reflexes are symmetric, no pathologic reflexes present.  Sensory: Intact to light touch Gait: Normal.   Labs: I have reviewed the data as listed No results found for: NA, K, CL, CO2, GLUCOSE, BUN, CREATININE, CALCIUM, PROT, ALBUMIN, AST, ALT, ALKPHOS, BILITOT, GFRNONAA, GFRAA Lab Results  Component Value Date   WBC 11.1 (H) 02/23/2020    HGB 12.7 (L) 02/23/2020   HCT 38.5 (L) 02/23/2020   MCV 88.9 02/23/2020   PLT 193 02/23/2020    Imaging:  MR Brain W Wo Contrast  Result Date: 06/15/2020 CLINICAL DATA:  Lung cancer, follow-up abnormal MRI EXAM: MRI HEAD WITHOUT AND WITH CONTRAST TECHNIQUE: Multiplanar, multiecho pulse sequences of the brain and surrounding structures were obtained without and with intravenous contrast. CONTRAST:  86mL GADAVIST GADOBUTROL 1 MMOL/ML IV SOLN COMPARISON:  04/27/2020 FINDINGS: Brain: There is no acute infarction or intracranial hemorrhage. There is no intracranial mass, mass effect, or edema. There is no hydrocephalus or extra-axial fluid collection. Ventricles and sulci are stable in size and configuration. Patchy foci of T2 hyperintensity in the supratentorial white matter are nonspecific but probably reflects stable chronic microvascular ischemic changes. No abnormal enhancement. Vascular: Major vessel flow voids at the skull base are preserved. Skull and upper cervical spine: Normal marrow signal is preserved. Sinuses/Orbits: Mild mucosal thickening. Bilateral lens replacements. Other: Sella is unremarkable.  Mastoid air cells are clear. IMPRESSION: No evidence of intracranial metastatic disease or significant change since the prior study. Electronically Signed   By: Macy Mis M.D.   On: 06/15/2020 11:09     Assessment/Plan Abnormal Brain MRI  Brian Yates is clinically stable and asymptomatic from neurologic standpoint.  Brain MRI demonstrated resolution of focus of restricted diffusion, without parenchymal enhancement.  Etiology is unclear, but unlikely neoplasm given lack of therapy.  Small vessel infarct is possible based on risk factor profile, burden of FLAIR based vascular lesions in both hemispheres.   He does carry a diagnosis of paroxysmal atrial fibrillation, but also has history of GI bleeding.  He has had recent echocardiogram.  Without definitive evidence of cardio-embolism,  or even stroke at all, we recommended initiation of daily ASA 81mg  as a secondary prevention measure rather than anticoagulation.  We reviewed stroke risk factor modification and home triage instructions for acute stroke.    We spent twenty additional minutes teaching regarding the natural history, biology, and historical experience in the treatment of neurologic complications of cancer.   We appreciate the opportunity to participate in the care of [employer].  He may return to clinic as needed.  All questions were answered. The patient knows to call the clinic with any problems, questions or concerns. No barriers to learning were detected.  The total time spent in the encounter was 40 minutes and more than 50% was on counseling and review of test results   Ventura Sellers, MD Medical Director of Neuro-Oncology [facility] [place] 06/19/20 4:41 PM

## 2020-06-27 ENCOUNTER — Encounter: Payer: Medicare Other | Admitting: Gastroenterology

## 2020-07-26 ENCOUNTER — Telehealth: Payer: Self-pay

## 2020-07-26 NOTE — Telephone Encounter (Signed)
Patient called office stating his peripheral neuropathy has worsened and wants to know if he can be seen by our office. Stated he has had neuropathy "for a while", but lately it has become worse. Patient states neuropathy is in bilateral feet, with it radiating up R leg.  Patient states he is losing sleep due to neuropathy.  Patient states he called his PCP's office but was told to follow-up with his neurologist. Please advise.

## 2020-07-27 NOTE — Telephone Encounter (Signed)
Message sent to scheduling department for MD follow-up. Called and informed patient to expect a call from scheduling. Patient verbalized understanding.

## 2020-07-27 NOTE — Telephone Encounter (Signed)
Yes, we can see him for follow up  Brian Sellers, MD

## 2020-07-28 ENCOUNTER — Telehealth: Payer: Self-pay | Admitting: Internal Medicine

## 2020-07-28 NOTE — Telephone Encounter (Signed)
Scheduled follow-up appointment per 1/27 schedule message. Patient is aware.

## 2020-08-08 ENCOUNTER — Inpatient Hospital Stay: Payer: Medicare Other | Attending: Internal Medicine | Admitting: Internal Medicine

## 2020-08-08 ENCOUNTER — Other Ambulatory Visit: Payer: Self-pay

## 2020-08-08 VITALS — BP 153/56 | HR 71 | Temp 97.6°F | Resp 20 | Ht 67.0 in | Wt 147.4 lb

## 2020-08-08 DIAGNOSIS — Z803 Family history of malignant neoplasm of breast: Secondary | ICD-10-CM | POA: Diagnosis not present

## 2020-08-08 DIAGNOSIS — Z7982 Long term (current) use of aspirin: Secondary | ICD-10-CM | POA: Diagnosis not present

## 2020-08-08 DIAGNOSIS — R9089 Other abnormal findings on diagnostic imaging of central nervous system: Secondary | ICD-10-CM | POA: Diagnosis not present

## 2020-08-08 DIAGNOSIS — I2510 Atherosclerotic heart disease of native coronary artery without angina pectoris: Secondary | ICD-10-CM | POA: Insufficient documentation

## 2020-08-08 DIAGNOSIS — Z79899 Other long term (current) drug therapy: Secondary | ICD-10-CM | POA: Insufficient documentation

## 2020-08-08 DIAGNOSIS — F17210 Nicotine dependence, cigarettes, uncomplicated: Secondary | ICD-10-CM | POA: Insufficient documentation

## 2020-08-08 DIAGNOSIS — I252 Old myocardial infarction: Secondary | ICD-10-CM | POA: Insufficient documentation

## 2020-08-08 DIAGNOSIS — J449 Chronic obstructive pulmonary disease, unspecified: Secondary | ICD-10-CM | POA: Diagnosis not present

## 2020-08-08 DIAGNOSIS — G939 Disorder of brain, unspecified: Secondary | ICD-10-CM

## 2020-08-08 DIAGNOSIS — E785 Hyperlipidemia, unspecified: Secondary | ICD-10-CM | POA: Insufficient documentation

## 2020-08-08 DIAGNOSIS — I739 Peripheral vascular disease, unspecified: Secondary | ICD-10-CM

## 2020-08-08 DIAGNOSIS — Z86010 Personal history of colonic polyps: Secondary | ICD-10-CM | POA: Diagnosis not present

## 2020-08-08 DIAGNOSIS — D500 Iron deficiency anemia secondary to blood loss (chronic): Secondary | ICD-10-CM | POA: Insufficient documentation

## 2020-08-08 DIAGNOSIS — F1010 Alcohol abuse, uncomplicated: Secondary | ICD-10-CM | POA: Diagnosis not present

## 2020-08-08 DIAGNOSIS — I10 Essential (primary) hypertension: Secondary | ICD-10-CM | POA: Diagnosis not present

## 2020-08-08 DIAGNOSIS — Z85828 Personal history of other malignant neoplasm of skin: Secondary | ICD-10-CM | POA: Insufficient documentation

## 2020-08-08 DIAGNOSIS — G629 Polyneuropathy, unspecified: Secondary | ICD-10-CM | POA: Insufficient documentation

## 2020-08-08 DIAGNOSIS — E538 Deficiency of other specified B group vitamins: Secondary | ICD-10-CM | POA: Diagnosis not present

## 2020-08-08 DIAGNOSIS — R200 Anesthesia of skin: Secondary | ICD-10-CM | POA: Diagnosis not present

## 2020-08-08 DIAGNOSIS — K589 Irritable bowel syndrome without diarrhea: Secondary | ICD-10-CM | POA: Diagnosis not present

## 2020-08-08 MED ORDER — GABAPENTIN 300 MG PO CAPS: 300.0000 mg | ORAL_CAPSULE | Freq: Every day | ORAL | 2 refills | Status: DC

## 2020-08-08 NOTE — Progress Notes (Signed)
[facility] [place], [place] [address] [phone]   Interval Evaluation  Date of Service: 08/08/20 Patient Name: Brian Yates Patient MRN: [id number] Patient DOB: March 13, 1941 Provider: Ventura Sellers, MD  Identifying Statement:  Brian Yates is a 80 y.o. male with abnormal brain MRI, pain syndrome  Referring Provider: Nicoletta Dress, MD [place],  [place] [address]  Primary Cancer: Lung adenocarcinoma  Interval History:  [name] presents today to review symptoms concerning for neuropathy.  He describes several issues; first is aching pain in his lower legs (below the knee) which is brought about by activity or exercise and resolved with rest.  The other complaint is numbness on the top aspect of his left foot, with some weakness in the ankle.  Denies headaches, seizures.   H+P (06/19/20) Patient presets to clinical to review recent brain imaging and discussion per brain/spine tumor board.  He denies and focal neurologic deficits.  No seizures, headaches, weakness/numbness, no gait impairment.  He underwent screening brain MRI in October 2021 which demonstrated region of restricted diffusion within left hippocampus.  Repeat study was recommended due to concern for metastatic process, which he completed this past week.  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  Medications: Current Outpatient Medications on File Prior to Visit  Medication Sig Dispense Refill  . albuterol (VENTOLIN HFA) 108 (90 Base) MCG/ACT inhaler Inhale 2 puffs into the lungs in the morning, at noon, in the evening, and at bedtime.    Marland Kitchen aspirin EC 81 MG tablet Take 81 mg by mouth daily. Swallow whole.    Marland Kitchen atorvastatin (LIPITOR) 40 MG tablet Take 20 mg by mouth every evening.     . brimonidine (ALPHAGAN P) 0.1 % SOLN Place 1 drop into both eyes in the morning and at bedtime.     .  fenofibrate micronized (LOFIBRA) 134 MG capsule Take 134 mg by mouth every evening.     . Glucosamine-Chondroitin (COSAMIN DS PO) Take 2 tablets by mouth daily. W/Vitamin d3    . hydrOXYzine (ATARAX/VISTARIL) 50 MG tablet Take 50 mg by mouth at bedtime as needed (sleep).     . isosorbide mononitrate (IMDUR) 30 MG 24 hr tablet Take 30 mg by mouth daily.    Javier Docker Oil 500 MG CAPS Take 500 mg by mouth daily.    Marland Kitchen latanoprost (XALATAN) 0.005 % ophthalmic solution Place 1 drop into both eyes at bedtime.    Marland Kitchen LORazepam (ATIVAN) 0.5 MG tablet 1 tab po 30 minutes prior to MRI, may repeat prn 2 tablet 0  . Misc Natural Products (ADV TURMERIC CURCUMIN COMPLEX PO) Take 2,000 mg by mouth daily. 1000 mg/capsule    . Moringa Oleifera (MORINGA PO) Take 1,400 mg by mouth daily.     . nitroGLYCERIN (NITROSTAT) 0.4 MG SL tablet Place 1 tablet (0.4 mg total) under the tongue every 5 (five) minutes x 3 doses as needed for chest pain. (Patient not taking: Reported on 06/19/2020) 25 tablet 3  . OVER THE COUNTER MEDICATION Take 2 capsules by mouth in the morning and at bedtime. Neuropathy & Nerve Support Supplement with 600 mg Alpha Lipoic Acid    . Probiotic Product (PROBIOTIC PO) Take 1 capsule by mouth daily.    . tamsulosin (FLOMAX) 0.4 MG CAPS capsule Take 0.4 mg by mouth at bedtime.    . temazepam (RESTORIL) 30 MG capsule Take  30 mg by mouth at bedtime as needed for sleep.     . traZODone (DESYREL) 150 MG tablet Take 150 mg by mouth at bedtime as needed for sleep.    Marland Kitchen triamcinolone cream (KENALOG) 0.1 % Apply 1 application topically 3 (three) times daily as needed (itching.).     No current facility-administered medications on file prior to visit.    Allergies:  Allergies  Allergen Reactions  . Bentyl [Dicyclomine Hcl] Other (See Comments)    Hallucinations   . Codeine Sulfate [Codeine] Nausea And Vomiting  . Lisinopril Other (See Comments)    unknown  . Valsartan Other (See Comments)    unknown    Past Medical History:  Past Medical History:  Diagnosis Date  . Alcohol abuse   . Anemia due to gastrointestinal blood loss   . AR (allergic rhinitis)   . 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 Cervical disc disease   . Cervical radiculopathy   . COLD (chronic obstructive lung disease) ([place])   . Controlled gout   . Coronary atherosclerosis of native coronary artery   . Diverticulosis   . Diverticulosis of colon with hemorrhage   . Erectile dysfunction   . FH: colon cancer   . Glaucoma   . History of hepatitis B   . History of skin cancer   . Hx of colonic polyps   . Hyperlipidemia   . Hypertension   . Irritable bowel syndrome   . Mass of jaw   . Neuropathy of left foot   . Previous myocardial infarction older than 8 weeks 1998  . Tobacco abuse   . Tremor   . Vitamin B12 deficiency anemia    Past Surgical History:  Past Surgical History:  Procedure Laterality Date  . CATARACT EXTRACTION Bilateral   . HERNIA REPAIR Bilateral   . LARYNX SURGERY     Social History:  Social History   Socioeconomic History  . Marital status: Married    Spouse name: Not on file  . Number of children: Not on file  . Years of education: Not on file  . Highest education level: Not on file  Occupational History  . Occupation: Retired  Tobacco Use  . Smoking status: Current Every Day Smoker    Packs/day: 1.50  . Smokeless tobacco: Never Used  . Tobacco comment: Trying to quit  Vaping Use  . Vaping Use: Never used  Substance and Sexual Activity  . Alcohol use: No  . Drug use: No  . Sexual activity: Not on file  Other Topics Concern  . Not on file  Social History Narrative  . Not on file   Social Determinants of Health   Financial Resource Strain: Not on file  Food Insecurity: Not on file  Transportation Needs: Not on file  Physical Activity: Not on file  Stress: Not on file  Social Connections: Not on file  Intimate Partner Violence: Not on file   Family  History:  Family History  Problem Relation Age of Onset  . Hypertension Mother   . Hyperlipidemia Mother   . Colon cancer Mother   . Cancer - Other Mother   . Hypertension Father   . Hyperlipidemia Father   . Heart disease Father   . CAD Father   . Heart attack Father   . Alcohol abuse Father   . Breast cancer Sister   . Stroke Maternal Aunt   . Heart attack Paternal Grandfather   . Heart attack Paternal  Uncle     Review of Systems: Constitutional: Doesn't report fevers, chills or abnormal weight loss Eyes: Doesn't report blurriness of vision Ears, nose, mouth, throat, and face: Doesn't report sore throat Respiratory: Doesn't report cough, dyspnea or wheezes Cardiovascular: Doesn't report palpitation, chest discomfort  Gastrointestinal:  Doesn't report nausea, constipation, diarrhea GU: Doesn't report incontinence Skin: Doesn't report skin rashes Neurological: Per HPI Musculoskeletal: Doesn't report joint pain Behavioral/Psych: Doesn't report anxiety  Physical Exam: Vitals:   08/08/20 1003  BP: (!) 153/56  Pulse: 71  Resp: 20  Temp: 97.6 F (36.4 C)  SpO2: 99%   KPS: 80. General: Alert, cooperative, pleasant, in no acute distress Head: Normal EENT: No conjunctival injection or scleral icterus.  Lungs: Resp effort normal Cardiac: Regular rate Abdomen: Non-distended abdomen Skin: No rashes cyanosis or petechiae. Extremities: No clubbing or edema  Neurologic Exam: Mental Status: Awake, alert, attentive to examiner. Oriented to self and environment. Language is fluent with intact comprehension.  Cranial Nerves: Visual acuity is grossly normal. Visual fields are full. Extra-ocular movements intact. No ptosis. Face is symmetric Motor: Tone and bulk are normal. Slight weakness of dorsiflexion, left ankle. Loss of ankle jerk on left, no pathologic reflexes present.  Sensory: Impaired in superior aspect left foot  Gait: Deferred   Labs: I have reviewed the data as  listed No results found for: NA, K, CL, CO2, GLUCOSE, BUN, CREATININE, CALCIUM, PROT, ALBUMIN, AST, ALT, ALKPHOS, BILITOT, GFRNONAA, GFRAA Lab Results  Component Value Date   WBC 11.1 (H) 02/23/2020   HGB 12.7 (L) 02/23/2020   HCT 38.5 (L) 02/23/2020   MCV 88.9 02/23/2020   PLT 193 02/23/2020     Assessment/Plan Abnormal Brain MRI  Marcello Moores A Schoneman presents today with clinical syndrome consistent with mildly symptomatic complete peroneal neuropathy (left).  Etiology is likely chronic compression and shear injury due to frequent leg crossing and advanced age.  There may be modest neuropathic pain and paresthesias associated with this compressive neuropathy.  In addition, he describes pain in both legs with activity most consistent with claudication.  This is consistent with known history of peripheral vascular disease.    We strongly recommended he re-establish care with vascular team led by Dr. Trula Slade to further assess candidacy for interventions.  For neuropathic pain, we recommended a trial of Gabapentin 300mg  HS, if tolerated.  We reviewed side effects including somnolence.  We appreciate the opportunity to participate in the care of [employer].  He may return to clinic as needed.  All questions were answered. The patient knows to call the clinic with any problems, questions or concerns. No barriers to learning were detected.  The total time spent in the encounter was 40 minutes and more than 50% was on counseling and review of test results   Ventura Sellers, MD Medical Director of Neuro-Oncology [facility] at [place] 08/08/20 9:58 AM

## 2020-09-27 ENCOUNTER — Telehealth: Payer: Self-pay | Admitting: Internal Medicine

## 2020-09-27 NOTE — Telephone Encounter (Signed)
Scheduled appt per 3/30 sch msg. Pt's wife is aware.

## 2020-09-28 ENCOUNTER — Inpatient Hospital Stay: Payer: Medicare Other | Admitting: Internal Medicine

## 2020-09-28 ENCOUNTER — Telehealth: Payer: Self-pay | Admitting: Internal Medicine

## 2020-09-28 NOTE — Telephone Encounter (Signed)
R/s appt per 3/31 sch msg. Pt aware.

## 2020-09-30 ENCOUNTER — Other Ambulatory Visit: Payer: Self-pay

## 2020-09-30 DIAGNOSIS — I70213 Atherosclerosis of native arteries of extremities with intermittent claudication, bilateral legs: Secondary | ICD-10-CM

## 2020-10-03 ENCOUNTER — Inpatient Hospital Stay: Payer: Medicare Other | Attending: Internal Medicine | Admitting: Internal Medicine

## 2020-10-03 ENCOUNTER — Other Ambulatory Visit: Payer: Self-pay

## 2020-10-03 VITALS — BP 133/68 | HR 80 | Temp 96.5°F | Resp 17 | Wt 137.1 lb

## 2020-10-03 DIAGNOSIS — Z79899 Other long term (current) drug therapy: Secondary | ICD-10-CM | POA: Diagnosis not present

## 2020-10-03 DIAGNOSIS — G629 Polyneuropathy, unspecified: Secondary | ICD-10-CM | POA: Insufficient documentation

## 2020-10-03 DIAGNOSIS — C3412 Malignant neoplasm of upper lobe, left bronchus or lung: Secondary | ICD-10-CM

## 2020-10-03 DIAGNOSIS — E785 Hyperlipidemia, unspecified: Secondary | ICD-10-CM | POA: Insufficient documentation

## 2020-10-03 DIAGNOSIS — Z85118 Personal history of other malignant neoplasm of bronchus and lung: Secondary | ICD-10-CM | POA: Diagnosis not present

## 2020-10-03 DIAGNOSIS — I2510 Atherosclerotic heart disease of native coronary artery without angina pectoris: Secondary | ICD-10-CM | POA: Diagnosis not present

## 2020-10-03 DIAGNOSIS — D500 Iron deficiency anemia secondary to blood loss (chronic): Secondary | ICD-10-CM | POA: Diagnosis not present

## 2020-10-03 DIAGNOSIS — Z86010 Personal history of colonic polyps: Secondary | ICD-10-CM | POA: Insufficient documentation

## 2020-10-03 DIAGNOSIS — F17210 Nicotine dependence, cigarettes, uncomplicated: Secondary | ICD-10-CM | POA: Diagnosis not present

## 2020-10-03 DIAGNOSIS — I252 Old myocardial infarction: Secondary | ICD-10-CM | POA: Insufficient documentation

## 2020-10-03 DIAGNOSIS — J449 Chronic obstructive pulmonary disease, unspecified: Secondary | ICD-10-CM | POA: Insufficient documentation

## 2020-10-03 DIAGNOSIS — Z800 Family history of malignant neoplasm of digestive organs: Secondary | ICD-10-CM | POA: Diagnosis not present

## 2020-10-03 DIAGNOSIS — F1010 Alcohol abuse, uncomplicated: Secondary | ICD-10-CM | POA: Diagnosis not present

## 2020-10-03 DIAGNOSIS — Z803 Family history of malignant neoplasm of breast: Secondary | ICD-10-CM | POA: Insufficient documentation

## 2020-10-03 DIAGNOSIS — G939 Disorder of brain, unspecified: Secondary | ICD-10-CM

## 2020-10-03 DIAGNOSIS — I10 Essential (primary) hypertension: Secondary | ICD-10-CM | POA: Insufficient documentation

## 2020-10-03 DIAGNOSIS — R9089 Other abnormal findings on diagnostic imaging of central nervous system: Secondary | ICD-10-CM | POA: Insufficient documentation

## 2020-10-03 DIAGNOSIS — R200 Anesthesia of skin: Secondary | ICD-10-CM | POA: Diagnosis not present

## 2020-10-03 NOTE — Progress Notes (Signed)
[facility] [place] [place], [place] [address] [phone]   Interval Evaluation  Date of Service: 10/03/20 Patient Name: Brian Yates Patient MRN: [id number] Patient DOB: May 30, 1941 Provider: Ventura Sellers, MD  Identifying Statement:  Brian Yates is a 80 y.o. male with abnormal brain MRI, pain syndrome  Referring Provider: Nicoletta Dress, MD [place],  [place] [address]  Primary Cancer: Lung adenocarcinoma  Interval History:  [name] presents today to review symptoms.  He describes ongoing aching pain in his lower legs, again exacerbated by activity or exercise and resolved with rest.  Numbness in the top of the left foot is persistent as well, unchanged from prior.  Denies headaches, seizures.   H+P (06/19/20) Patient presets to clinical to review recent brain imaging and discussion per brain/spine tumor board.  He denies and focal neurologic deficits.  No seizures, headaches, weakness/numbness, no gait impairment.  He underwent screening brain MRI in October 2021 which demonstrated region of restricted diffusion within left hippocampus.  Repeat study was recommended due to concern for metastatic process, which he completed this past week.  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  Medications: Current Outpatient Medications on File Prior to Visit  Medication Sig Dispense Refill  . albuterol (VENTOLIN HFA) 108 (90 Base) MCG/ACT inhaler Inhale 2 puffs into the lungs in the morning, at noon, in the evening, and at bedtime.    Marland Kitchen atorvastatin (LIPITOR) 40 MG tablet Take 20 mg by mouth every evening.     . brimonidine (ALPHAGAN P) 0.1 % SOLN Place 1 drop into both eyes in the morning and at bedtime.     . fenofibrate micronized (LOFIBRA) 134 MG capsule Take 134 mg by mouth every evening.     . gabapentin (NEURONTIN) 300 MG capsule Take 1 capsule (300 mg total) by  mouth at bedtime. 60 capsule 2  . hydrOXYzine (ATARAX/VISTARIL) 50 MG tablet Take 50 mg by mouth at bedtime as needed (sleep).     . isosorbide mononitrate (IMDUR) 30 MG 24 hr tablet Take 30 mg by mouth daily.    Javier Docker Oil 500 MG CAPS Take 500 mg by mouth daily.    Marland Kitchen latanoprost (XALATAN) 0.005 % ophthalmic solution Place 1 drop into both eyes at bedtime.    Marland Kitchen LORazepam (ATIVAN) 0.5 MG tablet 1 tab po 30 minutes prior to MRI, may repeat prn (Patient not taking: Reported on 08/08/2020) 2 tablet 0  . Moringa Oleifera (MORINGA PO) Take 1,400 mg by mouth daily.     . nitroGLYCERIN (NITROSTAT) 0.4 MG SL tablet Place 1 tablet (0.4 mg total) under the tongue every 5 (five) minutes x 3 doses as needed for chest pain. (Patient not taking: Reported on 08/08/2020) 25 tablet 3  . OVER THE COUNTER MEDICATION Take 2 capsules by mouth in the morning and at bedtime. Neuropathy & Nerve Support Supplement with 600 mg Alpha Lipoic Acid    . Probiotic Product (PROBIOTIC PO) Take 1 capsule by mouth daily.    . tamsulosin (FLOMAX) 0.4 MG CAPS capsule Take 0.4 mg by mouth at bedtime.    . temazepam (RESTORIL) 30 MG capsule Take 30 mg by mouth at bedtime as needed for sleep.     . traZODone (DESYREL) 150 MG tablet Take 150 mg by mouth at bedtime as needed for sleep.    Marland Kitchen triamcinolone cream (KENALOG) 0.1 % Apply 1  application topically 3 (three) times daily as needed (itching.).     No current facility-administered medications on file prior to visit.    Allergies:  Allergies  Allergen Reactions  . Bentyl [Dicyclomine Hcl] Other (See Comments)    Hallucinations   . Codeine Sulfate [Codeine] Nausea And Vomiting  . Lisinopril Other (See Comments)    unknown  . Valsartan Other (See Comments)    unknown   Past Medical History:  Past Medical History:  Diagnosis Date  . Alcohol abuse   . Anemia due to gastrointestinal blood loss   . AR (allergic rhinitis)   . 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  Cervical disc disease   . Cervical radiculopathy   . COLD (chronic obstructive lung disease) ([place])   . Controlled gout   . Coronary atherosclerosis of native coronary artery   . Diverticulosis   . Diverticulosis of colon with hemorrhage   . Erectile dysfunction   . FH: colon cancer   . Glaucoma   . History of hepatitis B   . History of skin cancer   . Hx of colonic polyps   . Hyperlipidemia   . Hypertension   . Irritable bowel syndrome   . Mass of jaw   . Neuropathy of left foot   . Previous myocardial infarction older than 8 weeks 1998  . Tobacco abuse   . Tremor   . Vitamin B12 deficiency anemia    Past Surgical History:  Past Surgical History:  Procedure Laterality Date  . CATARACT EXTRACTION Bilateral   . HERNIA REPAIR Bilateral   . LARYNX SURGERY     Social History:  Social History   Socioeconomic History  . Marital status: Married    Spouse name: Not on file  . Number of children: Not on file  . Years of education: Not on file  . Highest education level: Not on file  Occupational History  . Occupation: Retired  Tobacco Use  . Smoking status: Current Every Day Smoker    Packs/day: 1.50  . Smokeless tobacco: Never Used  . Tobacco comment: Trying to quit  Vaping Use  . Vaping Use: Never used  Substance and Sexual Activity  . Alcohol use: No  . Drug use: No  . Sexual activity: Not on file  Other Topics Concern  . Not on file  Social History Narrative  . Not on file   Social Determinants of Health   Financial Resource Strain: Not on file  Food Insecurity: Not on file  Transportation Needs: Not on file  Physical Activity: Not on file  Stress: Not on file  Social Connections: Not on file  Intimate Partner Violence: Not on file   Family History:  Family History  Problem Relation Age of Onset  . Hypertension Mother   . Hyperlipidemia Mother   . Colon cancer Mother   . Cancer - Other Mother   . Hypertension Father   . Hyperlipidemia Father   .  Heart disease Father   . CAD Father   . Heart attack Father   . Alcohol abuse Father   . Breast cancer Sister   . Stroke Maternal Aunt   . Heart attack Paternal Grandfather   . Heart attack Paternal Uncle     Review of Systems: Constitutional: Doesn't report fevers, chills or abnormal weight loss Eyes: Doesn't report blurriness of vision Ears, nose, mouth, throat, and face: Doesn't report sore throat Respiratory: Doesn't report cough, dyspnea or wheezes Cardiovascular: Doesn't  report palpitation, chest discomfort  Gastrointestinal:  Doesn't report nausea, constipation, diarrhea GU: Doesn't report incontinence Skin: Doesn't report skin rashes Neurological: Per HPI Musculoskeletal: Doesn't report joint pain Behavioral/Psych: Doesn't report anxiety  Physical Exam: Vitals:   10/03/20 1129  BP: 133/68  Pulse: 80  Resp: 17  Temp: (!) 96.5 F (35.8 C)   KPS: 80. General: Alert, cooperative, pleasant, in no acute distress Head: Normal EENT: No conjunctival injection or scleral icterus.  Lungs: Resp effort normal Cardiac: Regular rate Abdomen: Non-distended abdomen Skin: No rashes cyanosis or petechiae. Extremities: No clubbing or edema  Neurologic Exam: Mental Status: Awake, alert, attentive to examiner. Oriented to self and environment. Language is fluent with intact comprehension.  Cranial Nerves: Visual acuity is grossly normal. Visual fields are full. Extra-ocular movements intact. No ptosis. Face is symmetric Motor: Tone and bulk are normal. Slight weakness of dorsiflexion, left ankle. Loss of ankle jerk on left, no pathologic reflexes present.  Sensory: Impaired in superior aspect left foot  Gait: Deferred   Labs: I have reviewed the data as listed No results found for: NA, K, CL, CO2, GLUCOSE, BUN, CREATININE, CALCIUM, PROT, ALBUMIN, AST, ALT, ALKPHOS, BILITOT, GFRNONAA, GFRAA Lab Results  Component Value Date   WBC 11.1 (H) 02/23/2020   HGB 12.7 (L) 02/23/2020    HCT 38.5 (L) 02/23/2020   MCV 88.9 02/23/2020   PLT 193 02/23/2020     Assessment/Plan Abnormal Brain MRI  Marcello Moores A Borghi is clinically stable today; continues to present with mildly symptomatic complete peroneal neuropathy (left).  Etiology is likely chronic compression and shear injury due to frequent leg crossing and advanced age.    For claudication and suspected PVD, he is scheduled to meet with Dr. Stephens Shire team later this month.  May use gabapenting 300mg  HS as needed for neuropathic pain.  Will refer out to Dr. Hinton Rao in [place] for lung cancer follow up, evaluation for leukocytosis (we don't have copy of labs here).  We appreciate the opportunity to participate in the care of [employer].  He may return to clinic as needed.  All questions were answered. The patient knows to call the clinic with any problems, questions or concerns. No barriers to learning were detected.  The total time spent in the encounter was 30 minutes and more than 50% was on counseling and review of test results   Ventura Sellers, MD Medical Director of Neuro-Oncology [facility] at [place] 10/03/20 11:25 AM

## 2020-10-06 ENCOUNTER — Telehealth: Payer: Self-pay | Admitting: Oncology

## 2020-10-06 NOTE — Telephone Encounter (Signed)
Patient requested Transfer of Care from [facility] for Hx: Lung CA/Abnormal Labs.  Appt made for 10/17/20 Labs 10:30 am - Consult 11:00 am  Records in [facility] Chart

## 2020-10-16 ENCOUNTER — Ambulatory Visit (HOSPITAL_COMMUNITY)
Admission: RE | Admit: 2020-10-16 | Discharge: 2020-10-16 | Disposition: A | Discharge status: Home / Self Care | Payer: Medicare Other | Source: Ambulatory Visit | Attending: Surgery | Admitting: Surgery

## 2020-10-16 ENCOUNTER — Other Ambulatory Visit: Payer: Self-pay

## 2020-10-16 ENCOUNTER — Ambulatory Visit (INDEPENDENT_AMBULATORY_CARE_PROVIDER_SITE_OTHER): Payer: Medicare Other | Admitting: Surgery

## 2020-10-16 ENCOUNTER — Encounter: Payer: Self-pay | Admitting: Surgery

## 2020-10-16 VITALS — BP 134/75 | HR 61 | Temp 97.8°F | Resp 16 | Ht 68.0 in | Wt 139.5 lb

## 2020-10-16 DIAGNOSIS — I70213 Atherosclerosis of native arteries of extremities with intermittent claudication, bilateral legs: Secondary | ICD-10-CM

## 2020-10-16 NOTE — Progress Notes (Signed)
[facility]  [address] [place],  [place]  [address] [phone]  Clinic Day:  10/17/2020  Referring physician: Ventura Sellers, MD   HISTORY OF PRESENT ILLNESS:  The patient is a 80 y.o. male  who I was asked to consult upon for the continued surveillance of his lung cancer.  His history dates back to the spring of 2021 when he began having coughing and congestion.  This led to a chest x-ray being done in July 2021, which incidentally revealed a 1.4 cm left upper lobe lung nodule.  A chest CT done a week later confirmed this mass, measuring 2.1 cm.  A PET scan done afterwards showed no other ominous findings.  It eventually led to him undergoing a biopsy of this lesion, which came back positive for adenocarcinoma.  He eventually underwent 3 treatments of stereotactic radiation in September 2021.  Since then, follow-up scans have shown stability in this lung nodule.  The patient still has issues with phlegm production, but denies having hemoptysis or other respiratory symptoms which concern him for disease progression.  Unfortunately, he continues to smoke fairly heavily on a daily basis.  PAST MEDICAL HISTORY:   Past Medical History:  Diagnosis Date  . Alcohol abuse   . Anemia due to gastrointestinal blood loss   . AR (allergic rhinitis)   . 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 Cervical disc disease   . Cervical radiculopathy   . COLD (chronic obstructive lung disease) ([place])   . Controlled gout   . Coronary atherosclerosis of native coronary artery   . Diverticulosis   . Diverticulosis of colon with hemorrhage   . Erectile dysfunction   . FH: colon cancer   . Glaucoma   . History of hepatitis B   . History of skin cancer   . Hx of colonic polyps   . Hyperlipidemia   . Hypertension   . Irritable bowel syndrome   . Mass of jaw   . Neuropathy of left foot   . Previous myocardial infarction older than 8 weeks 1998  . Tobacco abuse    . Tremor   . Vitamin B12 deficiency anemia     PAST SURGICAL HISTORY:   Past Surgical History:  Procedure Laterality Date  . CATARACT EXTRACTION Bilateral   . HERNIA REPAIR Bilateral   . LARYNX SURGERY      CURRENT MEDICATIONS:   Current Outpatient Medications  Medication Sig Dispense Refill  . albuterol (VENTOLIN HFA) 108 (90 Base) MCG/ACT inhaler Inhale 2 puffs into the lungs in the morning, at noon, in the evening, and at bedtime.    . ALPRAZolam (XANAX) 0.5 MG tablet Take 0.5 mg by mouth at bedtime.    Marland Kitchen atorvastatin (LIPITOR) 40 MG tablet Take 20 mg by mouth every evening.     . brimonidine (ALPHAGAN P) 0.1 % SOLN Place 1 drop into both eyes in the morning and at bedtime.     . Dupilumab (DUPIXENT) 300 MG/2ML SOPN Inject 300 mg into the skin every 14 (fourteen) days.    . fenofibrate micronized (LOFIBRA) 134 MG capsule Take 134 mg by mouth every evening.     . gabapentin (NEURONTIN) 300 MG capsule Take 1 capsule (300 mg total) by mouth at bedtime. (Patient not taking: Reported on 10/17/2020) 60 capsule 2  . hydrOXYzine (ATARAX/VISTARIL) 50 MG tablet Take 100 mg by mouth at bedtime as needed (sleep).    . isosorbide mononitrate (IMDUR) 30  MG 24 hr tablet Take 30 mg by mouth in the morning.    . latanoprost (XALATAN) 0.005 % ophthalmic solution Place 1 drop into both eyes at bedtime.    Marland Kitchen LORazepam (ATIVAN) 0.5 MG tablet 1 tab po 30 minutes prior to MRI, may repeat prn (Patient not taking: Reported on 10/17/2020) 2 tablet 0  . Moringa Oleifera (MORINGA PO) Take 1,400 mg by mouth daily in the afternoon.    . nitroGLYCERIN (NITROSTAT) 0.4 MG SL tablet Place 1 tablet (0.4 mg total) under the tongue every 5 (five) minutes x 3 doses as needed for chest pain. 25 tablet 3  . propranolol (INDERAL) 10 MG tablet Take 10 mg by mouth 3 (three) times daily.    . tamsulosin (FLOMAX) 0.4 MG CAPS capsule Take 0.4 mg by mouth at bedtime.    . triamcinolone cream (KENALOG) 0.1 % Apply 1 application  topically 4 (four) times daily as needed (itching.).     No current facility-administered medications for this visit.    ALLERGIES:   Allergies  Allergen Reactions  . Bentyl [Dicyclomine Hcl] Other (See Comments)    Hallucinations   . Codeine Sulfate [Codeine] Nausea And Vomiting  . Lisinopril Other (See Comments)    unknown  . Valsartan Other (See Comments)    unknown    FAMILY HISTORY:   Family History  Problem Relation Age of Onset  . Hypertension Mother   . Hyperlipidemia Mother   . Colon cancer Mother   . Cancer - Other Mother   . Hypertension Father   . Hyperlipidemia Father   . Heart disease Father   . CAD Father   . Heart attack Father   . Alcohol abuse Father   . Breast cancer Sister   . Stroke Maternal Aunt   . Heart attack Paternal Grandfather   . Heart attack Paternal Uncle     SOCIAL HISTORY:  The patient was born and raised in [place], [place].  He lives [place] with his wife of 47 years.  He was an Community education officer for 21 years.  We worked in the [employer] for 20 years.  He has smoked up to 2 packs of cigarettes daily for 65 years.  He had alcoholism, but stopped drinking 6 years ago.  REVIEW OF SYSTEMS:  Review of Systems  Constitutional: Positive for fatigue and unexpected weight change. Negative for fever.  HENT:   Positive for hearing loss.   Respiratory: Positive for shortness of breath. Negative for chest tightness, cough and hemoptysis.   Cardiovascular: Negative for chest pain and palpitations.  Gastrointestinal: Positive for constipation. Negative for abdominal distention, abdominal pain, blood in stool, diarrhea, nausea and vomiting.  Genitourinary: Negative for dysuria, frequency and hematuria.   Musculoskeletal: Negative for arthralgias, back pain and myalgias.  Skin: Positive for itching. Negative for rash.  Neurological: Negative for dizziness, headaches and light-headedness.  Psychiatric/Behavioral: Negative for depression  and suicidal ideas. The patient is nervous/anxious.      PHYSICAL EXAM:  Blood pressure (!) 143/66, pulse 75, temperature 98.5 F (36.9 C), resp. rate 18, height 5\' 8"  (1.727 m), weight 137 lb 14.4 oz (62.6 kg), SpO2 96 %. Wt Readings from Last 3 Encounters:  10/17/20 137 lb 14.4 oz (62.6 kg)  10/16/20 139 lb 8 oz (63.3 kg)  10/03/20 137 lb 1.6 oz (62.2 kg)   Body mass index is 20.97 kg/m. Performance status (ECOG): 1 - Symptomatic but completely ambulatory Physical Exam Constitutional:  Appearance: Normal appearance. He is not ill-appearing.  HENT:     Mouth/Throat:     Mouth: Mucous membranes are moist.     Pharynx: Oropharynx is clear. No oropharyngeal exudate or posterior oropharyngeal erythema.  Cardiovascular:     Rate and Rhythm: Normal rate and regular rhythm.     Heart sounds: No murmur heard. No friction rub. No gallop.   Pulmonary:     Effort: Pulmonary effort is normal. No respiratory distress.     Breath sounds: No wheezing, rhonchi or rales.     Comments: Decreased breath sounds bilaterally Chest:  Breasts:     Right: No axillary adenopathy or supraclavicular adenopathy.     Left: No axillary adenopathy or supraclavicular adenopathy.    Abdominal:     General: Bowel sounds are normal. There is no distension.     Palpations: Abdomen is soft. There is no mass.     Tenderness: There is no abdominal tenderness.  Musculoskeletal:        General: No swelling.     Right lower leg: No edema.     Left lower leg: No edema.  Lymphadenopathy:     Cervical: No cervical adenopathy.     Upper Body:     Right upper body: No supraclavicular or axillary adenopathy.     Left upper body: No supraclavicular or axillary adenopathy.     Lower Body: No right inguinal adenopathy. No left inguinal adenopathy.  Skin:    General: Skin is warm.     Coloration: Skin is not jaundiced.     Findings: No lesion or rash.  Neurological:     General: No focal deficit present.      Mental Status: He is alert and oriented to person, place, and time. Mental status is at baseline.     Cranial Nerves: Cranial nerves are intact.  Psychiatric:        Mood and Affect: Mood normal.        Behavior: Behavior normal.        Thought Content: Thought content normal.    ASSESSMENT & PLAN:  A 80 y.o. 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  A chest CT done in March 2022 showed no enlargement of this lung mass.  Moving forward, his lung cancer will be followed with chest CT's every 4 months for his first year after stereotactic radiation.  Afterwards, I will alternate chest x-rays with chest CT's every 4 months.  I strongly encouraged this gentleman to abstain from smoking as to not precipitate an additional aerodigestive malignancy or severe cardiovascular insult from impacting his life.  I will see this gentleman back in August 2022 for repeat clinical assessment, with a chest CT being done a day before his next visit for his continued radiographic lung cancer surveillance.  The patient understands all the plans discussed today and is in agreement with them.  I do appreciate Vaslow, Acey Lav, MD for his new consult.   Kline Bulthuis Macarthur Critchley, MD

## 2020-10-16 NOTE — Progress Notes (Signed)
[facility]  Patient name: Brian Yates MRN: [id number] DOB: March 04, 1941 Sex: male   REQUESTING PROVIDER:    Dr. Mickeal Skinner   REASON FOR CONSULT:    PAD  HISTORY OF PRESENT ILLNESS:   Brian Yates is a 80 y.o. male, who I last saw in 2019.  At that time, he had been having difficulty with his legs and walking for a long period of time however it had gotten progressively worse.  He was able to walk approximately 1/4 mile before he had to stop secondary to tightening in his calves, the right bothers him more than the left.  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  Medical management was recommended.  He was not given Pletal because of a history of a GI bleed.    He is back today due to worsening symptoms.  He now gets calf cramping less than 100 feet.  Right leg may be slightly worse.  He does not have rest pain.  He does not have any open ulcerations  He has a history of coronary artery disease.  He has been told that he has had a heart attack in the past.  He is on a statin for hypercholesterolemia.  He is blood pressure is medically managed.  He has been treated for lung cancer  PAST MEDICAL HISTORY    Past Medical History:  Diagnosis Date  . Alcohol abuse   . Anemia due to gastrointestinal blood loss   . AR (allergic rhinitis)   . 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 Cervical disc disease   . Cervical radiculopathy   . COLD (chronic obstructive lung disease) ([place])   . Controlled gout   . Coronary atherosclerosis of native coronary artery   . Diverticulosis   . Diverticulosis of colon with hemorrhage   . Erectile dysfunction   . FH: colon cancer   . Glaucoma   . History of hepatitis B   . History of skin cancer   . Hx of colonic polyps   . Hyperlipidemia   . Hypertension   . Irritable bowel syndrome   . Mass of jaw   . Neuropathy of left foot   . Previous  myocardial infarction older than 8 weeks 1998  . Tobacco abuse   . Tremor   . Vitamin B12 deficiency anemia      FAMILY HISTORY   Family History  Problem Relation Age of Onset  . Hypertension Mother   . Hyperlipidemia Mother   . Colon cancer Mother   . Cancer - Other Mother   . Hypertension Father   . Hyperlipidemia Father   . Heart disease Father   . CAD Father   . Heart attack Father   . Alcohol abuse Father   . Breast cancer Sister   . Stroke Maternal Aunt   . Heart attack Paternal Grandfather   . Heart attack Paternal Uncle     SOCIAL HISTORY:   Social History   Socioeconomic History  . Marital status: Married    Spouse name: Not on file  . Number of children: Not on file  . Years of education: Not on file  . Highest education level: Not on file  Occupational History  . Occupation: Retired  Tobacco Use  . Smoking status: Current Every Day Smoker    Packs/day: 1.50  . Smokeless tobacco: Never Used  . Tobacco comment: Trying to quit  Vaping Use  . Vaping Use:  Never used  Substance and Sexual Activity  . Alcohol use: No  . Drug use: No  . Sexual activity: Not on file  Other Topics Concern  . Not on file  Social History Narrative  . Not on file   Social Determinants of Health   Financial Resource Strain: Not on file  Food Insecurity: Not on file  Transportation Needs: Not on file  Physical Activity: Not on file  Stress: Not on file  Social Connections: Not on file  Intimate Partner Violence: Not on file    ALLERGIES:    Allergies  Allergen Reactions  . Bentyl [Dicyclomine Hcl] Other (See Comments)    Hallucinations   . Codeine Sulfate [Codeine] Nausea And Vomiting  . Lisinopril Other (See Comments)    unknown  . Valsartan Other (See Comments)    unknown    CURRENT MEDICATIONS:    Current Outpatient Medications  Medication Sig Dispense Refill  . albuterol (VENTOLIN HFA) 108 (90 Base) MCG/ACT inhaler Inhale 2 puffs into the lungs in  the morning, at noon, in the evening, and at bedtime.    . ALPRAZolam (XANAX) 0.5 MG tablet Take 0.5 mg by mouth at bedtime.    Marland Kitchen atorvastatin (LIPITOR) 40 MG tablet Take 20 mg by mouth every evening.     . brimonidine (ALPHAGAN P) 0.1 % SOLN Place 1 drop into both eyes in the morning and at bedtime.     . dicyclomine (BENTYL) 20 MG tablet Take 20 mg by mouth 4 (four) times daily.    . fenofibrate micronized (LOFIBRA) 134 MG capsule Take 134 mg by mouth every evening.     . gabapentin (NEURONTIN) 300 MG capsule Take 1 capsule (300 mg total) by mouth at bedtime. 60 capsule 2  . hydrOXYzine (ATARAX/VISTARIL) 25 MG tablet Take 25 mg by mouth 4 (four) times daily.    . hydrOXYzine (ATARAX/VISTARIL) 50 MG tablet Take 50 mg by mouth at bedtime as needed (sleep).  (Patient not taking: Reported on 10/03/2020)    . isosorbide mononitrate (IMDUR) 30 MG 24 hr tablet Take 30 mg by mouth daily.    Javier Docker Oil 500 MG CAPS Take 500 mg by mouth daily. (Patient not taking: Reported on 10/03/2020)    . latanoprost (XALATAN) 0.005 % ophthalmic solution Place 1 drop into both eyes at bedtime.    Marland Kitchen levofloxacin (LEVAQUIN) 750 MG tablet Take 750 mg by mouth daily.    Marland Kitchen LORazepam (ATIVAN) 0.5 MG tablet 1 tab po 30 minutes prior to MRI, may repeat prn (Patient not taking: No sig reported) 2 tablet 0  . Moringa Oleifera (MORINGA PO) Take 1,400 mg by mouth daily.     . nitroGLYCERIN (NITROSTAT) 0.4 MG SL tablet Place 1 tablet (0.4 mg total) under the tongue every 5 (five) minutes x 3 doses as needed for chest pain. (Patient not taking: No sig reported) 25 tablet 3  . ondansetron (ZOFRAN-ODT) 4 MG disintegrating tablet Take 4 mg by mouth 4 (four) times daily as needed.    Marland Kitchen OVER THE COUNTER MEDICATION Take 2 capsules by mouth in the morning and at bedtime. Neuropathy & Nerve Support Supplement with 600 mg Alpha Lipoic Acid    . predniSONE (DELTASONE) 10 MG tablet Take by mouth.    . Probiotic Product (PROBIOTIC PO) Take 1  capsule by mouth daily.    . propranolol (INDERAL) 10 MG tablet Take 10 mg by mouth 3 (three) times daily.    . tamsulosin (FLOMAX) 0.4 MG  CAPS capsule Take 0.4 mg by mouth at bedtime.    . temazepam (RESTORIL) 30 MG capsule Take 30 mg by mouth at bedtime as needed for sleep.  (Patient not taking: Reported on 10/03/2020)    . traMADol (ULTRAM) 50 MG tablet Take 50 mg by mouth every 4 (four) hours as needed.    . traZODone (DESYREL) 150 MG tablet Take 150 mg by mouth at bedtime as needed for sleep. (Patient not taking: Reported on 10/03/2020)    . triamcinolone cream (KENALOG) 0.1 % Apply 1 application topically 3 (three) times daily as needed (itching.).     No current facility-administered medications for this visit.    REVIEW OF SYSTEMS:   [X]  denotes positive finding, [ ]  denotes negative finding Cardiac  Comments:  Chest pain or chest pressure:    Shortness of breath upon exertion:    Short of breath when lying flat:    Irregular heart rhythm:        Vascular    Pain in calf, thigh, or hip brought on by ambulation: x   Pain in feet at night that wakes you up from your sleep:     Blood clot in your veins:    Leg swelling:         Pulmonary    Oxygen at home:    Productive cough:     Wheezing:         Neurologic    Sudden weakness in arms or legs:     Sudden numbness in arms or legs:     Sudden onset of difficulty speaking or slurred speech:    Temporary loss of vision in one eye:     Problems with dizziness:         Gastrointestinal    Blood in stool:      Vomited blood:         Genitourinary    Burning when urinating:     Blood in urine:        Psychiatric    Major depression:         Hematologic    Bleeding problems:    Problems with blood clotting too easily:        Skin    Rashes or ulcers:        Constitutional    Fever or chills:     PHYSICAL EXAM:   There were no vitals filed for this visit.  GENERAL: The patient is a well-nourished male, in no  acute distress. The vital signs are documented above. CARDIAC: There is a regular rate and rhythm.  VASCULAR: Palpable left femoral pulse.  I had difficulty palpating the right femoral pulse.  Pedal pulses are nonpalpable PULMONARY: Nonlabored respirations ABDOMEN: Soft and non-tender.  Left inguinal hernia MUSCULOSKELETAL: There are no major deformities or cyanosis. NEUROLOGIC: No focal weakness or paresthesias are detected. SKIN: There are no ulcers or rashes noted. PSYCHIATRIC: The patient has a normal affect.  STUDIES:   I have reviewed the following studies: ABI/TBIToday's ABIToday's TBIPrevious ABIPrevious TBI  +-------+-----------+-----------+------------+------------+  Right 0.32    0.22    0.37    0.26      +-------+-----------+-----------+------------+------------+  Left  0.25    0     0.49    0.32      +-------+-----------+-----------+------------+------------+  ASSESSMENT and PLAN   Bilateral claudication, right greater than left: I discussed that because of his symptoms, we should proceed with angiography.  This will be through a left femoral  approach with bilateral runoff and possible intervention of the right leg.  I suspect he has inflow and outflow disease on the right.  I told him I would try to correct everything I could while he was having his arteriogram.  He understands that he may require surgery.  We discussed the risk of bleeding and embolization.  He has been put on the schedule for April 26.   Leia Alf, MD, FACS [facility] [phone] Pager [phone]

## 2020-10-16 NOTE — H&P (View-Only) (Signed)
[facility] [place]  Patient name: Brian Yates MRN: [id number] DOB: 03-Jan-1941 Sex: male   REQUESTING PROVIDER:    Dr. Mickeal Skinner   REASON FOR CONSULT:    PAD  HISTORY OF PRESENT ILLNESS:   Brian Yates is a 80 y.o. male, who I last saw in 2019.  At that time, he had been having difficulty with his legs and walking for a long period of time however it had gotten progressively worse.  He was able to walk approximately 1/4 mile before he had to stop secondary to tightening in his calves, the right bothers him more than the left.  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  Medical management was recommended.  He was not given Pletal because of a history of a GI bleed.    He is back today due to worsening symptoms.  He now gets calf cramping less than 100 feet.  Right leg may be slightly worse.  He does not have rest pain.  He does not have any open ulcerations  He has a history of coronary artery disease.  He has been told that he has had a heart attack in the past.  He is on a statin for hypercholesterolemia.  He is blood pressure is medically managed.  He has been treated for lung cancer  PAST MEDICAL HISTORY    Past Medical History:  Diagnosis Date  . Alcohol abuse   . Anemia due to gastrointestinal blood loss   . AR (allergic rhinitis)   . 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 Cervical disc disease   . Cervical radiculopathy   . COLD (chronic obstructive lung disease) ([place])   . Controlled gout   . Coronary atherosclerosis of native coronary artery   . Diverticulosis   . Diverticulosis of colon with hemorrhage   . Erectile dysfunction   . FH: colon cancer   . Glaucoma   . History of hepatitis B   . History of skin cancer   . Hx of colonic polyps   . Hyperlipidemia   . Hypertension   . Irritable bowel syndrome   . Mass of jaw   . Neuropathy of left foot   . Previous  myocardial infarction older than 8 weeks 1998  . Tobacco abuse   . Tremor   . Vitamin B12 deficiency anemia      FAMILY HISTORY   Family History  Problem Relation Age of Onset  . Hypertension Mother   . Hyperlipidemia Mother   . Colon cancer Mother   . Cancer - Other Mother   . Hypertension Father   . Hyperlipidemia Father   . Heart disease Father   . CAD Father   . Heart attack Father   . Alcohol abuse Father   . Breast cancer Sister   . Stroke Maternal Aunt   . Heart attack Paternal Grandfather   . Heart attack Paternal Uncle     SOCIAL HISTORY:   Social History   Socioeconomic History  . Marital status: Married    Spouse name: Not on file  . Number of children: Not on file  . Years of education: Not on file  . Highest education level: Not on file  Occupational History  . Occupation: Retired  Tobacco Use  . Smoking status: Current Every Day Smoker    Packs/day: 1.50  . Smokeless tobacco: Never Used  . Tobacco comment: Trying to quit  Vaping Use  . Vaping Use:  Never used  Substance and Sexual Activity  . Alcohol use: No  . Drug use: No  . Sexual activity: Not on file  Other Topics Concern  . Not on file  Social History Narrative  . Not on file   Social Determinants of Health   Financial Resource Strain: Not on file  Food Insecurity: Not on file  Transportation Needs: Not on file  Physical Activity: Not on file  Stress: Not on file  Social Connections: Not on file  Intimate Partner Violence: Not on file    ALLERGIES:    Allergies  Allergen Reactions  . Bentyl [Dicyclomine Hcl] Other (See Comments)    Hallucinations   . Codeine Sulfate [Codeine] Nausea And Vomiting  . Lisinopril Other (See Comments)    unknown  . Valsartan Other (See Comments)    unknown    CURRENT MEDICATIONS:    Current Outpatient Medications  Medication Sig Dispense Refill  . albuterol (VENTOLIN HFA) 108 (90 Base) MCG/ACT inhaler Inhale 2 puffs into the lungs in  the morning, at noon, in the evening, and at bedtime.    . ALPRAZolam (XANAX) 0.5 MG tablet Take 0.5 mg by mouth at bedtime.    Marland Kitchen atorvastatin (LIPITOR) 40 MG tablet Take 20 mg by mouth every evening.     . brimonidine (ALPHAGAN P) 0.1 % SOLN Place 1 drop into both eyes in the morning and at bedtime.     . dicyclomine (BENTYL) 20 MG tablet Take 20 mg by mouth 4 (four) times daily.    . fenofibrate micronized (LOFIBRA) 134 MG capsule Take 134 mg by mouth every evening.     . gabapentin (NEURONTIN) 300 MG capsule Take 1 capsule (300 mg total) by mouth at bedtime. 60 capsule 2  . hydrOXYzine (ATARAX/VISTARIL) 25 MG tablet Take 25 mg by mouth 4 (four) times daily.    . hydrOXYzine (ATARAX/VISTARIL) 50 MG tablet Take 50 mg by mouth at bedtime as needed (sleep).  (Patient not taking: Reported on 10/03/2020)    . isosorbide mononitrate (IMDUR) 30 MG 24 hr tablet Take 30 mg by mouth daily.    Javier Docker Oil 500 MG CAPS Take 500 mg by mouth daily. (Patient not taking: Reported on 10/03/2020)    . latanoprost (XALATAN) 0.005 % ophthalmic solution Place 1 drop into both eyes at bedtime.    Marland Kitchen levofloxacin (LEVAQUIN) 750 MG tablet Take 750 mg by mouth daily.    Marland Kitchen LORazepam (ATIVAN) 0.5 MG tablet 1 tab po 30 minutes prior to MRI, may repeat prn (Patient not taking: No sig reported) 2 tablet 0  . Moringa Oleifera (MORINGA PO) Take 1,400 mg by mouth daily.     . nitroGLYCERIN (NITROSTAT) 0.4 MG SL tablet Place 1 tablet (0.4 mg total) under the tongue every 5 (five) minutes x 3 doses as needed for chest pain. (Patient not taking: No sig reported) 25 tablet 3  . ondansetron (ZOFRAN-ODT) 4 MG disintegrating tablet Take 4 mg by mouth 4 (four) times daily as needed.    Marland Kitchen OVER THE COUNTER MEDICATION Take 2 capsules by mouth in the morning and at bedtime. Neuropathy & Nerve Support Supplement with 600 mg Alpha Lipoic Acid    . predniSONE (DELTASONE) 10 MG tablet Take by mouth.    . Probiotic Product (PROBIOTIC PO) Take 1  capsule by mouth daily.    . propranolol (INDERAL) 10 MG tablet Take 10 mg by mouth 3 (three) times daily.    . tamsulosin (FLOMAX) 0.4 MG  CAPS capsule Take 0.4 mg by mouth at bedtime.    . temazepam (RESTORIL) 30 MG capsule Take 30 mg by mouth at bedtime as needed for sleep.  (Patient not taking: Reported on 10/03/2020)    . traMADol (ULTRAM) 50 MG tablet Take 50 mg by mouth every 4 (four) hours as needed.    . traZODone (DESYREL) 150 MG tablet Take 150 mg by mouth at bedtime as needed for sleep. (Patient not taking: Reported on 10/03/2020)    . triamcinolone cream (KENALOG) 0.1 % Apply 1 application topically 3 (three) times daily as needed (itching.).     No current facility-administered medications for this visit.    REVIEW OF SYSTEMS:   [X]  denotes positive finding, [ ]  denotes negative finding Cardiac  Comments:  Chest pain or chest pressure:    Shortness of breath upon exertion:    Short of breath when lying flat:    Irregular heart rhythm:        Vascular    Pain in calf, thigh, or hip brought on by ambulation: x   Pain in feet at night that wakes you up from your sleep:     Blood clot in your veins:    Leg swelling:         Pulmonary    Oxygen at home:    Productive cough:     Wheezing:         Neurologic    Sudden weakness in arms or legs:     Sudden numbness in arms or legs:     Sudden onset of difficulty speaking or slurred speech:    Temporary loss of vision in one eye:     Problems with dizziness:         Gastrointestinal    Blood in stool:      Vomited blood:         Genitourinary    Burning when urinating:     Blood in urine:        Psychiatric    Major depression:         Hematologic    Bleeding problems:    Problems with blood clotting too easily:        Skin    Rashes or ulcers:        Constitutional    Fever or chills:     PHYSICAL EXAM:   There were no vitals filed for this visit.  GENERAL: The patient is a well-nourished male, in no  acute distress. The vital signs are documented above. CARDIAC: There is a regular rate and rhythm.  VASCULAR: Palpable left femoral pulse.  I had difficulty palpating the right femoral pulse.  Pedal pulses are nonpalpable PULMONARY: Nonlabored respirations ABDOMEN: Soft and non-tender.  Left inguinal hernia MUSCULOSKELETAL: There are no major deformities or cyanosis. NEUROLOGIC: No focal weakness or paresthesias are detected. SKIN: There are no ulcers or rashes noted. PSYCHIATRIC: The patient has a normal affect.  STUDIES:   I have reviewed the following studies: ABI/TBIToday's ABIToday's TBIPrevious ABIPrevious TBI  +-------+-----------+-----------+------------+------------+  Right 0.32    0.22    0.37    0.26      +-------+-----------+-----------+------------+------------+  Left  0.25    0     0.49    0.32      +-------+-----------+-----------+------------+------------+  ASSESSMENT and PLAN   Bilateral claudication, right greater than left: I discussed that because of his symptoms, we should proceed with angiography.  This will be through a left femoral  approach with bilateral runoff and possible intervention of the right leg.  I suspect he has inflow and outflow disease on the right.  I told him I would try to correct everything I could while he was having his arteriogram.  He understands that he may require surgery.  We discussed the risk of bleeding and embolization.  He has been put on the schedule for April 26.   Leia Alf, MD, FACS [facility] [phone] Pager [phone]

## 2020-10-17 ENCOUNTER — Other Ambulatory Visit: Payer: Self-pay | Admitting: Oncology

## 2020-10-17 ENCOUNTER — Inpatient Hospital Stay: Payer: Medicare Other

## 2020-10-17 ENCOUNTER — Other Ambulatory Visit: Payer: Self-pay

## 2020-10-17 ENCOUNTER — Inpatient Hospital Stay (INDEPENDENT_AMBULATORY_CARE_PROVIDER_SITE_OTHER): Payer: Medicare Other | Admitting: Oncology

## 2020-10-17 VITALS — BP 143/66 | HR 75 | Temp 98.5°F | Resp 18 | Ht 68.0 in | Wt 137.9 lb

## 2020-10-17 DIAGNOSIS — C3412 Malignant neoplasm of upper lobe, left bronchus or lung: Secondary | ICD-10-CM

## 2020-10-17 DIAGNOSIS — I70213 Atherosclerosis of native arteries of extremities with intermittent claudication, bilateral legs: Secondary | ICD-10-CM

## 2020-10-19 ENCOUNTER — Telehealth: Payer: Self-pay | Admitting: Oncology

## 2020-10-19 NOTE — Telephone Encounter (Signed)
10/19/20 spoke with patient and confirmed all appts

## 2020-10-23 ENCOUNTER — Other Ambulatory Visit (HOSPITAL_COMMUNITY): Payer: Medicare Other

## 2020-10-27 ENCOUNTER — Telehealth: Payer: Self-pay | Admitting: Oncology

## 2020-10-27 NOTE — Telephone Encounter (Signed)
Patient said he returned my call but I think he was confused on who to call back.  I did not Leave him a message to call back  Patient however did have questions on a medication he was taking

## 2020-10-30 ENCOUNTER — Telehealth: Payer: Self-pay | Admitting: *Deleted

## 2020-10-30 NOTE — Telephone Encounter (Signed)
CALLED PATIENT TO INFORM OF LABS ON 11-03-20- 10:30 AM @ [facility] @ 11:30 AM, PATIENT TO HAVE WATER ONLY- 2 HRS. PRIOR TO TEST(TEST TO BE @ [facility]), PATIENT TO RECEIVE RESULTS FROM ALISON PERKINS ON 11-06-20 @ 2 PM WITH RESULTS, PATIENT VERIFIED UNDERSTANDING THESE APPTS.

## 2020-11-01 ENCOUNTER — Telehealth: Payer: Self-pay

## 2020-11-01 NOTE — Telephone Encounter (Signed)
Pt returned my call. He and his wife just wanted to confirm appt times for labs, & scans on Friday.  He also asked if the vascular surgeon could do the surgery on the 10th with his wbc 17K.  I told him the vascular surgeon (Dr Trula Slade), has access to his chart, including labs and scans within Epic. Pt will receive call from Shona Simpson on Monday, 11/06/2020 regarding results of labs & scans. Both verbalized understanding.

## 2020-11-06 ENCOUNTER — Other Ambulatory Visit (HOSPITAL_COMMUNITY)
Admission: RE | Admit: 2020-11-06 | Discharge: 2020-11-06 | Disposition: A | Discharge status: Home / Self Care | Payer: Medicare Other | Source: Ambulatory Visit | Attending: Surgery | Admitting: Surgery

## 2020-11-06 ENCOUNTER — Ambulatory Visit
Admission: RE | Admit: 2020-11-06 | Discharge: 2020-11-06 | Disposition: A | Discharge status: Home / Self Care | Payer: Medicare Other | Source: Ambulatory Visit | Attending: Radiation Oncology | Admitting: Radiation Oncology

## 2020-11-06 DIAGNOSIS — Z01812 Encounter for preprocedural laboratory examination: Secondary | ICD-10-CM | POA: Diagnosis present

## 2020-11-06 DIAGNOSIS — C3412 Malignant neoplasm of upper lobe, left bronchus or lung: Secondary | ICD-10-CM

## 2020-11-06 DIAGNOSIS — Z20822 Contact with and (suspected) exposure to covid-19: Secondary | ICD-10-CM | POA: Diagnosis not present

## 2020-11-06 LAB — SARS CORONAVIRUS 2 (TAT 6-24 HRS): SARS Coronavirus 2: NEGATIVE

## 2020-11-06 NOTE — Progress Notes (Signed)
As I was preparing for the patient's visit today, I noticed that he had already seen and met with Dr. Bobby Rumpf in medical oncology. I did still review the CT scan results from 11/03/20 and agree with Dr. Bobby Rumpf' plan to follow the patient closer to home and with continued scans at [facility]. Dr. Lisbeth Renshaw and I would be happy to be involved with his care, but to avoid duplicate efforts, we will see him as needed per Dr. Bobby Rumpf. He does have some mild changes in the RLL which could possibly need treating if they progress in size. The patient is aware of the risk of additional early stage disease presenting given his prior history and he is in agreement with this plan.    Carola Rhine, PAC

## 2020-11-07 ENCOUNTER — Ambulatory Visit (HOSPITAL_COMMUNITY)
Admission: RE | Admit: 2020-11-07 | Discharge: 2020-11-07 | Disposition: A | Discharge status: Home / Self Care | Payer: Medicare Other | Attending: Surgery | Admitting: Surgery

## 2020-11-07 ENCOUNTER — Other Ambulatory Visit: Payer: Self-pay

## 2020-11-07 ENCOUNTER — Encounter (HOSPITAL_COMMUNITY)
Admission: RE | Disposition: A | Discharge status: Home / Self Care | Payer: Self-pay | Source: Home / Self Care | Attending: Surgery

## 2020-11-07 DIAGNOSIS — F17210 Nicotine dependence, cigarettes, uncomplicated: Secondary | ICD-10-CM | POA: Diagnosis not present

## 2020-11-07 DIAGNOSIS — I2510 Atherosclerotic heart disease of native coronary artery without angina pectoris: Secondary | ICD-10-CM | POA: Insufficient documentation

## 2020-11-07 DIAGNOSIS — I10 Essential (primary) hypertension: Secondary | ICD-10-CM | POA: Insufficient documentation

## 2020-11-07 DIAGNOSIS — I70213 Atherosclerosis of native arteries of extremities with intermittent claudication, bilateral legs: Secondary | ICD-10-CM | POA: Diagnosis present

## 2020-11-07 DIAGNOSIS — M278 Other specified diseases of jaws: Secondary | ICD-10-CM | POA: Insufficient documentation

## 2020-11-07 DIAGNOSIS — J309 Allergic rhinitis, unspecified: Secondary | ICD-10-CM | POA: Insufficient documentation

## 2020-11-07 DIAGNOSIS — Z809 Family history of malignant neoplasm, unspecified: Secondary | ICD-10-CM | POA: Insufficient documentation

## 2020-11-07 DIAGNOSIS — Z800 Family history of malignant neoplasm of digestive organs: Secondary | ICD-10-CM | POA: Insufficient documentation

## 2020-11-07 DIAGNOSIS — E785 Hyperlipidemia, unspecified: Secondary | ICD-10-CM | POA: Insufficient documentation

## 2020-11-07 DIAGNOSIS — Z79899 Other long term (current) drug therapy: Secondary | ICD-10-CM | POA: Insufficient documentation

## 2020-11-07 DIAGNOSIS — F1010 Alcohol abuse, uncomplicated: Secondary | ICD-10-CM | POA: Insufficient documentation

## 2020-11-07 DIAGNOSIS — Z8719 Personal history of other diseases of the digestive system: Secondary | ICD-10-CM | POA: Insufficient documentation

## 2020-11-07 DIAGNOSIS — N529 Male erectile dysfunction, unspecified: Secondary | ICD-10-CM | POA: Insufficient documentation

## 2020-11-07 DIAGNOSIS — Z885 Allergy status to narcotic agent status: Secondary | ICD-10-CM | POA: Diagnosis not present

## 2020-11-07 DIAGNOSIS — J449 Chronic obstructive pulmonary disease, unspecified: Secondary | ICD-10-CM | POA: Insufficient documentation

## 2020-11-07 DIAGNOSIS — M5090 Cervical disc disorder, unspecified, unspecified cervical region: Secondary | ICD-10-CM | POA: Insufficient documentation

## 2020-11-07 DIAGNOSIS — M5412 Radiculopathy, cervical region: Secondary | ICD-10-CM | POA: Insufficient documentation

## 2020-11-07 DIAGNOSIS — I252 Old myocardial infarction: Secondary | ICD-10-CM | POA: Diagnosis not present

## 2020-11-07 DIAGNOSIS — Z86010 Personal history of colonic polyps: Secondary | ICD-10-CM | POA: Diagnosis not present

## 2020-11-07 DIAGNOSIS — E7800 Pure hypercholesterolemia, unspecified: Secondary | ICD-10-CM | POA: Insufficient documentation

## 2020-11-07 DIAGNOSIS — H409 Unspecified glaucoma: Secondary | ICD-10-CM | POA: Insufficient documentation

## 2020-11-07 DIAGNOSIS — I701 Atherosclerosis of renal artery: Secondary | ICD-10-CM | POA: Diagnosis not present

## 2020-11-07 DIAGNOSIS — M109 Gout, unspecified: Secondary | ICD-10-CM | POA: Insufficient documentation

## 2020-11-07 DIAGNOSIS — Z85828 Personal history of other malignant neoplasm of skin: Secondary | ICD-10-CM | POA: Insufficient documentation

## 2020-11-07 DIAGNOSIS — G5792 Unspecified mononeuropathy of left lower limb: Secondary | ICD-10-CM | POA: Insufficient documentation

## 2020-11-07 DIAGNOSIS — Z803 Family history of malignant neoplasm of breast: Secondary | ICD-10-CM | POA: Diagnosis not present

## 2020-11-07 DIAGNOSIS — Z888 Allergy status to other drugs, medicaments and biological substances status: Secondary | ICD-10-CM | POA: Diagnosis not present

## 2020-11-07 DIAGNOSIS — R251 Tremor, unspecified: Secondary | ICD-10-CM | POA: Insufficient documentation

## 2020-11-07 DIAGNOSIS — D519 Vitamin B12 deficiency anemia, unspecified: Secondary | ICD-10-CM | POA: Insufficient documentation

## 2020-11-07 DIAGNOSIS — M47812 Spondylosis without myelopathy or radiculopathy, cervical region: Secondary | ICD-10-CM | POA: Insufficient documentation

## 2020-11-07 DIAGNOSIS — K5731 Diverticulosis of large intestine without perforation or abscess with bleeding: Secondary | ICD-10-CM | POA: Insufficient documentation

## 2020-11-07 DIAGNOSIS — Z8249 Family history of ischemic heart disease and other diseases of the circulatory system: Secondary | ICD-10-CM | POA: Insufficient documentation

## 2020-11-07 DIAGNOSIS — D500 Iron deficiency anemia secondary to blood loss (chronic): Secondary | ICD-10-CM | POA: Insufficient documentation

## 2020-11-07 DIAGNOSIS — G629 Polyneuropathy, unspecified: Secondary | ICD-10-CM | POA: Diagnosis not present

## 2020-11-07 DIAGNOSIS — Z720 Tobacco use: Secondary | ICD-10-CM | POA: Insufficient documentation

## 2020-11-07 DIAGNOSIS — K589 Irritable bowel syndrome without diarrhea: Secondary | ICD-10-CM | POA: Insufficient documentation

## 2020-11-07 DIAGNOSIS — Z8619 Personal history of other infectious and parasitic diseases: Secondary | ICD-10-CM | POA: Insufficient documentation

## 2020-11-07 DIAGNOSIS — K5790 Diverticulosis of intestine, part unspecified, without perforation or abscess without bleeding: Secondary | ICD-10-CM | POA: Insufficient documentation

## 2020-11-07 HISTORY — PX: ABDOMINAL AORTOGRAM W/LOWER EXTREMITY: CATH118223

## 2020-11-07 LAB — POCT I-STAT, CHEM 8
BUN: 10 mg/dL (ref 8–23)
Calcium, Ion: 1.46 mmol/L — ABNORMAL HIGH (ref 1.15–1.40)
Chloride: 98 mmol/L (ref 98–111)
Creatinine, Ser: 1 mg/dL (ref 0.61–1.24)
Glucose, Bld: 87 mg/dL (ref 70–99)
HCT: 37 % — ABNORMAL LOW (ref 39.0–52.0)
Hemoglobin: 12.6 g/dL — ABNORMAL LOW (ref 13.0–17.0)
Potassium: 3.8 mmol/L (ref 3.5–5.1)
Sodium: 133 mmol/L — ABNORMAL LOW (ref 135–145)
TCO2: 25 mmol/L (ref 22–32)

## 2020-11-07 SURGERY — ABDOMINAL AORTOGRAM W/LOWER EXTREMITY
Anesthesia: LOCAL

## 2020-11-07 MED ORDER — FENTANYL CITRATE (PF) 100 MCG/2ML IJ SOLN
INTRAMUSCULAR | Status: AC
  Filled 2020-11-07: qty 2

## 2020-11-07 MED ORDER — ACETAMINOPHEN 325 MG PO TABS: 650.0000 mg | ORAL_TABLET | ORAL | Status: DC | PRN

## 2020-11-07 MED ORDER — ASPIRIN EC 81 MG PO TBEC: 81.0000 mg | DELAYED_RELEASE_TABLET | Freq: Every day | ORAL | Status: DC

## 2020-11-07 MED ORDER — MORPHINE SULFATE (PF) 2 MG/ML IV SOLN: 2.0000 mg | INTRAVENOUS | Status: DC | PRN

## 2020-11-07 MED ORDER — DIPHENHYDRAMINE HCL 50 MG/ML IJ SOLN
25.0000 mg | Freq: Once | INTRAMUSCULAR | Status: AC
  Administered 2020-11-07: 25 mg via INTRAVENOUS

## 2020-11-07 MED ORDER — SODIUM CHLORIDE 0.9 % WEIGHT BASED INFUSION
1.0000 mL/kg/h | INTRAVENOUS | Status: DC
  Administered 2020-11-07: 1 mL/kg/h via INTRAVENOUS

## 2020-11-07 MED ORDER — IODIXANOL 320 MG/ML IV SOLN
INTRAVENOUS | Status: DC | PRN
  Administered 2020-11-07: 85 mL via INTRA_ARTERIAL

## 2020-11-07 MED ORDER — FENTANYL CITRATE (PF) 100 MCG/2ML IJ SOLN
INTRAMUSCULAR | Status: DC | PRN
  Administered 2020-11-07: 50 ug via INTRAVENOUS

## 2020-11-07 MED ORDER — SODIUM CHLORIDE 0.9% FLUSH: 3.0000 mL | Freq: Two times a day (BID) | INTRAVENOUS | Status: DC

## 2020-11-07 MED ORDER — SODIUM CHLORIDE 0.9 % IV SOLN: INTRAVENOUS | Status: DC

## 2020-11-07 MED ORDER — HYDRALAZINE HCL 20 MG/ML IJ SOLN
5.0000 mg | INTRAMUSCULAR | Status: AC | PRN
  Administered 2020-11-07 (×2): 5 mg via INTRAVENOUS

## 2020-11-07 MED ORDER — LIDOCAINE HCL (PF) 1 % IJ SOLN
INTRAMUSCULAR | Status: AC
  Filled 2020-11-07: qty 30

## 2020-11-07 MED ORDER — HYDRALAZINE HCL 20 MG/ML IJ SOLN
INTRAMUSCULAR | Status: AC
  Filled 2020-11-07: qty 1

## 2020-11-07 MED ORDER — SODIUM CHLORIDE 0.9% FLUSH: 3.0000 mL | INTRAVENOUS | Status: DC | PRN

## 2020-11-07 MED ORDER — LABETALOL HCL 5 MG/ML IV SOLN
10.0000 mg | INTRAVENOUS | Status: DC | PRN
Start: 2020-11-07 — End: 2020-11-07

## 2020-11-07 MED ORDER — HEPARIN (PORCINE) IN NACL 1000-0.9 UT/500ML-% IV SOLN
INTRAVENOUS | Status: DC | PRN
  Administered 2020-11-07 (×2): 500 mL

## 2020-11-07 MED ORDER — HEPARIN (PORCINE) IN NACL 1000-0.9 UT/500ML-% IV SOLN
INTRAVENOUS | Status: AC
  Filled 2020-11-07: qty 1000

## 2020-11-07 MED ORDER — MIDAZOLAM HCL 2 MG/2ML IJ SOLN
INTRAMUSCULAR | Status: DC | PRN
  Administered 2020-11-07: 2 mg via INTRAVENOUS

## 2020-11-07 MED ORDER — SODIUM CHLORIDE 0.9 % IV SOLN: 250.0000 mL | INTRAVENOUS | Status: DC | PRN

## 2020-11-07 MED ORDER — MIDAZOLAM HCL 2 MG/2ML IJ SOLN
INTRAMUSCULAR | Status: AC
  Filled 2020-11-07: qty 2

## 2020-11-07 MED ORDER — LIDOCAINE HCL (PF) 1 % IJ SOLN
INTRAMUSCULAR | Status: DC | PRN
  Administered 2020-11-07: 15 mL via INTRADERMAL

## 2020-11-07 MED ORDER — DIPHENHYDRAMINE HCL 50 MG/ML IJ SOLN
INTRAMUSCULAR | Status: AC
  Filled 2020-11-07: qty 1

## 2020-11-07 MED ORDER — ONDANSETRON HCL 4 MG/2ML IJ SOLN: 4.0000 mg | Freq: Four times a day (QID) | INTRAMUSCULAR | Status: DC | PRN

## 2020-11-07 SURGICAL SUPPLY — 11 items
CATH ANGIO 5F BER2 65CM (CATHETERS) ×1 IMPLANT
CATH OMNI FLUSH 5F 65CM (CATHETERS) ×1 IMPLANT
KIT MICROPUNCTURE NIT STIFF (SHEATH) ×1 IMPLANT
KIT PV (KITS) ×2 IMPLANT
SHEATH PINNACLE 5F 10CM (SHEATH) ×1 IMPLANT
SHEATH PROBE COVER 6X72 (BAG) ×1 IMPLANT
SYR MEDRAD MARK V 150ML (SYRINGE) ×1 IMPLANT
TRANSDUCER W/STOPCOCK (MISCELLANEOUS) ×2 IMPLANT
TRAY PV CATH (CUSTOM PROCEDURE TRAY) ×2 IMPLANT
WIRE ROSEN-J .035X180CM (WIRE) ×1 IMPLANT
WIRE STARTER BENTSON 035X150 (WIRE) ×1 IMPLANT

## 2020-11-07 NOTE — Interval H&P Note (Signed)
History and Physical Interval Note:  11/07/2020 7:31 AM  Brian Yates  has presented today for surgery, with the diagnosis of pad.  The various methods of treatment have been discussed with the patient and family. After consideration of risks, benefits and other options for treatment, the patient has consented to  Procedure(s): ABDOMINAL AORTOGRAM W/LOWER EXTREMITY (N/A) as a surgical intervention.  The patient's history has been reviewed, patient examined, no change in status, stable for surgery.  I have reviewed the patient's chart and labs.  Questions were answered to the patient's satisfaction.     Annamarie Major

## 2020-11-07 NOTE — Progress Notes (Signed)
SITE AREA: left femoral/groin  SITE PRIOR TO REMOVAL:  LEVEL 0  PRESSURE APPLIED FOR: approximately 20 minutes by Caryl Pina, RN ([place])  MANUAL: yes  PATIENT STATUS DURING PULL: wnl, pt states he has restless leg syndrome and eczema, hard to remain flat and still  POST PULL SITE:  LEVEL 0  POST PULL INSTRUCTIONS GIVEN: yes  POST PULL PULSES PRESENT: right pedal dopplerable, left femoral palpable   DRESSING APPLIED: gauze with tegaderm  BEDREST BEGINS @ 0921   COMMENTS:

## 2020-11-07 NOTE — Op Note (Signed)
Patient name: Brian Yates MRN: [id number] DOB: June 14, 1941 Sex: male  11/07/2020 Pre-operative Diagnosis: Bilateral severe claudication Post-operative diagnosis:  Same Surgeon:  Annamarie Major Procedure Performed:  1.  Ultrasound-guided access, left femoral artery  2.  Abdominal aortogram  3.  Bilateral runoff  4.  Conscious sedation, 33 minutes     Indications: This is a 80 year old gentleman with progressive bilateral lower extremity claudication.  He comes in today for further evaluation and possible intervention  Procedure:  The patient was identified in the holding area and taken to room 8.  The patient was then placed supine on the table and prepped and draped in the usual sterile fashion.  A time out was called.  Conscious sedation was administered with the use of IV fentanyl and Versed under continuous physician and nurse monitoring.  Heart rate, blood pressure, and oxygen saturation were continuously monitored.  Total sedation time was 33 minutes.  Ultrasound was used to evaluate the left common femoral artery.  It was patent .  A digital ultrasound image was acquired.  A micropuncture needle was used to access the left common femoral artery under ultrasound guidance.  An 018 wire was advanced without resistance and a micropuncture sheath was placed.  The 018 wire was removed and a benson wire was placed.  The micropuncture sheath was exchanged for a 5 french sheath.  An omniflush catheter was advanced over the wire to the level of L-1.  An abdominal angiogram was obtained.  Next the catheter was pulled out of the aortic bifurcation and pelvic imaging was obtained in different obliquities followed by bilateral runoff down to the mid calf Findings:   Aortogram: No significant right renal artery stenosis.  There is a calcific plaque at the origin of the left renal artery with greater than 50% stenosis.  The infrarenal abdominal aorta is heavily calcified with a atherosclerotic ulcer  just below the renal arteries.  Severe stenosis is noted within the right external iliac artery with multiple areas of exophytic plaque with greater than 80% stenosis.  There is also an ostial lesion of the right common iliac artery.  The left common and external iliac artery are nearly occluded.  Right Lower Extremity: Severe right common femoral artery stenosis with heavy calcification.  The superficial femoral artery is occluded.  It does appear to reconstitute in the adductor canal but is heavily diseased down below the joint space.  The below-knee popliteal artery appears to be small in caliber but patent without stenosis.  Tibial vessels could not be evaluated given his inability to sit still and proximal disease.  Left Lower Extremity: Heavily calcified left common femoral artery which is nearly occluded.  The profundofemoral artery is patent with proximal stenosis.  It appears that the superficial femoral artery may be patent however it is heavily calcified and small in caliber.  It does appear to occlude in the adductor canal and reconstitute a below-knee popliteal artery which appears to be small in caliber but disease-free.  There is limited evaluation of the tibial vessels secondary to proximal disease and patient movement.  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  #2  Severe bilateral common femoral artery stenosis, with bilateral superficial femoral artery occlusion and reconstitution of the below-knee popliteal artery.  Tibial vessels were difficult to evaluate secondary to patient movement and proximal disease  #  3  The patient will need to be considered for an aortobifemoral bypass graft.  He will need a CT scan to better evaluate his atherosclerotic aortic ulcer prior to considering surgery, in addition he will need full cardiac  clearance.   Theotis Burrow, M.D., [facility] [place] Office: [phone] Pager:  [phone]

## 2020-11-07 NOTE — Discharge Instructions (Signed)
Femoral Site Care  This sheet gives you information about how to care for yourself after your procedure. Your health care provider may also give you more specific instructions. If you have problems or questions, contact your health care provider. What can I expect after the procedure? After the procedure, it is common to have:  Bruising that usually fades within 1-2 weeks.  Tenderness at the site. Follow these instructions at home: Wound care  Follow instructions from your health care provider about how to take care of your insertion site. Make sure you: ? Wash your hands with soap and water before you change your bandage (dressing). If soap and water are not available, use hand sanitizer. ? Change your dressing as told by your health care provider.  Do not take baths, swim, or use a hot tub until your health care provider approves.  You may shower 24-48 hours after the procedure or as told by your health care provider. ? Gently wash the site with plain soap and water. ? Pat the area dry with a clean towel. ? Do not rub the site. This may cause bleeding.  Do not apply powder or lotion to the site. Keep the site clean and dry.  Check your femoral site every day for signs of infection. Check for: ? Redness, swelling, or pain. ? Fluid or blood. ? Warmth. ? Pus or a bad smell. Activity  For the first 2-3 days after your procedure, or as long as directed: ? Avoid climbing stairs as much as possible. ? Do not squat.  Do not lift anything that is heavier than 10 lb (4.5 kg), or the limit that you are told, until your health care provider says that it is safe.  Rest as directed. ? Avoid sitting for a long time without moving. Get up to take short walks every 1-2 hours.  Do not drive for 24 hours if you were given a medicine to help you relax (sedative). General instructions  Take over-the-counter and prescription medicines only as told by your health care provider.  Keep all  follow-up visits as told by your health care provider. This is important. Contact a health care provider if you have:  A fever or chills.  You have redness, swelling, or pain around your insertion site. Get help right away if:  The catheter insertion area swells very fast.  You pass out.  You suddenly start to sweat or your skin gets clammy.  The catheter insertion area is bleeding, and the bleeding does not stop when you hold steady pressure on the area.  The area near or just beyond the catheter insertion site becomes pale, cool, tingly, or numb. These symptoms may represent a serious problem that is an emergency. Do not wait to see if the symptoms will go away. Get medical help right away. Call your local emergency services (911 in the [place] Do not drive yourself to the hospital. Summary  After the procedure, it is common to have bruising that usually fades within 1-2 weeks.  Check your femoral site every day for signs of infection.  Do not lift anything that is heavier than 10 lb (4.5 kg), or the limit that you are told, until your health care provider says that it is safe. This information is not intended to replace advice given to you by your health care provider. Make sure you discuss any questions you have with your health care provider. Document Revised: 02/18/2020 Document Reviewed: 02/18/2020 Elsevier Patient Education  2021 Elsevier  Inc.    Moderate Conscious Sedation, Adult, Care After This sheet gives you information about how to care for yourself after your procedure. Your health care provider may also give you more specific instructions. If you have problems or questions, contact your health care provider. What can I expect after the procedure? After the procedure, it is common to have:  Sleepiness for several hours.  Impaired judgment for several hours.  Difficulty with balance.  Vomiting if you eat too soon. Follow these instructions at home: For the  time period you were told by your health care provider:  Rest.  Do not participate in activities where you could fall or become injured.  Do not drive or use machinery.  Do not drink alcohol.  Do not take sleeping pills or medicines that cause drowsiness.  Do not make important decisions or sign legal documents.  Do not take care of children on your own.      Eating and drinking  Follow the diet recommended by your health care provider.  Drink enough fluid to keep your urine pale yellow.  If you vomit: ? Drink water, juice, or soup when you can drink without vomiting. ? Make sure you have little or no nausea before eating solid foods.   General instructions  Take over-the-counter and prescription medicines only as told by your health care provider.  Have a responsible adult stay with you for the time you are told. It is important to have someone help care for you until you are awake and alert.  Do not smoke.  Keep all follow-up visits as told by your health care provider. This is important. Contact a health care provider if:  You are still sleepy or having trouble with balance after 24 hours.  You feel light-headed.  You keep feeling nauseous or you keep vomiting.  You develop a rash.  You have a fever.  You have redness or swelling around the IV site. Get help right away if:  You have trouble breathing.  You have new-onset confusion at home. Summary  After the procedure, it is common to feel sleepy, have impaired judgment, or feel nauseous if you eat too soon.  Rest after you get home. Know the things you should not do after the procedure.  Follow the diet recommended by your health care provider and drink enough fluid to keep your urine pale yellow.  Get help right away if you have trouble breathing or new-onset confusion at home. This information is not intended to replace advice given to you by your health care provider. Make sure you discuss any  questions you have with your health care provider. Document Revised: 10/15/2019 Document Reviewed: 05/13/2019 Elsevier Patient Education  2021 [employer].

## 2020-11-08 ENCOUNTER — Encounter (HOSPITAL_COMMUNITY): Payer: Self-pay | Admitting: Surgery

## 2020-11-09 NOTE — Progress Notes (Addendum)
Cardiology Office Note:    Date:  11/10/2020   ID:  Brian Yates, DOB 09/23/1940, MRN [id number]  PCP:  Brian Dress, MD  Cardiologist:  Brian More, MD   Referring MD: Brian Dress, MD  ASSESSMENT:    1. Preoperative cardiovascular examination   2. Nonrheumatic aortic valve stenosis   3. Right bundle branch block (RBBB) with left anterior hemiblock   4. Essential hypertension   5. Mixed hyperlipidemia   6. Coronary artery calcification seen on CAT scan    PLAN:    In order of problems listed above:  11/21/2020: His Myoview study shows normal ejection fraction no ischemia, await results of echocardiogram and if aortic stenosis has not progressed and is severe or critical proceed with his planned surgery.  1. Because of the presence of underlying conduction system disease bifascicular heart block aortic stenosis and diffuse atherosclerosis in this case with high risk surgery I think he benefit from preoperative evaluation including echocardiogram to exclude progression to significant aortic stenosis and a myocardial perfusion study.  My expectation is he will then be optimized for his planned surgical procedure.  Postoperatively should be on monitored bed while in hospital EKG postoperative day 1 any cardiac issues please call heart care to be involved in his hospital stay and continue his usual cardiac medications for hyperlipidemia and hypertension. 2. Recheck echocardiogram clinically does not appear to have progressed 3. Stable EKG pattern asymptomatic he has been previously evaluated by EP 4. BP at target continue current treatment 5. Continue lipid-lowering therapy he is on high intensity statin 6. Check myocardial perfusion study unless he has high risk markers I would not pursue coronary angiography and revascularization prior to vascular surgery  Next appointment routinely 6 months   Medication Adjustments/Labs and Tests Ordered: Current medicines are  reviewed at length with the patient today.  Concerns regarding medicines are outlined above.  Orders Placed This Encounter  Procedures  . MYOCARDIAL PERFUSION IMAGING  . EKG 12-Lead  . ECHOCARDIOGRAM COMPLETE   No orders of the defined types were placed in this encounter.    No chief complaint on file.   History of Present Illness:    Brian Yates is a 80 y.o. male with a history of bifascicular block hypertension hyperlipidemia peripheral arterial disease aortic stenosis and coronary artery disease who is being seen today for preoperative cardiovascular evaluation prior to vascular surgery left femoral bypass graft at the request of Brian Dress, MD.  Many years ago he is seen by cardiology and on the basis of EKG and stress test was told he had a previous myocardial infarction but has never had coronary angiography.  He was seen by EP in July 2021 for asymptomatic intermittent junctional rhythm and was not advised of permanent pacemaker.  Echocardiogram performed June 2021 showed EF 55 to 60% with mild concentric LVH severe left and right atrial enlargement mild to moderate aortic stenosis.  His wife is present and participates in the evaluation and decision making as he has difficulty with memory. He is pending extensive vascular surgery. Despite the severity of his PAD he can still walk the length of the hallway without stopping but is very bothered by calf claudication bilaterally He is not having palpitation syncope shortness of breath chest pain or TIA.  Because of the high risk nature of his surgery and underlying heart disease have asked him to recheck an echocardiogram to be sure he has not developed severe aortic stenosis not suggestion  of physical exam and repeat an ischemia evaluation with a pharmacologic perfusion study.  Unless he had high risk markers I would proceed with his vascular surgery. Past Medical History:  Diagnosis Date  . Alcohol abuse   . Anemia  due to gastrointestinal blood loss   . AR (allergic rhinitis)   . 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 Cervical disc disease   . Cervical radiculopathy   . COLD (chronic obstructive lung disease) ([place])   . Controlled gout   . Coronary atherosclerosis of native coronary artery   . Diverticulosis   . Diverticulosis of colon with hemorrhage   . Erectile dysfunction   . FH: colon cancer   . Glaucoma   . History of hepatitis B   . History of skin cancer   . Hx of colonic polyps   . Hyperlipidemia   . Hypertension   . Irritable bowel syndrome   . Mass of jaw   . Neuropathy of left foot   . Previous myocardial infarction older than 8 weeks 1998  . Tobacco abuse   . Tremor   . Vitamin B12 deficiency anemia     Past Surgical History:  Procedure Laterality Date  . ABDOMINAL AORTOGRAM W/LOWER EXTREMITY N/A 11/07/2020   Procedure: ABDOMINAL AORTOGRAM W/LOWER EXTREMITY;  Surgeon: Brian Mitchell, MD;  Location: [facility];  Service: Cardiovascular;  Laterality: N/A;  . CATARACT EXTRACTION Bilateral   . HERNIA REPAIR Bilateral   . LARYNX SURGERY      Current Medications: Current Meds  Medication Sig  . albuterol (VENTOLIN HFA) 108 (90 Base) MCG/ACT inhaler Inhale 2 puffs into the lungs in the morning, at noon, in the evening, and at bedtime.  Marland Kitchen atorvastatin (LIPITOR) 40 MG tablet Take 20 mg by mouth every evening.   . brimonidine (ALPHAGAN P) 0.1 % SOLN Place 1 drop into both eyes in the morning and at bedtime.   . carbidopa-levodopa (SINEMET IR) 10-100 MG tablet Take 1 tablet by mouth 3 (three) times daily.  . Dupilumab (DUPIXENT) 300 MG/2ML SOPN Inject 300 mg into the skin every 14 (fourteen) days.  . fenofibrate micronized (LOFIBRA) 134 MG capsule Take 134 mg by mouth every evening.   . hydrOXYzine (ATARAX/VISTARIL) 50 MG tablet Take 100 mg by mouth at bedtime as needed (sleep).  . isosorbide mononitrate (IMDUR) 30 MG 24 hr tablet Take 30 mg by mouth in the  morning.  . latanoprost (XALATAN) 0.005 % ophthalmic solution Place 1 drop into both eyes at bedtime.  Brian Yates (MORINGA PO) Take 1,400 mg by mouth daily in the afternoon.  . nitroGLYCERIN (NITROSTAT) 0.4 MG SL tablet Place 1 tablet (0.4 mg total) under the tongue every 5 (five) minutes x 3 doses as needed for chest pain.  . tamsulosin (FLOMAX) 0.4 MG CAPS capsule Take 0.4 mg by mouth at bedtime.  . triamcinolone cream (KENALOG) 0.1 % Apply 1 application topically 4 (four) times daily as needed (itching.).     Allergies:   Bentyl [dicyclomine hcl], Codeine sulfate [codeine], Dupixent [dupilumab], Lisinopril, and Valsartan   Social History   Socioeconomic History  . Marital status: Married    Spouse name: Not on file  . Number of children: Not on file  . Years of education: Not on file  . Highest education level: Not on file  Occupational History  . Occupation: Retired  Tobacco Use  . Smoking status: Current Every Day Smoker    Packs/day: 1.50  .  Smokeless tobacco: Never Used  . Tobacco comment: Trying to quit  Vaping Use  . Vaping Use: Never used  Substance and Sexual Activity  . Alcohol use: No  . Drug use: No  . Sexual activity: Not on file  Other Topics Concern  . Not on file  Social History Narrative  . Not on file   Social Determinants of Health   Financial Resource Strain: Not on file  Food Insecurity: Not on file  Transportation Needs: Not on file  Physical Activity: Not on file  Stress: Not on file  Social Connections: Not on file     Family History: The patient's family history includes Alcohol abuse in his father; Breast cancer in his sister; CAD in his father; Cancer - Other in his mother; Colon cancer in his mother; Heart attack in his father, paternal grandfather, and paternal uncle; Heart disease in his father; Hyperlipidemia in his father and mother; Hypertension in his father and mother; Stroke in his maternal aunt.  ROS:   ROS Please see  the history of present illness.     All other systems reviewed and are negative.  EKGs/Labs/Other Studies Reviewed:    The following studies were reviewed today:   EKG:  EKG is  ordered today.  The ekg ordered today is personally reviewed and demonstrates sinus rhythm normal PR interval stable bifascicular heart block pattern  Recent Labs: 02/23/2020: Platelets 193 11/07/2020: BUN 10; Creatinine, Ser 1.00; Hemoglobin 12.6; Potassium 3.8; Sodium 133  Recent Lipid Panel No results found for: CHOL, TRIG, HDL, CHOLHDL, VLDL, LDLCALC, LDLDIRECT  Physical Exam:    VS:  BP 121/60   Pulse 92   Ht 5\' 8"  (1.727 m)   Wt 136 lb (61.7 kg)   SpO2 96%   BMI 20.68 kg/m     Wt Readings from Last 3 Encounters:  11/10/20 136 lb (61.7 kg)  11/07/20 142 lb (64.4 kg)  10/17/20 137 lb 14.4 oz (62.6 kg)     GEN: He looks frail well nourished, well developed in no acute distress HEENT: Normal NECK: No JVD; No carotid bruits LYMPHATICS: No lymphadenopathy CARDIAC: Unimpressive grade 1/6 to 2/6 aortic outflow murmur does not radiate to the carotids S2 is normal RRR, no aortic regurgitation, rubs, gallops RESPIRATORY:  Clear to auscultation without rales, wheezing or rhonchi  ABDOMEN: Soft, non-tender, non-distended MUSCULOSKELETAL:  No edema; No deformity  SKIN: Warm and dry NEUROLOGIC:  Alert and oriented x 3 PSYCHIATRIC:  Normal affect     Signed, Brian More, MD  11/10/2020 10:15 AM    [place]

## 2020-11-10 ENCOUNTER — Ambulatory Visit (INDEPENDENT_AMBULATORY_CARE_PROVIDER_SITE_OTHER): Payer: Medicare Other | Admitting: Cardiology

## 2020-11-10 ENCOUNTER — Other Ambulatory Visit: Payer: Self-pay

## 2020-11-10 ENCOUNTER — Other Ambulatory Visit: Payer: Self-pay | Admitting: *Deleted

## 2020-11-10 ENCOUNTER — Encounter: Payer: Self-pay | Admitting: Cardiology

## 2020-11-10 VITALS — BP 121/60 | HR 92 | Ht 68.0 in | Wt 136.0 lb

## 2020-11-10 DIAGNOSIS — I6529 Occlusion and stenosis of unspecified carotid artery: Secondary | ICD-10-CM

## 2020-11-10 DIAGNOSIS — I10 Essential (primary) hypertension: Secondary | ICD-10-CM

## 2020-11-10 DIAGNOSIS — Z01810 Encounter for preprocedural cardiovascular examination: Secondary | ICD-10-CM

## 2020-11-10 DIAGNOSIS — E782 Mixed hyperlipidemia: Secondary | ICD-10-CM

## 2020-11-10 DIAGNOSIS — I350 Nonrheumatic aortic (valve) stenosis: Secondary | ICD-10-CM | POA: Diagnosis not present

## 2020-11-10 DIAGNOSIS — I2510 Atherosclerotic heart disease of native coronary artery without angina pectoris: Secondary | ICD-10-CM

## 2020-11-10 DIAGNOSIS — I452 Bifascicular block: Secondary | ICD-10-CM | POA: Diagnosis not present

## 2020-11-10 NOTE — Patient Instructions (Signed)
Medication Instructions:  Your physician recommends that you continue on your current medications as directed. Please refer to the Current Medication list given to you today.  *If you need a refill on your cardiac medications before your next appointment, please call your pharmacy*   Lab Work: None If you have labs (blood work) drawn today and your tests are completely normal, you will receive your results only by: Marland Kitchen MyChart Message (if you have MyChart) OR . A paper copy in the mail If you have any lab test that is abnormal or we need to change your treatment, we will call you to review the results.   Testing/Procedures: Your physician has requested that you have an echocardiogram. Echocardiography is a painless test that uses sound waves to create images of your heart. It provides your doctor with information about the size and shape of your heart and how well your heart's chambers and valves are working. This procedure takes approximately one hour. There are no restrictions for this procedure.    [facility] at [facility] [address], [place], [place] [address] Phone: [phone]    Please arrive 15 minutes prior to your appointment time for registration and insurance purposes.  The test will take approximately 3 to 4 hours to complete; you may bring reading material.  If someone comes with you to your appointment, they will need to remain in the main lobby due to limited space in the testing area. **If you are pregnant or breastfeeding, please notify the nuclear lab prior to your appointment**  How to prepare for your Myocardial Perfusion Test: . Do not eat or drink 3 hours prior to your test, except you may have water. . Do not consume products containing caffeine (regular or decaffeinated) 12 hours prior to your test. (ex: coffee, chocolate, sodas, tea). . Do bring a list of your current medications with you.  If not listed below, you  may take your medications as normal. . Do wear comfortable clothes (no dresses or overalls) and walking shoes, tennis shoes preferred (No heels or open toe shoes are allowed). . Do NOT wear cologne, perfume, aftershave, or lotions (deodorant is allowed). . If these instructions are not followed, your test will have to be rescheduled.  Please report to [address] for your test.  If you have questions or concerns about your appointment, you can call the Nuclear Lab at [phone].  If you cannot keep your appointment, please provide 24 hours notification to the Nuclear Lab, to avoid a possible $50 charge to your account.    Follow-Up: At [facility], you and your health needs are our priority.  As part of our continuing mission to provide you with exceptional heart care, we have created designated Provider Care Teams.  These Care Teams include your primary Cardiologist (physician) and Advanced Practice Providers (APPs -  Physician Assistants and Nurse Practitioners) who all work together to provide you with the care you need, when you need it.  We recommend signing up for the patient portal called "MyChart".  Sign up information is provided on this After Visit Summary.  MyChart is used to connect with patients for Virtual Visits (Telemedicine).  Patients are able to view lab/test results, encounter notes, upcoming appointments, etc.  Non-urgent messages can be sent to your provider as well.   To learn more about what you can do with MyChart, go to [email or web address].    Your next appointment:   6  week(s)  The format for your next appointment:   In Person  Provider:   Shirlee More, MD   Other Instructions

## 2020-11-16 ENCOUNTER — Ambulatory Visit
Admission: RE | Admit: 2020-11-16 | Discharge: 2020-11-16 | Disposition: A | Discharge status: Home / Self Care | Payer: Medicare Other | Source: Ambulatory Visit | Attending: Surgery | Admitting: Surgery

## 2020-11-16 ENCOUNTER — Telehealth (HOSPITAL_COMMUNITY): Payer: Self-pay | Admitting: *Deleted

## 2020-11-16 DIAGNOSIS — I70213 Atherosclerosis of native arteries of extremities with intermittent claudication, bilateral legs: Secondary | ICD-10-CM

## 2020-11-16 IMAGING — CT CT ANGIO AOBIFEM WO/W CM
1 of 4 series · 12 of 36 positions shown, 15 images · IV contrast (APPLIED)
Comparison: [DATE] and previous

CLINICAL DATA: Peripheral arterial disease, prior
revascularization, left leg pain

EXAM:
CT ANGIOGRAPHY OF ABDOMINAL AORTA WITH ILIOFEMORAL RUNOFF
TECHNIQUE: Multidetector CT imaging of the abdomen, pelvis and lower
extremities was performed using the standard protocol during bolus
administration of intravenous contrast. Multiplanar CT image
reconstructions and MIPs were obtained to evaluate the vascular
anatomy.
CONTRAST:  125mL [GR] IOPAMIDOL ([GR]) INJECTION 76%

[Series 5: angioao-bifem thins · axial · 0.82mm/px · z∈[-544,+493]mm · 12 of 1646 slices shown, 15 images]
[im 110/1646  soft-tissue]
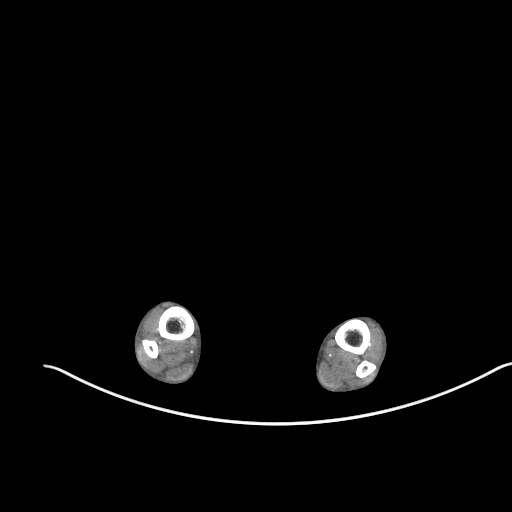
[im 110/1646  bone]
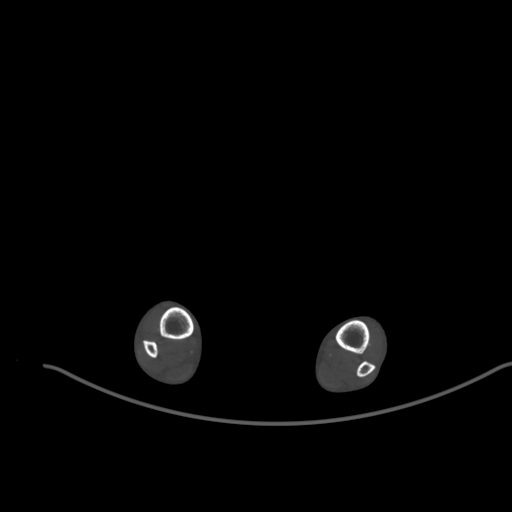
[im 275/1646  soft-tissue]
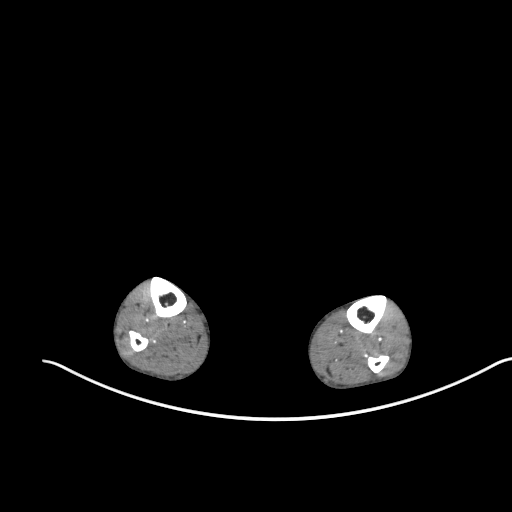
[im 494/1646  soft-tissue]
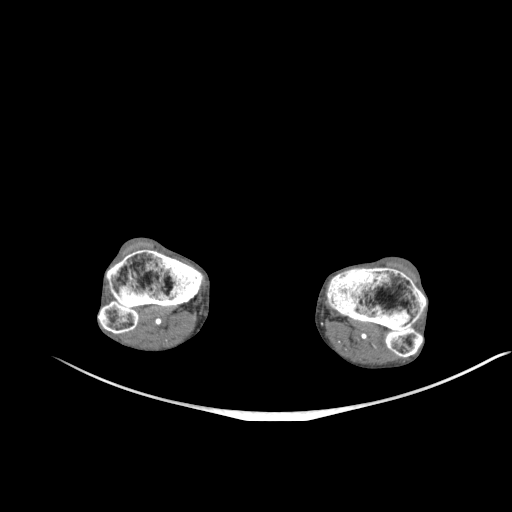
[im 659/1646  soft-tissue]
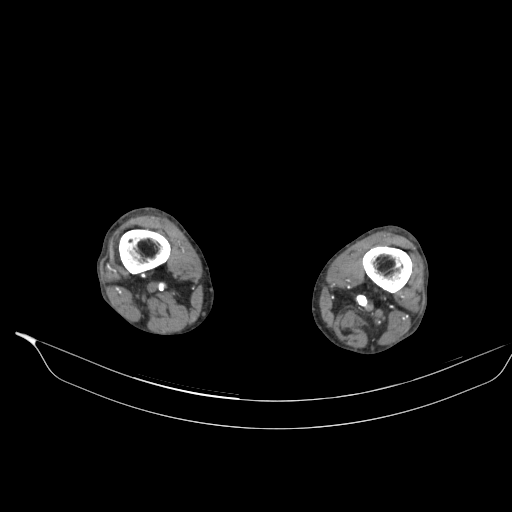
[im 823/1646  soft-tissue]
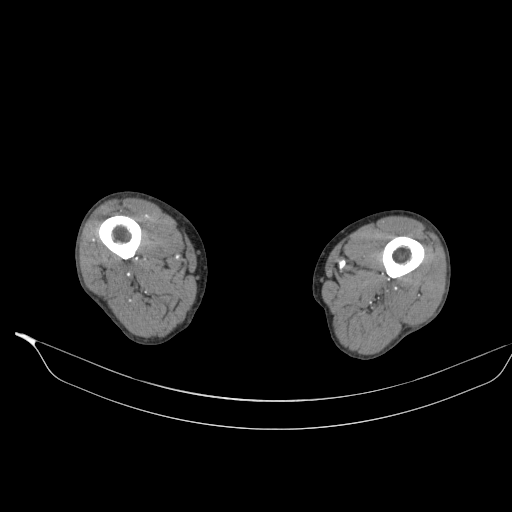
[im 988/1646  soft-tissue]
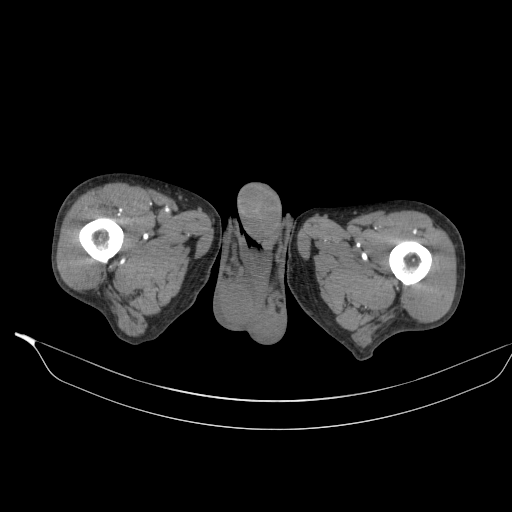
[im 1152/1646  soft-tissue]
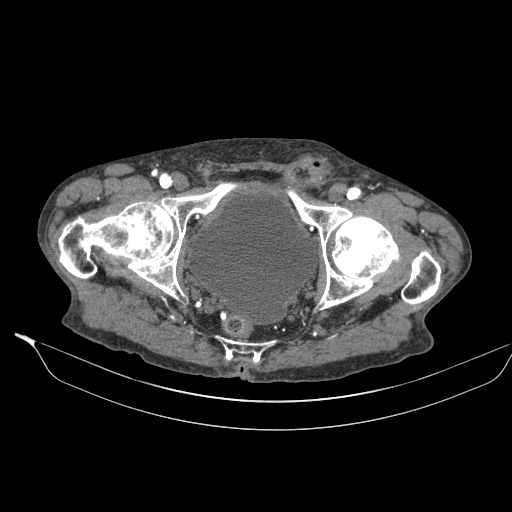
[im 1371/1646  soft-tissue]
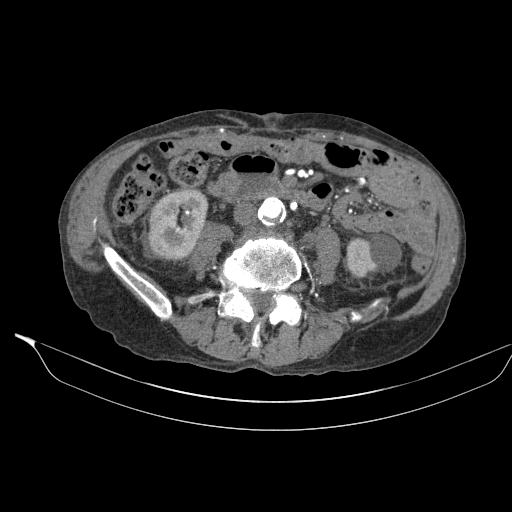
[im 1426/1646  lung]
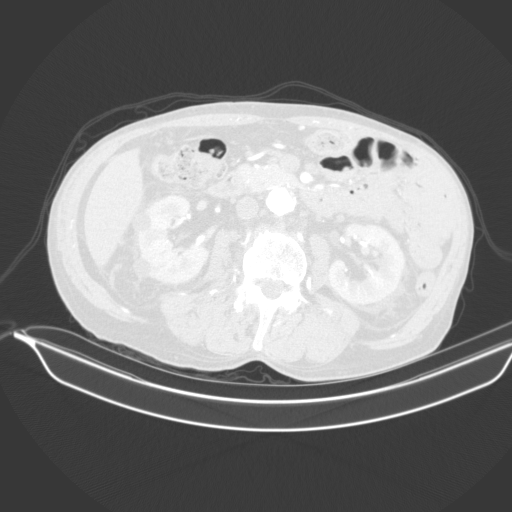
[im 1481/1646  lung]
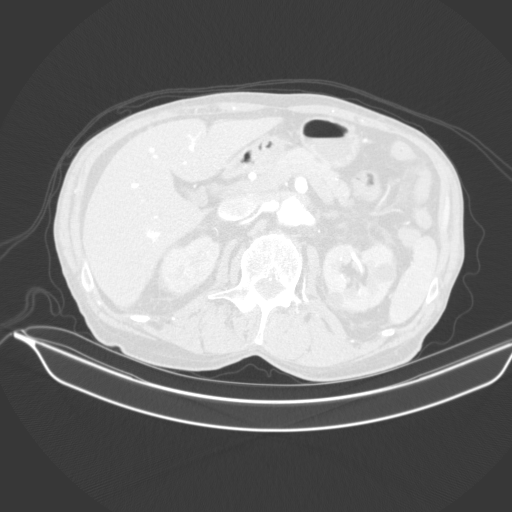
[im 1536/1646  soft-tissue]
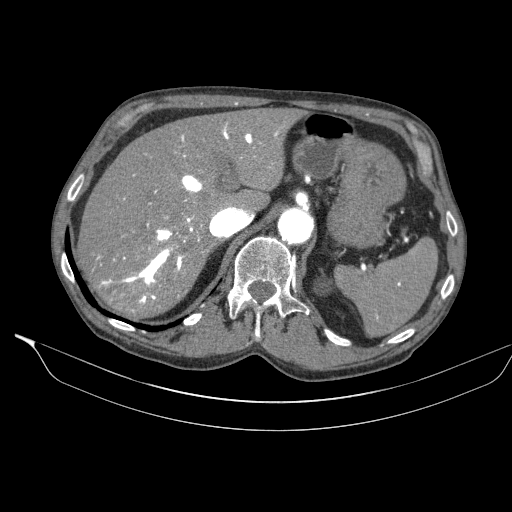
[im 1536/1646  lung]
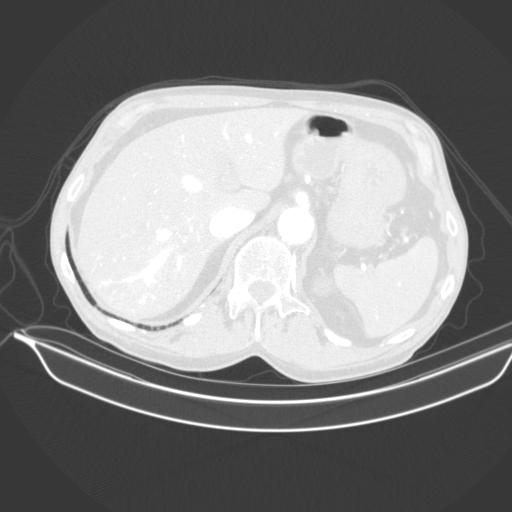
[im 1536/1646  bone]
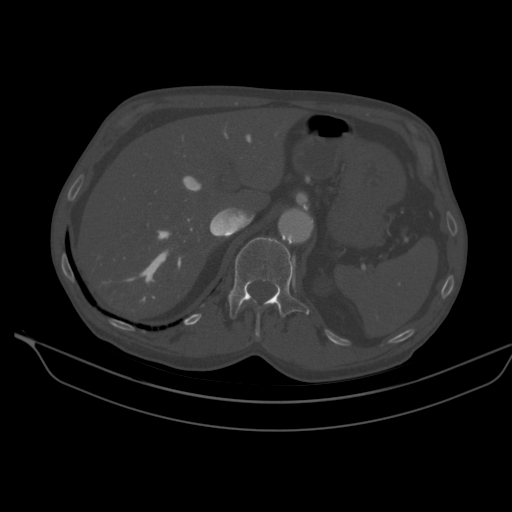
[im 1591/1646  lung]
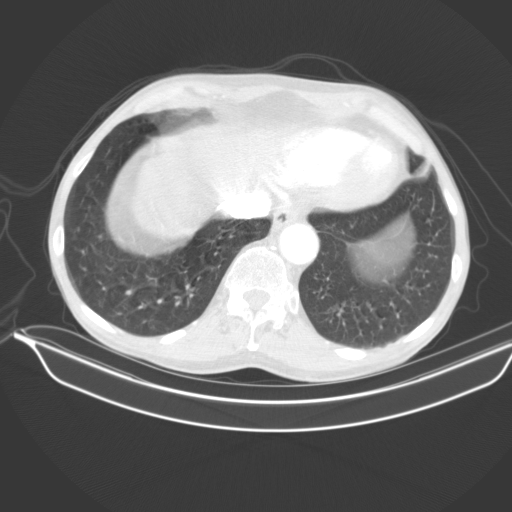

[12 of 36 positions shown; findings below may reference images not displayed]

FINDINGS: VASCULAR

Aorta: Moderate calcified atheromatous plaque throughout with
EDUIN type calcifications protruding into the lumen of the
aorta in the supra celiac segment, at the level of the SMA origin,
and in the infrarenal segment. No dissection, aneurysm, or
high-grade stenosis.

Celiac: Calcified ostial plaque resulting in short segment stenosis
of at least mild severity, patent distally

SMA: Heavily calcified ostial plaque extending over length of
cm, with short-segment proximal stenosis of at least moderate
severity, patent distally with classic branch anatomy.

Renals: Single left, with calcified ostial plaque over length
approximately 1.6 cm resulting in at least mild stenosis, patent
distally. Single right, with calcified ostial plaque but no
high-grade stenosis.

IMA: Origin stenosis related to aortic wall plaque, patent distally.

RIGHT Lower Extremity

Inflow: Common iliac heavily calcified eccentric ostial plaque with
short-segment ostial stenosis of at least mild severity, and tandem
stenosis at the bifurcation due to heavily calcified plaque
resulting in at least moderate short-segment stenosis.

Internal iliac origin occlusion, reconstituted distally primarily
via IMA collaterals.

External iliac origin stenosis related to the bifurcation calcified
plaque, with tandem calcified lesion in its midportion resulting in
at least moderate short-segment stenosis, and a tandem short-segment
lesion in its distal segment of at least moderate severity.

Outflow: Common femoral eccentric heavily calcified plaque
throughout with bifurcation stenosis of at least moderate severity.

Deep femoral branches patent.

SFA proximal occlusion, reconstituted mid thigh , diminutive and
atherosclerotic but patent distally.

Popliteal eccentric calcified ostial plaque resulting in tandem
areas of mild stenosis above the knee, patent distally.

Runoff: Contiguous three-vessel tibial runoff, mildly diseased
proximally.

LEFT Lower Extremity

Inflow: Common femoral heavily calcified eccentric plaque resulting
in tandem areas of at least mild stenosis.

Internal iliac heavily calcified proximal plaque with tandem areas
of at least moderate stenosis, but patent distally.

External iliac eccentric heavily calcified plaque with tandem areas
of at least mild short-segment stenosis in its proximal, mid, and
distal segments.

Outflow: Common femoral heavily calcified eccentric plaque resulting
in short segment stenosis bifurcation.

Deep femoral branches atheromatous proximally but patent.

SFA occlusion shortly beyond its origin, reconstituted mid thigh by
collaterals, with tandem high-grade stenosis or segmental in the
distal thigh and heavily calcified occlusive disease across the
adductor hiatus.

Popliteal heavily calcified occlusive plaque proximally with
collateral reconstitution just above the knee, patent distally

Runoff: Contiguous three-vessel tibial runoff, diseased proximally
with calcified plaque.

Veins: Contrast reflux from the right atrium into the hepatic veins
suggesting a degree of right heart dysfunction. No obvious venous
pathology within the limitations of this arterial phase study.

Review of the MIP images confirms the above findings.

NON-VASCULAR

Lower chest: No pleural or pericardial effusion.

Hepatobiliary: No focal liver abnormality is seen. No gallstones,
gallbladder wall thickening, or biliary dilatation.

Pancreas: Unremarkable. No pancreatic ductal dilatation or
surrounding inflammatory changes.

Spleen: Normal in size.  Stable calcified granuloma.

Adrenals/Urinary Tract: Adrenal glands normal. Multiple bilateral
renal cystic lesions largely stable. Mild edematous changes in the
perirenal fat bilaterally as before. No hydronephrosis or evident
urolithiasis. Urinary bladder is physiologically distended.

Stomach/Bowel: Stomach and small bowel are nondilated, unremarkable.
A loop of small bowel protrudes into a left inguinal hernia without
evidence of obstruction or strangulation. Normal appendix. Scattered
colonic diverticula without adjacent inflammatory changes.

Lymphatic: No abdominal or pelvic adenopathy.

Reproductive: Prostate enlargement with central coarse
calcifications.

Other: No ascites.  No free air.

Musculoskeletal: Mild lumbar levoscoliosis with multilevel
spondylitic change. Benign-appearing eccentric partially sclerotic
lesion in the proximal left tibial diaphysis. No fracture or
worrisome bone lesion.
IMPRESSION: 1. Heavily calcified abdominal aorta without aneurysm or high-grade
stenosis.
2. Ostial stenoses of at least mild hemodynamic significance
involving all 3 mesenteric arteries, but no bowel morphologic
changes to suggest mesenteric ischemia.
3. RIGHT common iliac artery bifurcation occlusive disease of
probable hemodynamic significance, with origin occlusion of the
internal iliac artery.
4. Tandem stenoses in the RIGHT external iliac artery of probable
hemodynamic significance.
5. RIGHT SFA proximal occlusion, reconstituted mid thigh with
diseased three-vessel runoff.
6. Multi segmental occlusive disease of the LEFT iliac system of
possible hemodynamic significance.
7. LEFT SFA origin occlusion, reconstituted mid thigh, with tandem
occlusive disease in the distal SFA and proximal popliteal.
Reconstituted distal popliteal with three-vessel tibial runoff.
8. Left inguinal hernia involving small bowel, without obstruction
or strangulation.
9. Colonic diverticulosis.

## 2020-11-16 MED ORDER — IOPAMIDOL (ISOVUE-370) INJECTION 76%
125.0000 mL | Freq: Once | INTRAVENOUS | Status: AC | PRN
  Administered 2020-11-16: 125 mL via INTRAVENOUS

## 2020-11-16 NOTE — Telephone Encounter (Signed)
Patient given detailed instructions per Myocardial Perfusion Study Information Sheet for the test on 11/21/20. Patient notified to arrive 15 minutes early and that it is imperative to arrive on time for appointment to keep from having the test rescheduled.  If you need to cancel or reschedule your appointment, please call the office within 24 hours of your appointment. . Patient verbalized understanding. Kirstie Peri

## 2020-11-20 ENCOUNTER — Other Ambulatory Visit: Payer: Self-pay

## 2020-11-20 ENCOUNTER — Ambulatory Visit (HOSPITAL_COMMUNITY)
Admission: RE | Admit: 2020-11-20 | Discharge: 2020-11-20 | Disposition: A | Discharge status: Home / Self Care | Payer: Medicare Other | Source: Ambulatory Visit | Attending: Surgery | Admitting: Surgery

## 2020-11-20 ENCOUNTER — Encounter: Payer: Self-pay | Admitting: Surgery

## 2020-11-20 ENCOUNTER — Ambulatory Visit (INDEPENDENT_AMBULATORY_CARE_PROVIDER_SITE_OTHER): Payer: Medicare Other | Admitting: Surgery

## 2020-11-20 VITALS — BP 157/71 | HR 59 | Temp 98.0°F | Resp 20 | Ht 68.0 in | Wt 142.8 lb

## 2020-11-20 DIAGNOSIS — I7025 Atherosclerosis of native arteries of other extremities with ulceration: Secondary | ICD-10-CM

## 2020-11-20 DIAGNOSIS — I70213 Atherosclerosis of native arteries of extremities with intermittent claudication, bilateral legs: Secondary | ICD-10-CM | POA: Diagnosis not present

## 2020-11-20 DIAGNOSIS — I6529 Occlusion and stenosis of unspecified carotid artery: Secondary | ICD-10-CM

## 2020-11-20 NOTE — Progress Notes (Signed)
[facility] [place]  Patient name: Brian Yates MRN: [id number] DOB: 1941-06-16 Sex: male   REASON FOR VISIT:    Follow up  [place] ILLNESS:   Brian Yates is a 80 y.o. male, who I last saw in 2019.  At that time, he had been having difficulty with his legs and walking for a long period of time however it had gotten progressively worse.  He was able to walk approximately 1/4 mile before he had to stop secondary to tightening in his calves, the right bothers him more than the left.  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  Medical management was recommended.  He was not given Pletal because of a history of a GI bleed.    He returned in April of 2022 with worsening symptoms.  He now gets calf cramping less than 100 feet.  Right leg may be slightly worse.  He does not have rest pain.  He does not have any open ulcerations. 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  I felt that surgical revascularization via an aortobifemoral bypass graft would be best to address his needs.  I sent him for cardiology clearance and he is back today for further discussions.  Since I last saw him he has had his nail clipped and had a skin penetration.  He now has erythema and worsening pain.  He has a history of coronary artery disease.  He has been told that he has had a heart attack in the past.  He is on a statin for hypercholesterolemia.  He is blood pressure is medically managed.  He has been treated for lung cancer   PAST MEDICAL HISTORY:   Past Medical History:  Diagnosis Date  . Alcohol abuse   . Anemia due to gastrointestinal blood loss   . AR (allergic rhinitis)   . 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 Cervical disc disease   . Cervical radiculopathy   . COLD (chronic obstructive lung disease) ([place])   . Controlled gout   . Coronary atherosclerosis of native coronary artery   . Diverticulosis   . Diverticulosis of colon with hemorrhage   . Erectile dysfunction   . FH: colon cancer   . Glaucoma   . History of hepatitis B   . History of skin cancer   . Hx of colonic polyps   . Hyperlipidemia   . Hypertension   . Irritable bowel syndrome   . Mass of jaw   . Neuropathy of left foot   . Previous myocardial infarction older than 8 weeks 1998  . Tobacco abuse   . Tremor   . Vitamin B12 deficiency anemia      FAMILY HISTORY:   Family History  Problem Relation Age of Onset  . Hypertension Mother   . Hyperlipidemia Mother   . Colon cancer Mother   . Cancer - Other Mother   . Hypertension Father   . Hyperlipidemia Father   . Heart disease Father   . CAD Father   . Heart attack Father   . Alcohol abuse Father   . Breast cancer Sister   . Stroke Maternal Aunt   . Heart attack Paternal Grandfather   . Heart attack Paternal Uncle     SOCIAL HISTORY:   Social History  Tobacco Use  . Smoking status: Current Every Day Smoker    Packs/day: 1.50  . Smokeless tobacco: Never Used  . Tobacco comment: Trying to quit  Substance Use Topics  . Alcohol use: No     ALLERGIES:   Allergies  Allergen Reactions  . Bentyl [Dicyclomine Hcl] Other (See Comments)    Hallucinations   . Codeine Sulfate [Codeine] Nausea And Vomiting  . Dupixent [Dupilumab]     Double vision  . Lisinopril Other (See Comments)    unknown  . Valsartan Other (See Comments)    unknown     CURRENT MEDICATIONS:   Current Outpatient Medications  Medication Sig Dispense Refill  . albuterol (VENTOLIN HFA) 108 (90 Base) MCG/ACT inhaler Inhale 2 puffs into the lungs in the morning, at noon, in the evening, and at bedtime.    Marland Kitchen atorvastatin (LIPITOR) 40 MG tablet Take 20 mg by mouth  every evening.     . brimonidine (ALPHAGAN P) 0.1 % SOLN Place 1 drop into both eyes in the morning and at bedtime.     . carbidopa-levodopa (SINEMET IR) 10-100 MG tablet Take 1 tablet by mouth 3 (three) times daily.    . Dupilumab (DUPIXENT) 300 MG/2ML SOPN Inject 300 mg into the skin every 14 (fourteen) days.    . fenofibrate micronized (LOFIBRA) 134 MG capsule Take 134 mg by mouth every evening.     . hydrOXYzine (ATARAX/VISTARIL) 50 MG tablet Take 100 mg by mouth at bedtime as needed (sleep).    . isosorbide mononitrate (IMDUR) 30 MG 24 hr tablet Take 30 mg by mouth in the morning.    . latanoprost (XALATAN) 0.005 % ophthalmic solution Place 1 drop into both eyes at bedtime.    Edyth Gunnels Oleifera (MORINGA PO) Take 1,400 mg by mouth daily in the afternoon.    . nitroGLYCERIN (NITROSTAT) 0.4 MG SL tablet Place 1 tablet (0.4 mg total) under the tongue every 5 (five) minutes x 3 doses as needed for chest pain. 25 tablet 3  . tamsulosin (FLOMAX) 0.4 MG CAPS capsule Take 0.4 mg by mouth at bedtime.    . triamcinolone cream (KENALOG) 0.1 % Apply 1 application topically 4 (four) times daily as needed (itching.).     No current facility-administered medications for this visit.    REVIEW OF SYSTEMS:   [X]  denotes positive finding, [ ]  denotes negative finding Cardiac  Comments:  Chest pain or chest pressure:    Shortness of breath upon exertion:    Short of breath when lying flat:    Irregular heart rhythm:        Vascular    Pain in calf, thigh, or hip brought on by ambulation: x   Pain in feet at night that wakes you up from your sleep:  x   Blood clot in your veins:    Leg swelling:         Pulmonary    Oxygen at home:    Productive cough:     Wheezing:         Neurologic    Sudden weakness in arms or legs:     Sudden numbness in arms or legs:     Sudden onset of difficulty speaking or slurred speech:    Temporary loss of vision in one eye:     Problems with dizziness:          Gastrointestinal    Blood in stool:     Vomited blood:  Genitourinary    Burning when urinating:     Blood in urine:        Psychiatric    Major depression:         Hematologic    Bleeding problems:    Problems with blood clotting too easily:        Skin    Rashes or ulcers: x       Constitutional    Fever or chills:      PHYSICAL EXAM:   Vitals:   11/20/20 1343  BP: (!) 157/71  Pulse: (!) 59  Resp: 20  Temp: 98 F (36.7 C)  SpO2: 97%  Weight: 142 lb 12.8 oz (64.8 kg)  Height: 5\' 8"  (1.727 m)    GENERAL: The patient is a well-nourished male, in no acute distress. The vital signs are documented above. CARDIAC: There is a regular rate and rhythm.  VASCULAR: Nonpalpable pedal pulses PULMONARY: Non-labored respirations ABDOMEN: Soft and non-tender  MUSCULOSKELETAL: There are no major deformities or cyanosis. NEUROLOGIC: No focal weakness or paresthesias are detected. SKIN: Small area of skin breakdown on the tip of the left great toe at the nail edge. PSYCHIATRIC: The patient has a normal affect.  STUDIES:   I have reviewed the following studies Carotid duplex: Right Carotid: Velocities in the right ICA are consistent with a 40-59%         stenosis. Hemodynamically significant plaque >50%  visualized in         the CCA.   Left Carotid: Velocities in the left ICA are consistent with a 1-39%  stenosis.   Subclavians: Right subclavian artery was stenotic. Normal flow  hemodynamics were        seen in the left subclavian artery.   CT angiogram:  1. Heavily calcified abdominal aorta without aneurysm or high-grade stenosis. 2. Ostial stenoses of at least mild hemodynamic significance involving all 3 mesenteric arteries, but no bowel morphologic changes to suggest mesenteric ischemia. 3. RIGHT common iliac artery bifurcation occlusive disease of probable hemodynamic significance, with origin occlusion of the internal iliac  artery. 4. Tandem stenoses in the RIGHT external iliac artery of probable hemodynamic significance. 5. RIGHT SFA proximal occlusion, reconstituted mid thigh with diseased three-vessel runoff. 6. Multi segmental occlusive disease of the LEFT iliac system of possible hemodynamic significance. 7. LEFT SFA origin occlusion, reconstituted mid thigh, with tandem occlusive disease in the distal SFA and proximal popliteal. Reconstituted distal popliteal with three-vessel tibial runoff. 8. Left inguinal hernia involving small bowel, without obstruction or strangulation. 9. Colonic diverticulosis.  MEDICAL ISSUES:   Left toe ulcer in the setting of aortoiliac and femoral-popliteal occlusive disease: I had a lengthy discussion with the patient and his wife today.  I think his best option is going to be an aortobifemoral bypass graft with bilateral femoral endarterectomies.  He may also require a left femoral to below-knee popliteal bypass graft because of his new left great toe ulcer.  He is currently being evaluated for cardiac clearance.  If he is felt to be too high risk for intra-abdominal surgery, I could consider bilateral femoral endarterectomy with iliac stenting, however I do not think this will be as durable because of the small size of his iliac vessels.  In addition to this, he may require left femoral below-knee popliteal artery bypass graft, unless his toe ulcer appears to be getting better.  I discussed the risks and benefits of the procedure with the patient and his wife.  I discussed a  5 to 10% risk of major complication which includes renal failure, death, significant bleeding, infection.  All other questions were answered.  I am going to touch base with the patient again on Tuesday the day prior to his operation to make sure he does not have any questions and to confirm which operation we will proceed with.  I am starting him on a baby aspirin today.  He will continue his statin.  I will  schedule his surgery for Wednesday, June 1.    Leia Alf, MD, FACS [facility] [phone] Pager [phone]

## 2020-11-20 NOTE — H&P (View-Only) (Signed)
[facility] [place]  Patient name: Brian Yates MRN: [id number] DOB: 1940-07-09 Sex: male   REASON FOR VISIT:    Follow up  [place] ILLNESS:   Brian Yates is a 80 y.o. male, who I last saw in 2019.  At that time, he had been having difficulty with his legs and walking for a long period of time however it had gotten progressively worse.  He was able to walk approximately 1/4 mile before he had to stop secondary to tightening in his calves, the right bothers him more than the left.  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  Medical management was recommended.  He was not given Pletal because of a history of a GI bleed.    He returned in April of 2022 with worsening symptoms.  He now gets calf cramping less than 100 feet.  Right leg may be slightly worse.  He does not have rest pain.  He does not have any open ulcerations. 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  I felt that surgical revascularization via an aortobifemoral bypass graft would be best to address his needs.  I sent him for cardiology clearance and he is back today for further discussions.  Since I last saw him he has had his nail clipped and had a skin penetration.  He now has erythema and worsening pain.  He has a history of coronary artery disease.  He has been told that he has had a heart attack in the past.  He is on a statin for hypercholesterolemia.  He is blood pressure is medically managed.  He has been treated for lung cancer   PAST MEDICAL HISTORY:   Past Medical History:  Diagnosis Date  . Alcohol abuse   . Anemia due to gastrointestinal blood loss   . AR (allergic rhinitis)   . 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 Cervical disc disease   . Cervical radiculopathy   . COLD (chronic obstructive lung disease) ([place])   . Controlled gout   . Coronary atherosclerosis of native coronary artery   . Diverticulosis   . Diverticulosis of colon with hemorrhage   . Erectile dysfunction   . FH: colon cancer   . Glaucoma   . History of hepatitis B   . History of skin cancer   . Hx of colonic polyps   . Hyperlipidemia   . Hypertension   . Irritable bowel syndrome   . Mass of jaw   . Neuropathy of left foot   . Previous myocardial infarction older than 8 weeks 1998  . Tobacco abuse   . Tremor   . Vitamin B12 deficiency anemia      FAMILY HISTORY:   Family History  Problem Relation Age of Onset  . Hypertension Mother   . Hyperlipidemia Mother   . Colon cancer Mother   . Cancer - Other Mother   . Hypertension Father   . Hyperlipidemia Father   . Heart disease Father   . CAD Father   . Heart attack Father   . Alcohol abuse Father   . Breast cancer Sister   . Stroke Maternal Aunt   . Heart attack Paternal Grandfather   . Heart attack Paternal Uncle     SOCIAL HISTORY:   Social History  Tobacco Use  . Smoking status: Current Every Day Smoker    Packs/day: 1.50  . Smokeless tobacco: Never Used  . Tobacco comment: Trying to quit  Substance Use Topics  . Alcohol use: No     ALLERGIES:   Allergies  Allergen Reactions  . Bentyl [Dicyclomine Hcl] Other (See Comments)    Hallucinations   . Codeine Sulfate [Codeine] Nausea And Vomiting  . Dupixent [Dupilumab]     Double vision  . Lisinopril Other (See Comments)    unknown  . Valsartan Other (See Comments)    unknown     CURRENT MEDICATIONS:   Current Outpatient Medications  Medication Sig Dispense Refill  . albuterol (VENTOLIN HFA) 108 (90 Base) MCG/ACT inhaler Inhale 2 puffs into the lungs in the morning, at noon, in the evening, and at bedtime.    Marland Kitchen atorvastatin (LIPITOR) 40 MG tablet Take 20 mg by mouth  every evening.     . brimonidine (ALPHAGAN P) 0.1 % SOLN Place 1 drop into both eyes in the morning and at bedtime.     . carbidopa-levodopa (SINEMET IR) 10-100 MG tablet Take 1 tablet by mouth 3 (three) times daily.    . Dupilumab (DUPIXENT) 300 MG/2ML SOPN Inject 300 mg into the skin every 14 (fourteen) days.    . fenofibrate micronized (LOFIBRA) 134 MG capsule Take 134 mg by mouth every evening.     . hydrOXYzine (ATARAX/VISTARIL) 50 MG tablet Take 100 mg by mouth at bedtime as needed (sleep).    . isosorbide mononitrate (IMDUR) 30 MG 24 hr tablet Take 30 mg by mouth in the morning.    . latanoprost (XALATAN) 0.005 % ophthalmic solution Place 1 drop into both eyes at bedtime.    Edyth Gunnels Oleifera (MORINGA PO) Take 1,400 mg by mouth daily in the afternoon.    . nitroGLYCERIN (NITROSTAT) 0.4 MG SL tablet Place 1 tablet (0.4 mg total) under the tongue every 5 (five) minutes x 3 doses as needed for chest pain. 25 tablet 3  . tamsulosin (FLOMAX) 0.4 MG CAPS capsule Take 0.4 mg by mouth at bedtime.    . triamcinolone cream (KENALOG) 0.1 % Apply 1 application topically 4 (four) times daily as needed (itching.).     No current facility-administered medications for this visit.    REVIEW OF SYSTEMS:   [X]  denotes positive finding, [ ]  denotes negative finding Cardiac  Comments:  Chest pain or chest pressure:    Shortness of breath upon exertion:    Short of breath when lying flat:    Irregular heart rhythm:        Vascular    Pain in calf, thigh, or hip brought on by ambulation: x   Pain in feet at night that wakes you up from your sleep:  x   Blood clot in your veins:    Leg swelling:         Pulmonary    Oxygen at home:    Productive cough:     Wheezing:         Neurologic    Sudden weakness in arms or legs:     Sudden numbness in arms or legs:     Sudden onset of difficulty speaking or slurred speech:    Temporary loss of vision in one eye:     Problems with dizziness:          Gastrointestinal    Blood in stool:     Vomited blood:  Genitourinary    Burning when urinating:     Blood in urine:        Psychiatric    Major depression:         Hematologic    Bleeding problems:    Problems with blood clotting too easily:        Skin    Rashes or ulcers: x       Constitutional    Fever or chills:      PHYSICAL EXAM:   Vitals:   11/20/20 1343  BP: (!) 157/71  Pulse: (!) 59  Resp: 20  Temp: 98 F (36.7 C)  SpO2: 97%  Weight: 142 lb 12.8 oz (64.8 kg)  Height: 5\' 8"  (1.727 m)    GENERAL: The patient is a well-nourished male, in no acute distress. The vital signs are documented above. CARDIAC: There is a regular rate and rhythm.  VASCULAR: Nonpalpable pedal pulses PULMONARY: Non-labored respirations ABDOMEN: Soft and non-tender  MUSCULOSKELETAL: There are no major deformities or cyanosis. NEUROLOGIC: No focal weakness or paresthesias are detected. SKIN: Small area of skin breakdown on the tip of the left great toe at the nail edge. PSYCHIATRIC: The patient has a normal affect.  STUDIES:   I have reviewed the following studies Carotid duplex: Right Carotid: Velocities in the right ICA are consistent with a 40-59%         stenosis. Hemodynamically significant plaque >50%  visualized in         the CCA.   Left Carotid: Velocities in the left ICA are consistent with a 1-39%  stenosis.   Subclavians: Right subclavian artery was stenotic. Normal flow  hemodynamics were        seen in the left subclavian artery.   CT angiogram:  1. Heavily calcified abdominal aorta without aneurysm or high-grade stenosis. 2. Ostial stenoses of at least mild hemodynamic significance involving all 3 mesenteric arteries, but no bowel morphologic changes to suggest mesenteric ischemia. 3. RIGHT common iliac artery bifurcation occlusive disease of probable hemodynamic significance, with origin occlusion of the internal iliac  artery. 4. Tandem stenoses in the RIGHT external iliac artery of probable hemodynamic significance. 5. RIGHT SFA proximal occlusion, reconstituted mid thigh with diseased three-vessel runoff. 6. Multi segmental occlusive disease of the LEFT iliac system of possible hemodynamic significance. 7. LEFT SFA origin occlusion, reconstituted mid thigh, with tandem occlusive disease in the distal SFA and proximal popliteal. Reconstituted distal popliteal with three-vessel tibial runoff. 8. Left inguinal hernia involving small bowel, without obstruction or strangulation. 9. Colonic diverticulosis.  MEDICAL ISSUES:   Left toe ulcer in the setting of aortoiliac and femoral-popliteal occlusive disease: I had a lengthy discussion with the patient and his wife today.  I think his best option is going to be an aortobifemoral bypass graft with bilateral femoral endarterectomies.  He may also require a left femoral to below-knee popliteal bypass graft because of his new left great toe ulcer.  He is currently being evaluated for cardiac clearance.  If he is felt to be too high risk for intra-abdominal surgery, I could consider bilateral femoral endarterectomy with iliac stenting, however I do not think this will be as durable because of the small size of his iliac vessels.  In addition to this, he may require left femoral below-knee popliteal artery bypass graft, unless his toe ulcer appears to be getting better.  I discussed the risks and benefits of the procedure with the patient and his wife.  I discussed a  5 to 10% risk of major complication which includes renal failure, death, significant bleeding, infection.  All other questions were answered.  I am going to touch base with the patient again on Tuesday the day prior to his operation to make sure he does not have any questions and to confirm which operation we will proceed with.  I am starting him on a baby aspirin today.  He will continue his statin.  I will  schedule his surgery for Wednesday, June 1.    Leia Alf, MD, FACS [facility] [phone] Pager [phone]

## 2020-11-21 ENCOUNTER — Ambulatory Visit (INDEPENDENT_AMBULATORY_CARE_PROVIDER_SITE_OTHER): Payer: Medicare Other

## 2020-11-21 ENCOUNTER — Telehealth: Payer: Self-pay

## 2020-11-21 DIAGNOSIS — Z01810 Encounter for preprocedural cardiovascular examination: Secondary | ICD-10-CM | POA: Diagnosis not present

## 2020-11-21 DIAGNOSIS — I452 Bifascicular block: Secondary | ICD-10-CM | POA: Diagnosis not present

## 2020-11-21 DIAGNOSIS — I10 Essential (primary) hypertension: Secondary | ICD-10-CM | POA: Diagnosis not present

## 2020-11-21 DIAGNOSIS — E782 Mixed hyperlipidemia: Secondary | ICD-10-CM

## 2020-11-21 DIAGNOSIS — I350 Nonrheumatic aortic (valve) stenosis: Secondary | ICD-10-CM

## 2020-11-21 DIAGNOSIS — I2510 Atherosclerotic heart disease of native coronary artery without angina pectoris: Secondary | ICD-10-CM

## 2020-11-21 LAB — MYOCARDIAL PERFUSION IMAGING
LV dias vol: 146 mL (ref 62–150)
LV sys vol: 66 mL
Peak HR: 69 {beats}/min
Rest HR: 55 {beats}/min
SDS: 4
SRS: 6
SSS: 10
TID: 1

## 2020-11-21 MED ORDER — AMINOPHYLLINE 25 MG/ML IV SOLN
75.0000 mg | Freq: Once | INTRAVENOUS | Status: AC
  Administered 2020-11-21: 75 mg via INTRAVENOUS

## 2020-11-21 MED ORDER — TECHNETIUM TC 99M TETROFOSMIN IV KIT
29.2000 | PACK | Freq: Once | INTRAVENOUS | Status: AC | PRN
  Administered 2020-11-21: 29.2 via INTRAVENOUS

## 2020-11-21 MED ORDER — REGADENOSON 0.4 MG/5ML IV SOLN
0.4000 mg | Freq: Once | INTRAVENOUS | Status: AC
  Administered 2020-11-21: 0.4 mg via INTRAVENOUS

## 2020-11-21 MED ORDER — TECHNETIUM TC 99M TETROFOSMIN IV KIT
10.5000 | PACK | Freq: Once | INTRAVENOUS | Status: AC | PRN
  Administered 2020-11-21: 10.5 via INTRAVENOUS

## 2020-11-21 NOTE — Telephone Encounter (Signed)
Pt saw Dr. Bettina Gavia 11/10/20 for surgical clearance. Nuclear stress test and Echocardiogram have been ordered. Surgery is scheduled for 6/1. Myoview is scheduled for 11/21/2020. Echocardiogram is scheduled for 6/7.  Callback staff Please see if Echocardiogram can be done sooner so as to not delay surgery.  Richardson Dopp, PA-C    11/21/2020 10:25 AM

## 2020-11-21 NOTE — Telephone Encounter (Signed)
Routed surgical clearance over to [facility] to make them aware that pt has couple cardiac testing scheduled and results and clearance will be addressed as soon as results are in.

## 2020-11-21 NOTE — Telephone Encounter (Signed)
Please let surgeon's office know the dates of the stress test and the echocardiogram.  I will ask Dr. Bettina Gavia to address clearance as soon as results are back. Richardson Dopp, PA-C    11/21/2020 12:27 PM

## 2020-11-21 NOTE — Telephone Encounter (Signed)
Pt's echocardiogram has been moved up to 11/28/2020.  Will route back to the preop pool.

## 2020-11-21 NOTE — Telephone Encounter (Signed)
   [facility] Pre-operative Risk Assessment    Patient Name: Brian Yates  DOB: 1941/04/10  MRN: [id number]   [facility]ARE STAFF: - Please ensure there is not already an duplicate clearance open for this procedure. - Under Visit Info/Reason for Call, type in Other and utilize the format Clearance MM/DD/YY or Clearance TBD. Do not use dashes or single digits. - If request is for dental extraction, please clarify the # of teeth to be extracted.  Request for surgical clearance:  1. What type of surgery is being performed? AortoBifernoral bypass, possible left femoral-popliteal artery bypass vs bilateral femoral endarterectomy. Bilateral iliac stents and left femoral popliteal bypass.    2. When is this surgery scheduled? 12/28/2020   3. What type of clearance is required (medical clearance vs. Pharmacy clearance to hold med vs. Both)? Both  4. Are there any medications that need to be held prior to surgery and how long? None   5. Practice name and name of physician performing surgery?  [facility].    6. What is the office phone number? [phone]   7.   What is the office fax number? [phone]  8.   Anesthesia type (None, local, MAC, general) ? [place] 11/21/2020, 9:34 AM  _________________________________________________________________   (provider comments below)

## 2020-11-24 NOTE — Progress Notes (Signed)
Surgical Instructions    Your procedure is scheduled on June 1  Report to [facility] Main Entrance "A" at [phone] A.M., then check in with the Admitting office.  Call this number if you have problems the morning of surgery:  [phone]   If you have any questions prior to your surgery date call [phone]: Open Monday-Friday 8am-4pm    Remember:  Do not eat or drink after midnight the night before your surgery    Take these medicines the morning of surgery with A SIP OF WATER  albuterol (VENTOLIN HFA)  Please bring all inhalers with you the day of surgery.  Eye drops if needed carbidopa-levodopa (SINEMET IR) isosorbide mononitrate (IMDUR)  LORazepam (ATIVAN propranolol (INDERAL)   As of today, STOP taking any Aspirin (unless otherwise instructed by your surgeon) Aleve, Naproxen, Ibuprofen, Motrin, Advil, Goody's, BC's, all herbal medications, fish oil, and all vitamins.                     Do not wear jewelry, make up, or nail polish DO Not wear nail polish, gel polish, artificial nails, or any other type of covering on natural nails including finger and toenails. If patients have artificial nails, gel coating, etc. that need to be removed by a nail saloon please have this removed prior to surgery or surgery may need to be canceled/delayed if the surgeon/ anesthesia feels like the patient is unable to be adequately monitored.            Do not wear lotions, powders, perfumes/colognes, or deodorant.            Do not shave 48 hours prior to surgery.  Men may shave face and neck.            Do not bring valuables to the hospital.            [facility] is not responsible for any belongings or valuables.  Do NOT Smoke (Tobacco/Vaping) or drink Alcohol 24 hours prior to your procedure If you use a CPAP at night, you may bring all equipment for your overnight stay.   Contacts, glasses, dentures or bridgework may not be worn into surgery, please bring cases for these belongings   For  patients admitted to the hospital, discharge time will be determined by your treatment team.   Patients discharged the day of surgery will not be allowed to drive home, and someone needs to stay with them for 24 hours.    Special instructions:    Oral Hygiene is also important to reduce your risk of infection.  Remember - BRUSH YOUR TEETH THE MORNING OF SURGERY WITH YOUR REGULAR TOOTHPASTE   [place]- Preparing For Surgery  Before surgery, you can play an important role. Because skin is not sterile, your skin needs to be as free of germs as possible. You can reduce the number of germs on your skin by washing with CHG (chlorahexidine gluconate) Soap before surgery.  CHG is an antiseptic cleaner which kills germs and bonds with the skin to continue killing germs even after washing.     Please do not use if you have an allergy to CHG or antibacterial soaps. If your skin becomes reddened/irritated stop using the CHG.  Do not shave (including legs and underarms) for at least 48 hours prior to first CHG shower. It is OK to shave your face.  Please follow these instructions carefully.    1.  Shower the NIGHT BEFORE SURGERY and  the MORNING OF SURGERY with CHG Soap.   If you chose to wash your hair, wash your hair first as usual with your normal shampoo. After you shampoo, rinse your hair and body thoroughly to remove the shampoo.  Then [employer] and genitals (private parts) with your normal soap and rinse thoroughly to remove soap.  2. After that Use CHG Soap as you would any other liquid soap. You can apply CHG directly to the skin and wash gently with a scrungie or a clean washcloth.   3. Apply the CHG Soap to your body ONLY FROM THE NECK DOWN.  Do not use on open wounds or open sores. Avoid contact with your eyes, ears, mouth and genitals (private parts). Wash Face and genitals (private parts)  with your normal soap.   4. Wash thoroughly, paying special attention to the area where your  surgery will be performed.  5. Thoroughly rinse your body with warm water from the neck down.  6. DO NOT shower/wash with your normal soap after using and rinsing off the CHG Soap.  7. Pat yourself dry with a CLEAN TOWEL.  8. Wear CLEAN PAJAMAS to bed the night before surgery  9. Place CLEAN SHEETS on your bed the night before your surgery  10. DO NOT SLEEP WITH PETS.   Day of Surgery:  Take a shower with CHG soap. Wear Clean/Comfortable clothing the morning of surgery Do not apply any deodorants/lotions.   Remember to brush your teeth WITH YOUR REGULAR TOOTHPASTE.   Please read over the following fact sheets that you were given.

## 2020-11-28 ENCOUNTER — Encounter (HOSPITAL_COMMUNITY)
Admission: RE | Admit: 2020-11-28 | Discharge: 2020-11-28 | Disposition: A | Discharge status: Home / Self Care | Payer: Medicare Other | Source: Ambulatory Visit | Attending: Surgery | Admitting: Surgery

## 2020-11-28 ENCOUNTER — Other Ambulatory Visit (HOSPITAL_COMMUNITY): Payer: Medicare Other

## 2020-11-28 ENCOUNTER — Other Ambulatory Visit: Payer: Self-pay

## 2020-11-28 ENCOUNTER — Ambulatory Visit (INDEPENDENT_AMBULATORY_CARE_PROVIDER_SITE_OTHER): Payer: Medicare Other | Admitting: Surgery

## 2020-11-28 ENCOUNTER — Ambulatory Visit (HOSPITAL_BASED_OUTPATIENT_CLINIC_OR_DEPARTMENT_OTHER): Payer: Medicare Other

## 2020-11-28 ENCOUNTER — Encounter (HOSPITAL_COMMUNITY): Payer: Self-pay

## 2020-11-28 DIAGNOSIS — I10 Essential (primary) hypertension: Secondary | ICD-10-CM | POA: Insufficient documentation

## 2020-11-28 DIAGNOSIS — Z20822 Contact with and (suspected) exposure to covid-19: Secondary | ICD-10-CM | POA: Insufficient documentation

## 2020-11-28 DIAGNOSIS — Z01818 Encounter for other preprocedural examination: Secondary | ICD-10-CM | POA: Insufficient documentation

## 2020-11-28 DIAGNOSIS — I350 Nonrheumatic aortic (valve) stenosis: Secondary | ICD-10-CM | POA: Insufficient documentation

## 2020-11-28 DIAGNOSIS — I452 Bifascicular block: Secondary | ICD-10-CM | POA: Insufficient documentation

## 2020-11-28 DIAGNOSIS — I70213 Atherosclerosis of native arteries of extremities with intermittent claudication, bilateral legs: Secondary | ICD-10-CM

## 2020-11-28 DIAGNOSIS — I2510 Atherosclerotic heart disease of native coronary artery without angina pectoris: Secondary | ICD-10-CM | POA: Insufficient documentation

## 2020-11-28 DIAGNOSIS — Z01810 Encounter for preprocedural cardiovascular examination: Secondary | ICD-10-CM | POA: Insufficient documentation

## 2020-11-28 DIAGNOSIS — E782 Mixed hyperlipidemia: Secondary | ICD-10-CM | POA: Insufficient documentation

## 2020-11-28 HISTORY — DX: Dermatitis, unspecified: L30.9

## 2020-11-28 HISTORY — DX: Malignant (primary) neoplasm, unspecified: C80.1

## 2020-11-28 HISTORY — DX: Peripheral vascular disease, unspecified: I73.9

## 2020-11-28 HISTORY — DX: Irritable bowel syndrome, unspecified: K58.9

## 2020-11-28 HISTORY — DX: Residual foreign body in soft tissue: M79.5

## 2020-11-28 LAB — ECHOCARDIOGRAM COMPLETE
AR max vel: 1.98 cm2
AV Area VTI: 2.34 cm2
AV Area mean vel: 1.95 cm2
AV Mean grad: 12 mmHg
AV Peak grad: 24.2 mmHg
Ao pk vel: 2.46 m/s
Area-P 1/2: 2.62 cm2
Height: 68 in
P 1/2 time: 359 msec
S' Lateral: 2.8 cm
Weight: 2220.8 oz

## 2020-11-28 LAB — CBC
HCT: 36.8 % — ABNORMAL LOW (ref 39.0–52.0)
Hemoglobin: 12.4 g/dL — ABNORMAL LOW (ref 13.0–17.0)
MCH: 30 pg (ref 26.0–34.0)
MCHC: 33.7 g/dL (ref 30.0–36.0)
MCV: 89.1 fL (ref 80.0–100.0)
Platelets: 236 10*3/uL (ref 150–400)
RBC: 4.13 MIL/uL — ABNORMAL LOW (ref 4.22–5.81)
RDW: 15.1 % (ref 11.5–15.5)
WBC: 12.5 10*3/uL — ABNORMAL HIGH (ref 4.0–10.5)
nRBC: 0 % (ref 0.0–0.2)

## 2020-11-28 LAB — URINALYSIS, ROUTINE W REFLEX MICROSCOPIC
Bacteria, UA: NONE SEEN
Bilirubin Urine: NEGATIVE
Glucose, UA: NEGATIVE mg/dL
Hgb urine dipstick: NEGATIVE
Ketones, ur: NEGATIVE mg/dL
Leukocytes,Ua: NEGATIVE
Nitrite: NEGATIVE
Protein, ur: 100 mg/dL — AB
Specific Gravity, Urine: 1.009 (ref 1.005–1.030)
pH: 7 (ref 5.0–8.0)

## 2020-11-28 LAB — COMPREHENSIVE METABOLIC PANEL
ALT: 16 U/L (ref 0–44)
AST: 23 U/L (ref 15–41)
Albumin: 3.4 g/dL — ABNORMAL LOW (ref 3.5–5.0)
Alkaline Phosphatase: 42 U/L (ref 38–126)
Anion gap: 7 (ref 5–15)
BUN: 11 mg/dL (ref 8–23)
CO2: 26 mmol/L (ref 22–32)
Calcium: 9.7 mg/dL (ref 8.9–10.3)
Chloride: 98 mmol/L (ref 98–111)
Creatinine, Ser: 1.05 mg/dL (ref 0.61–1.24)
GFR, Estimated: >60 mL/min (ref >60)
Glucose, Bld: 92 mg/dL (ref 70–99)
Potassium: 3.8 mmol/L (ref 3.5–5.1)
Sodium: 131 mmol/L — ABNORMAL LOW (ref 135–145)
Total Bilirubin: 0.6 mg/dL (ref 0.3–1.2)
Total Protein: 6 g/dL — ABNORMAL LOW (ref 6.5–8.1)

## 2020-11-28 LAB — APTT: aPTT: 37 seconds — ABNORMAL HIGH (ref 24–36)

## 2020-11-28 LAB — SARS CORONAVIRUS 2 (TAT 6-24 HRS): SARS Coronavirus 2: NEGATIVE

## 2020-11-28 LAB — PROTIME-INR
INR: 1.1 (ref 0.8–1.2)
Prothrombin Time: 13.8 seconds (ref 11.4–15.2)

## 2020-11-28 LAB — SURGICAL PCR SCREEN
MRSA, PCR: NEGATIVE
Staphylococcus aureus: NEGATIVE

## 2020-11-28 NOTE — Progress Notes (Addendum)
PCP - Dr. Dora Sims  Cardiologist - Dr.Munley  Chest x-ray - 1- view-02/23/20  EKG - 11/09/20  Stress Test - 11/21/20  ECHO - scheduled for 11/28/20- for surgery cleananceclearance   Cardiac Cath -   Sleep Study - no CPAP - no  LABS-CBC, PT, PTT, T/S, UA, Covid, PCR- MRSA  ASA- continue - take the day of surgery  ERAS-no  HA1C-na Fasting Blood Sugar - na Checks Blood Sugar ___0__ times a day  Anesthesia-  Pt denies having chest pain, sob, or fever at this time. All instructions explained to the pt, with a verbal understanding of the material. Pt agrees to go over the instructions while at home for a better understanding. Pt also instructed to self quarantine after being tested for COVID-19. The opportunity to ask questions was provided.

## 2020-11-28 NOTE — Progress Notes (Signed)
I informed Sr. Tobias Alexander that patient had an ECHO today to see if patient can be cleared for Surgery

## 2020-11-28 NOTE — Progress Notes (Signed)
I called the patient tonight to go over his cardiac testing.  He told me that his ulcer on his left toe has completely healed.  He is apprehensive about the surgery and feels that he can tolerate the level of disability that he has.  I told him I would like to see him in the office in 1 month to examine his foot and to make sure that he does not want to pursue aortobifemoral bypass graft for severe claudication  Brian Yates

## 2020-11-29 ENCOUNTER — Inpatient Hospital Stay (HOSPITAL_COMMUNITY): Payer: Medicare Other | Admitting: Certified Registered Nurse Anesthetist

## 2020-11-29 ENCOUNTER — Other Ambulatory Visit: Payer: Self-pay

## 2020-11-29 ENCOUNTER — Encounter (HOSPITAL_COMMUNITY): Payer: Self-pay | Admitting: Surgery

## 2020-11-29 ENCOUNTER — Encounter (HOSPITAL_COMMUNITY): Admission: RE | Payer: Self-pay | Source: Home / Self Care

## 2020-11-29 ENCOUNTER — Inpatient Hospital Stay (HOSPITAL_COMMUNITY): Payer: Medicare Other

## 2020-11-29 ENCOUNTER — Inpatient Hospital Stay (HOSPITAL_COMMUNITY): Admission: RE | Admit: 2020-11-29 | Payer: Medicare Other | Source: Home / Self Care | Admitting: Surgery

## 2020-11-29 ENCOUNTER — Encounter (HOSPITAL_COMMUNITY): Admission: RE | Disposition: E | Payer: Self-pay | Attending: Surgery

## 2020-11-29 ENCOUNTER — Inpatient Hospital Stay (HOSPITAL_COMMUNITY)
Admission: RE | Admit: 2020-11-29 | Discharge: 2020-12-29 | DRG: 268 | Disposition: E | Payer: Medicare Other | Attending: Surgery | Admitting: Surgery

## 2020-11-29 DIAGNOSIS — Z8719 Personal history of other diseases of the digestive system: Secondary | ICD-10-CM

## 2020-11-29 DIAGNOSIS — G9341 Metabolic encephalopathy: Secondary | ICD-10-CM | POA: Diagnosis not present

## 2020-11-29 DIAGNOSIS — R579 Shock, unspecified: Secondary | ICD-10-CM

## 2020-11-29 DIAGNOSIS — I70203 Unspecified atherosclerosis of native arteries of extremities, bilateral legs: Secondary | ICD-10-CM | POA: Diagnosis present

## 2020-11-29 DIAGNOSIS — N529 Male erectile dysfunction, unspecified: Secondary | ICD-10-CM | POA: Diagnosis present

## 2020-11-29 DIAGNOSIS — N170 Acute kidney failure with tubular necrosis: Secondary | ICD-10-CM | POA: Diagnosis not present

## 2020-11-29 DIAGNOSIS — I313 Pericardial effusion (noninflammatory): Secondary | ICD-10-CM | POA: Diagnosis not present

## 2020-11-29 DIAGNOSIS — I350 Nonrheumatic aortic (valve) stenosis: Principal | ICD-10-CM | POA: Diagnosis present

## 2020-11-29 DIAGNOSIS — R6521 Severe sepsis with septic shock: Secondary | ICD-10-CM | POA: Diagnosis not present

## 2020-11-29 DIAGNOSIS — I70213 Atherosclerosis of native arteries of extremities with intermittent claudication, bilateral legs: Secondary | ICD-10-CM | POA: Diagnosis not present

## 2020-11-29 DIAGNOSIS — K589 Irritable bowel syndrome without diarrhea: Secondary | ICD-10-CM | POA: Diagnosis present

## 2020-11-29 DIAGNOSIS — F1010 Alcohol abuse, uncomplicated: Secondary | ICD-10-CM | POA: Diagnosis present

## 2020-11-29 DIAGNOSIS — I214 Non-ST elevation (NSTEMI) myocardial infarction: Secondary | ICD-10-CM | POA: Diagnosis not present

## 2020-11-29 DIAGNOSIS — K5731 Diverticulosis of large intestine without perforation or abscess with bleeding: Secondary | ICD-10-CM | POA: Diagnosis present

## 2020-11-29 DIAGNOSIS — I358 Other nonrheumatic aortic valve disorders: Secondary | ICD-10-CM | POA: Diagnosis present

## 2020-11-29 DIAGNOSIS — H409 Unspecified glaucoma: Secondary | ICD-10-CM | POA: Diagnosis present

## 2020-11-29 DIAGNOSIS — C3412 Malignant neoplasm of upper lobe, left bronchus or lung: Secondary | ICD-10-CM | POA: Diagnosis present

## 2020-11-29 DIAGNOSIS — R570 Cardiogenic shock: Secondary | ICD-10-CM | POA: Diagnosis not present

## 2020-11-29 DIAGNOSIS — Z8249 Family history of ischemic heart disease and other diseases of the circulatory system: Secondary | ICD-10-CM

## 2020-11-29 DIAGNOSIS — I772 Rupture of artery: Secondary | ICD-10-CM | POA: Diagnosis present

## 2020-11-29 DIAGNOSIS — K55031 Focal (segmental) acute (reversible) ischemia of large intestine: Secondary | ICD-10-CM | POA: Diagnosis not present

## 2020-11-29 DIAGNOSIS — I2510 Atherosclerotic heart disease of native coronary artery without angina pectoris: Secondary | ICD-10-CM | POA: Diagnosis present

## 2020-11-29 DIAGNOSIS — Y832 Surgical operation with anastomosis, bypass or graft as the cause of abnormal reaction of the patient, or of later complication, without mention of misadventure at the time of the procedure: Secondary | ICD-10-CM | POA: Diagnosis not present

## 2020-11-29 DIAGNOSIS — Z923 Personal history of irradiation: Secondary | ICD-10-CM

## 2020-11-29 DIAGNOSIS — I288 Other diseases of pulmonary vessels: Secondary | ICD-10-CM | POA: Diagnosis present

## 2020-11-29 DIAGNOSIS — D62 Acute posthemorrhagic anemia: Secondary | ICD-10-CM | POA: Diagnosis not present

## 2020-11-29 DIAGNOSIS — A419 Sepsis, unspecified organism: Secondary | ICD-10-CM | POA: Diagnosis not present

## 2020-11-29 DIAGNOSIS — Z9911 Dependence on respirator [ventilator] status: Secondary | ICD-10-CM | POA: Diagnosis not present

## 2020-11-29 DIAGNOSIS — L309 Dermatitis, unspecified: Secondary | ICD-10-CM | POA: Diagnosis present

## 2020-11-29 DIAGNOSIS — R34 Anuria and oliguria: Secondary | ICD-10-CM | POA: Diagnosis not present

## 2020-11-29 DIAGNOSIS — Z83438 Family history of other disorder of lipoprotein metabolism and other lipidemia: Secondary | ICD-10-CM

## 2020-11-29 DIAGNOSIS — K55049 Acute infarction of large intestine, extent unspecified: Secondary | ICD-10-CM | POA: Diagnosis not present

## 2020-11-29 DIAGNOSIS — I7409 Other arterial embolism and thrombosis of abdominal aorta: Secondary | ICD-10-CM | POA: Diagnosis present

## 2020-11-29 DIAGNOSIS — D696 Thrombocytopenia, unspecified: Secondary | ICD-10-CM | POA: Diagnosis not present

## 2020-11-29 DIAGNOSIS — J9602 Acute respiratory failure with hypercapnia: Secondary | ICD-10-CM | POA: Diagnosis not present

## 2020-11-29 DIAGNOSIS — Z800 Family history of malignant neoplasm of digestive organs: Secondary | ICD-10-CM

## 2020-11-29 DIAGNOSIS — I469 Cardiac arrest, cause unspecified: Secondary | ICD-10-CM | POA: Diagnosis not present

## 2020-11-29 DIAGNOSIS — J449 Chronic obstructive pulmonary disease, unspecified: Secondary | ICD-10-CM | POA: Diagnosis present

## 2020-11-29 DIAGNOSIS — Z811 Family history of alcohol abuse and dependence: Secondary | ICD-10-CM

## 2020-11-29 DIAGNOSIS — E785 Hyperlipidemia, unspecified: Secondary | ICD-10-CM | POA: Diagnosis present

## 2020-11-29 DIAGNOSIS — K429 Umbilical hernia without obstruction or gangrene: Secondary | ICD-10-CM | POA: Diagnosis present

## 2020-11-29 DIAGNOSIS — I25110 Atherosclerotic heart disease of native coronary artery with unstable angina pectoris: Secondary | ICD-10-CM | POA: Diagnosis not present

## 2020-11-29 DIAGNOSIS — I82890 Acute embolism and thrombosis of other specified veins: Secondary | ICD-10-CM | POA: Diagnosis not present

## 2020-11-29 DIAGNOSIS — K7200 Acute and subacute hepatic failure without coma: Secondary | ICD-10-CM | POA: Diagnosis not present

## 2020-11-29 DIAGNOSIS — I77819 Aortic ectasia, unspecified site: Secondary | ICD-10-CM | POA: Diagnosis present

## 2020-11-29 DIAGNOSIS — E871 Hypo-osmolality and hyponatremia: Secondary | ICD-10-CM | POA: Diagnosis not present

## 2020-11-29 DIAGNOSIS — Q256 Stenosis of pulmonary artery: Secondary | ICD-10-CM | POA: Diagnosis not present

## 2020-11-29 DIAGNOSIS — R000 Tachycardia, unspecified: Secondary | ICD-10-CM | POA: Diagnosis not present

## 2020-11-29 DIAGNOSIS — J309 Allergic rhinitis, unspecified: Secondary | ICD-10-CM | POA: Diagnosis present

## 2020-11-29 DIAGNOSIS — Z79899 Other long term (current) drug therapy: Secondary | ICD-10-CM

## 2020-11-29 DIAGNOSIS — E872 Acidosis: Secondary | ICD-10-CM | POA: Diagnosis not present

## 2020-11-29 DIAGNOSIS — I452 Bifascicular block: Secondary | ICD-10-CM | POA: Diagnosis not present

## 2020-11-29 DIAGNOSIS — M47812 Spondylosis without myelopathy or radiculopathy, cervical region: Secondary | ICD-10-CM | POA: Diagnosis present

## 2020-11-29 DIAGNOSIS — D519 Vitamin B12 deficiency anemia, unspecified: Secondary | ICD-10-CM | POA: Diagnosis present

## 2020-11-29 DIAGNOSIS — K9189 Other postprocedural complications and disorders of digestive system: Secondary | ICD-10-CM | POA: Diagnosis not present

## 2020-11-29 DIAGNOSIS — I4901 Ventricular fibrillation: Secondary | ICD-10-CM | POA: Diagnosis not present

## 2020-11-29 DIAGNOSIS — E440 Moderate protein-calorie malnutrition: Secondary | ICD-10-CM | POA: Diagnosis present

## 2020-11-29 DIAGNOSIS — K661 Hemoperitoneum: Secondary | ICD-10-CM | POA: Diagnosis not present

## 2020-11-29 DIAGNOSIS — G20 Parkinson's disease: Secondary | ICD-10-CM | POA: Diagnosis present

## 2020-11-29 DIAGNOSIS — K660 Peritoneal adhesions (postprocedural) (postinfection): Secondary | ICD-10-CM | POA: Diagnosis present

## 2020-11-29 DIAGNOSIS — R578 Other shock: Secondary | ICD-10-CM | POA: Diagnosis not present

## 2020-11-29 DIAGNOSIS — J9601 Acute respiratory failure with hypoxia: Secondary | ICD-10-CM | POA: Diagnosis not present

## 2020-11-29 DIAGNOSIS — I10 Essential (primary) hypertension: Secondary | ICD-10-CM | POA: Diagnosis present

## 2020-11-29 DIAGNOSIS — M5010 Cervical disc disorder with radiculopathy, unspecified cervical region: Secondary | ICD-10-CM | POA: Diagnosis present

## 2020-11-29 DIAGNOSIS — Z20822 Contact with and (suspected) exposure to covid-19: Secondary | ICD-10-CM | POA: Diagnosis present

## 2020-11-29 DIAGNOSIS — D649 Anemia, unspecified: Secondary | ICD-10-CM | POA: Diagnosis not present

## 2020-11-29 DIAGNOSIS — R230 Cyanosis: Secondary | ICD-10-CM | POA: Diagnosis present

## 2020-11-29 DIAGNOSIS — B1910 Unspecified viral hepatitis B without hepatic coma: Secondary | ICD-10-CM | POA: Diagnosis present

## 2020-11-29 DIAGNOSIS — Z823 Family history of stroke: Secondary | ICD-10-CM

## 2020-11-29 DIAGNOSIS — I700 Atherosclerosis of aorta: Secondary | ICD-10-CM | POA: Diagnosis present

## 2020-11-29 DIAGNOSIS — Z452 Encounter for adjustment and management of vascular access device: Secondary | ICD-10-CM

## 2020-11-29 DIAGNOSIS — F17210 Nicotine dependence, cigarettes, uncomplicated: Secondary | ICD-10-CM | POA: Diagnosis present

## 2020-11-29 DIAGNOSIS — L97529 Non-pressure chronic ulcer of other part of left foot with unspecified severity: Secondary | ICD-10-CM | POA: Diagnosis present

## 2020-11-29 DIAGNOSIS — E875 Hyperkalemia: Secondary | ICD-10-CM | POA: Diagnosis not present

## 2020-11-29 DIAGNOSIS — D689 Coagulation defect, unspecified: Secondary | ICD-10-CM | POA: Diagnosis not present

## 2020-11-29 DIAGNOSIS — Z85828 Personal history of other malignant neoplasm of skin: Secondary | ICD-10-CM

## 2020-11-29 DIAGNOSIS — M109 Gout, unspecified: Secondary | ICD-10-CM | POA: Diagnosis present

## 2020-11-29 DIAGNOSIS — K559 Vascular disorder of intestine, unspecified: Secondary | ICD-10-CM | POA: Diagnosis not present

## 2020-11-29 DIAGNOSIS — Z803 Family history of malignant neoplasm of breast: Secondary | ICD-10-CM

## 2020-11-29 DIAGNOSIS — G5792 Unspecified mononeuropathy of left lower limb: Secondary | ICD-10-CM | POA: Diagnosis present

## 2020-11-29 DIAGNOSIS — K567 Ileus, unspecified: Secondary | ICD-10-CM | POA: Diagnosis not present

## 2020-11-29 DIAGNOSIS — I252 Old myocardial infarction: Secondary | ICD-10-CM

## 2020-11-29 DIAGNOSIS — I97638 Postprocedural hematoma of a circulatory system organ or structure following other circulatory system procedure: Secondary | ICD-10-CM | POA: Diagnosis not present

## 2020-11-29 DIAGNOSIS — Z7982 Long term (current) use of aspirin: Secondary | ICD-10-CM

## 2020-11-29 DIAGNOSIS — Q251 Coarctation of aorta: Secondary | ICD-10-CM | POA: Diagnosis present

## 2020-11-29 DIAGNOSIS — Z6822 Body mass index (BMI) 22.0-22.9, adult: Secondary | ICD-10-CM

## 2020-11-29 DIAGNOSIS — I711 Thoracic aortic aneurysm, ruptured: Secondary | ICD-10-CM | POA: Diagnosis not present

## 2020-11-29 DIAGNOSIS — R778 Other specified abnormalities of plasma proteins: Secondary | ICD-10-CM | POA: Diagnosis not present

## 2020-11-29 DIAGNOSIS — S301XXA Contusion of abdominal wall, initial encounter: Secondary | ICD-10-CM | POA: Diagnosis not present

## 2020-11-29 DIAGNOSIS — N179 Acute kidney failure, unspecified: Secondary | ICD-10-CM | POA: Diagnosis not present

## 2020-11-29 DIAGNOSIS — I97121 Postprocedural cardiac arrest following other surgery: Secondary | ICD-10-CM

## 2020-11-29 DIAGNOSIS — Z978 Presence of other specified devices: Secondary | ICD-10-CM

## 2020-11-29 HISTORY — PX: AORTA - BILATERAL FEMORAL ARTERY BYPASS GRAFT: SHX1175

## 2020-11-29 LAB — POCT I-STAT 7, (LYTES, BLD GAS, ICA,H+H)
Acid-base deficit: 1 mmol/L (ref 0.0–2.0)
Acid-base deficit: 2 mmol/L (ref 0.0–2.0)
Acid-base deficit: 4 mmol/L — ABNORMAL HIGH (ref 0.0–2.0)
Acid-base deficit: 4 mmol/L — ABNORMAL HIGH (ref 0.0–2.0)
Bicarbonate: 24.1 mmol/L (ref 20.0–28.0)
Bicarbonate: 24.8 mmol/L (ref 20.0–28.0)
Bicarbonate: 22.5 mmol/L (ref 20.0–28.0)
Bicarbonate: 22.2 mmol/L (ref 20.0–28.0)
Calcium, Ion: 1.25 mmol/L (ref 1.15–1.40)
Calcium, Ion: 1.27 mmol/L (ref 1.15–1.40)
Calcium, Ion: 1.21 mmol/L (ref 1.15–1.40)
Calcium, Ion: 1.15 mmol/L (ref 1.15–1.40)
HCT: 27 % — ABNORMAL LOW (ref 39.0–52.0)
HCT: 28 % — ABNORMAL LOW (ref 39.0–52.0)
HCT: 29 % — ABNORMAL LOW (ref 39.0–52.0)
HCT: 31 % — ABNORMAL LOW (ref 39.0–52.0)
Hemoglobin: 9.2 g/dL — ABNORMAL LOW (ref 13.0–17.0)
Hemoglobin: 9.5 g/dL — ABNORMAL LOW (ref 13.0–17.0)
Hemoglobin: 9.9 g/dL — ABNORMAL LOW (ref 13.0–17.0)
Hemoglobin: 10.5 g/dL — ABNORMAL LOW (ref 13.0–17.0)
O2 Saturation: 100 %
O2 Saturation: 100 %
O2 Saturation: 100 %
O2 Saturation: 99 %
Potassium: 3.4 mmol/L — ABNORMAL LOW (ref 3.5–5.1)
Potassium: 3.9 mmol/L (ref 3.5–5.1)
Potassium: 4.1 mmol/L (ref 3.5–5.1)
Potassium: 4.3 mmol/L (ref 3.5–5.1)
Sodium: 136 mmol/L (ref 135–145)
Sodium: 135 mmol/L (ref 135–145)
Sodium: 135 mmol/L (ref 135–145)
Sodium: 135 mmol/L (ref 135–145)
TCO2: 25 mmol/L (ref 22–32)
TCO2: 26 mmol/L (ref 22–32)
TCO2: 24 mmol/L (ref 22–32)
TCO2: 24 mmol/L (ref 22–32)
pCO2 arterial: 43.4 mmHg (ref 32.0–48.0)
pCO2 arterial: 49.1 mmHg — ABNORMAL HIGH (ref 32.0–48.0)
pCO2 arterial: 45.6 mmHg (ref 32.0–48.0)
pCO2 arterial: 46.2 mmHg (ref 32.0–48.0)
pH, Arterial: 7.353 (ref 7.350–7.450)
pH, Arterial: 7.31 — ABNORMAL LOW (ref 7.350–7.450)
pH, Arterial: 7.301 — ABNORMAL LOW (ref 7.350–7.450)
pH, Arterial: 7.29 — ABNORMAL LOW (ref 7.350–7.450)
pO2, Arterial: 188 mmHg — ABNORMAL HIGH (ref 83.0–108.0)
pO2, Arterial: 199 mmHg — ABNORMAL HIGH (ref 83.0–108.0)
pO2, Arterial: 204 mmHg — ABNORMAL HIGH (ref 83.0–108.0)
pO2, Arterial: 186 mmHg — ABNORMAL HIGH (ref 83.0–108.0)

## 2020-11-29 LAB — BASIC METABOLIC PANEL
Anion gap: 8 (ref 5–15)
BUN: 15 mg/dL (ref 8–23)
CO2: 20 mmol/L — ABNORMAL LOW (ref 22–32)
Calcium: 8.1 mg/dL — ABNORMAL LOW (ref 8.9–10.3)
Chloride: 104 mmol/L (ref 98–111)
Creatinine, Ser: 1.24 mg/dL (ref 0.61–1.24)
GFR, Estimated: 60 mL/min — ABNORMAL LOW (ref >60)
Glucose, Bld: 173 mg/dL — ABNORMAL HIGH (ref 70–99)
Potassium: 4.3 mmol/L (ref 3.5–5.1)
Sodium: 132 mmol/L — ABNORMAL LOW (ref 135–145)

## 2020-11-29 LAB — POCT ACTIVATED CLOTTING TIME
Activated Clotting Time: 208 seconds
Activated Clotting Time: 232 seconds
Activated Clotting Time: 184 seconds
Activated Clotting Time: 208 seconds
Activated Clotting Time: 220 seconds

## 2020-11-29 LAB — BLOOD GAS, ARTERIAL
Acid-base deficit: 5.8 mmol/L — ABNORMAL HIGH (ref 0.0–2.0)
Bicarbonate: 19.5 mmol/L — ABNORMAL LOW (ref 20.0–28.0)
Drawn by: 40072
FIO2: 32
O2 Saturation: 97.2 %
Patient temperature: 36.3
pCO2 arterial: 39.7 mmHg (ref 32.0–48.0)
pH, Arterial: 7.308 — ABNORMAL LOW (ref 7.350–7.450)
pO2, Arterial: 99.2 mmHg (ref 83.0–108.0)

## 2020-11-29 LAB — APTT: aPTT: 34 seconds (ref 24–36)

## 2020-11-29 LAB — PREPARE RBC (CROSSMATCH)

## 2020-11-29 LAB — PROTIME-INR
INR: 1.2 (ref 0.8–1.2)
Prothrombin Time: 15.3 seconds — ABNORMAL HIGH (ref 11.4–15.2)

## 2020-11-29 LAB — CBC
HCT: 32.9 % — ABNORMAL LOW (ref 39.0–52.0)
Hemoglobin: 11.1 g/dL — ABNORMAL LOW (ref 13.0–17.0)
MCH: 30.7 pg (ref 26.0–34.0)
MCHC: 33.7 g/dL (ref 30.0–36.0)
MCV: 90.9 fL (ref 80.0–100.0)
Platelets: 200 10*3/uL (ref 150–400)
RBC: 3.62 MIL/uL — ABNORMAL LOW (ref 4.22–5.81)
RDW: 15 % (ref 11.5–15.5)
WBC: 38.4 10*3/uL — ABNORMAL HIGH (ref 4.0–10.5)
nRBC: 0 % (ref 0.0–0.2)

## 2020-11-29 LAB — ABO/RH: ABO/RH(D): O POS

## 2020-11-29 LAB — MAGNESIUM: Magnesium: 1.3 mg/dL — ABNORMAL LOW (ref 1.7–2.4)

## 2020-11-29 IMAGING — DX DG ABD PORTABLE 1V
1 series · 1 of 1 positions shown · non-contrast
Comparison: None.

CLINICAL DATA: Status post aortic bypass surgery

EXAM:
PORTABLE ABDOMEN - 1 VIEW

[abdomen supine]
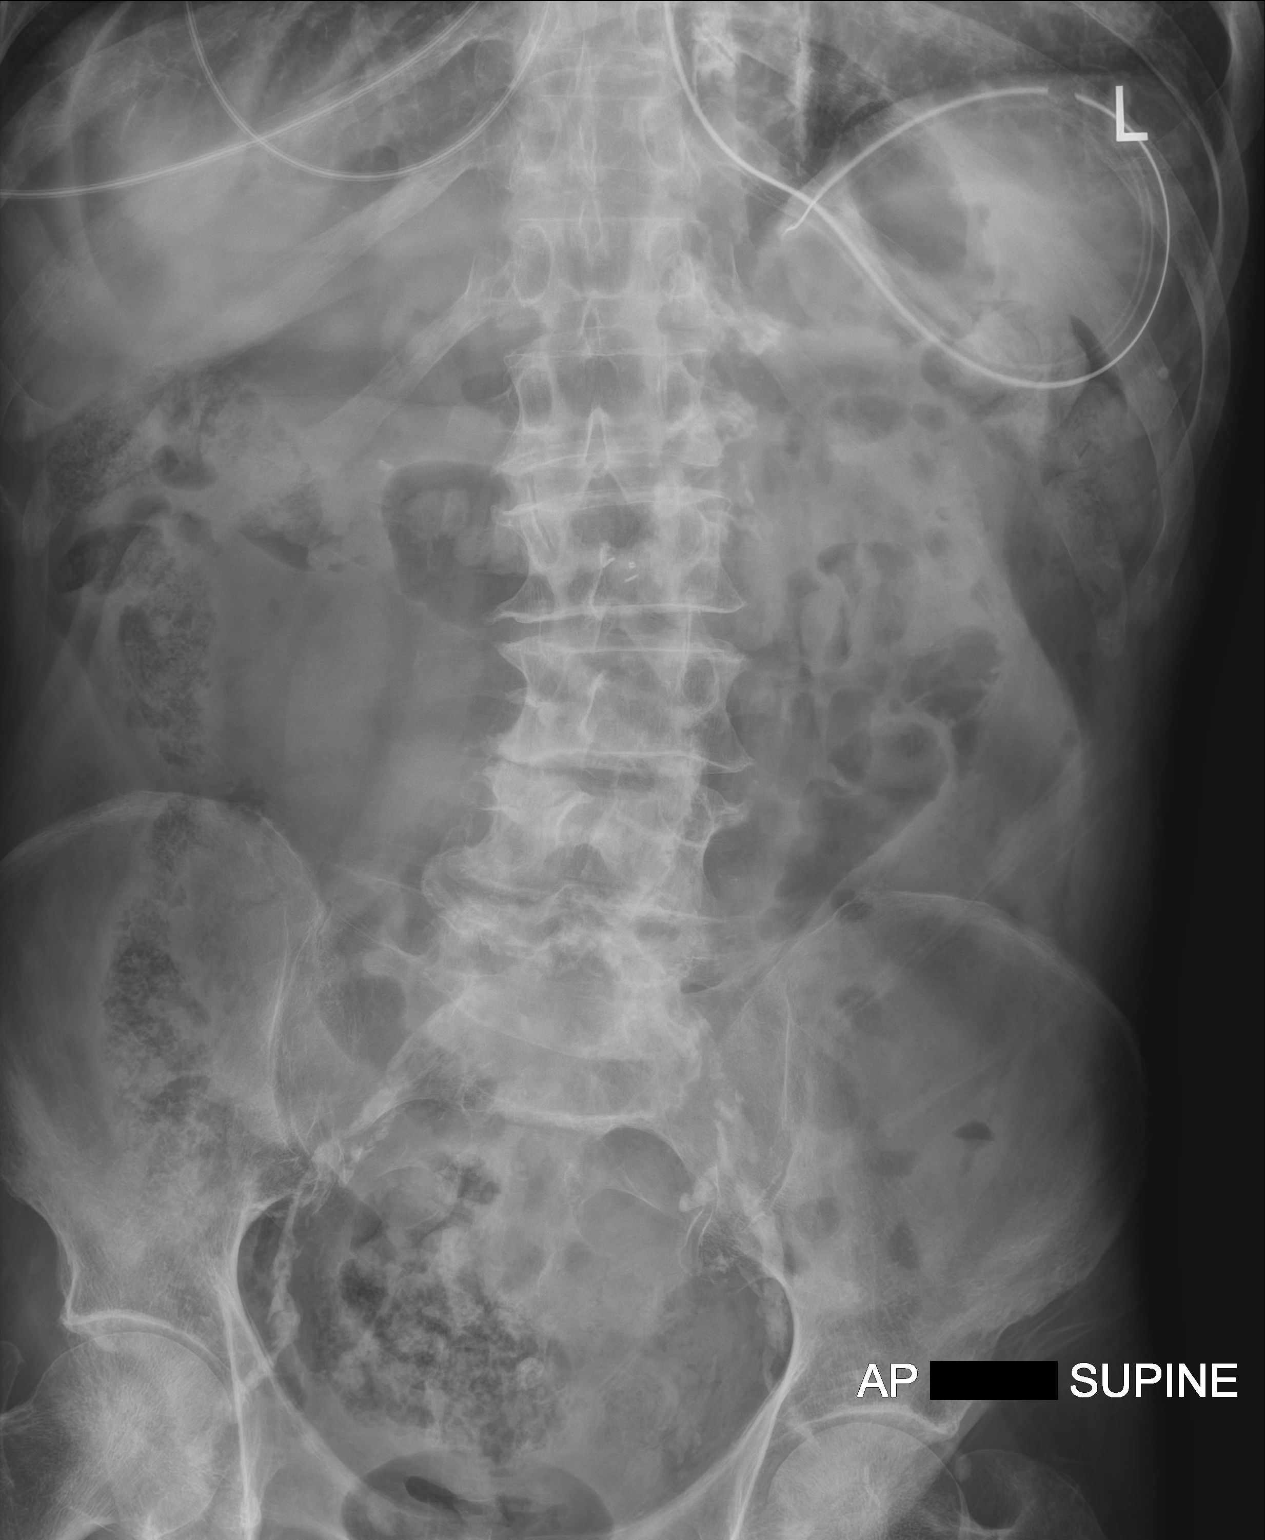

[1 of 1 positions shown; findings below may reference images not displayed]

FINDINGS: Scattered large and small bowel gas is noted. Diffuse aortic
calcifications are noted extending into the iliac vessels. Gastric
catheter is noted coiled within the stomach. Degenerative changes of
lumbar spine are noted.
IMPRESSION: No acute abnormality noted.

## 2020-11-29 IMAGING — DX DG CHEST 1V PORT
1 series · 1 of 1 positions shown · non-contrast
Comparison: [DATE]

CLINICAL DATA: Postop evaluation after aortic bypass

EXAM:
PORTABLE CHEST 1 VIEW

[chest ap]
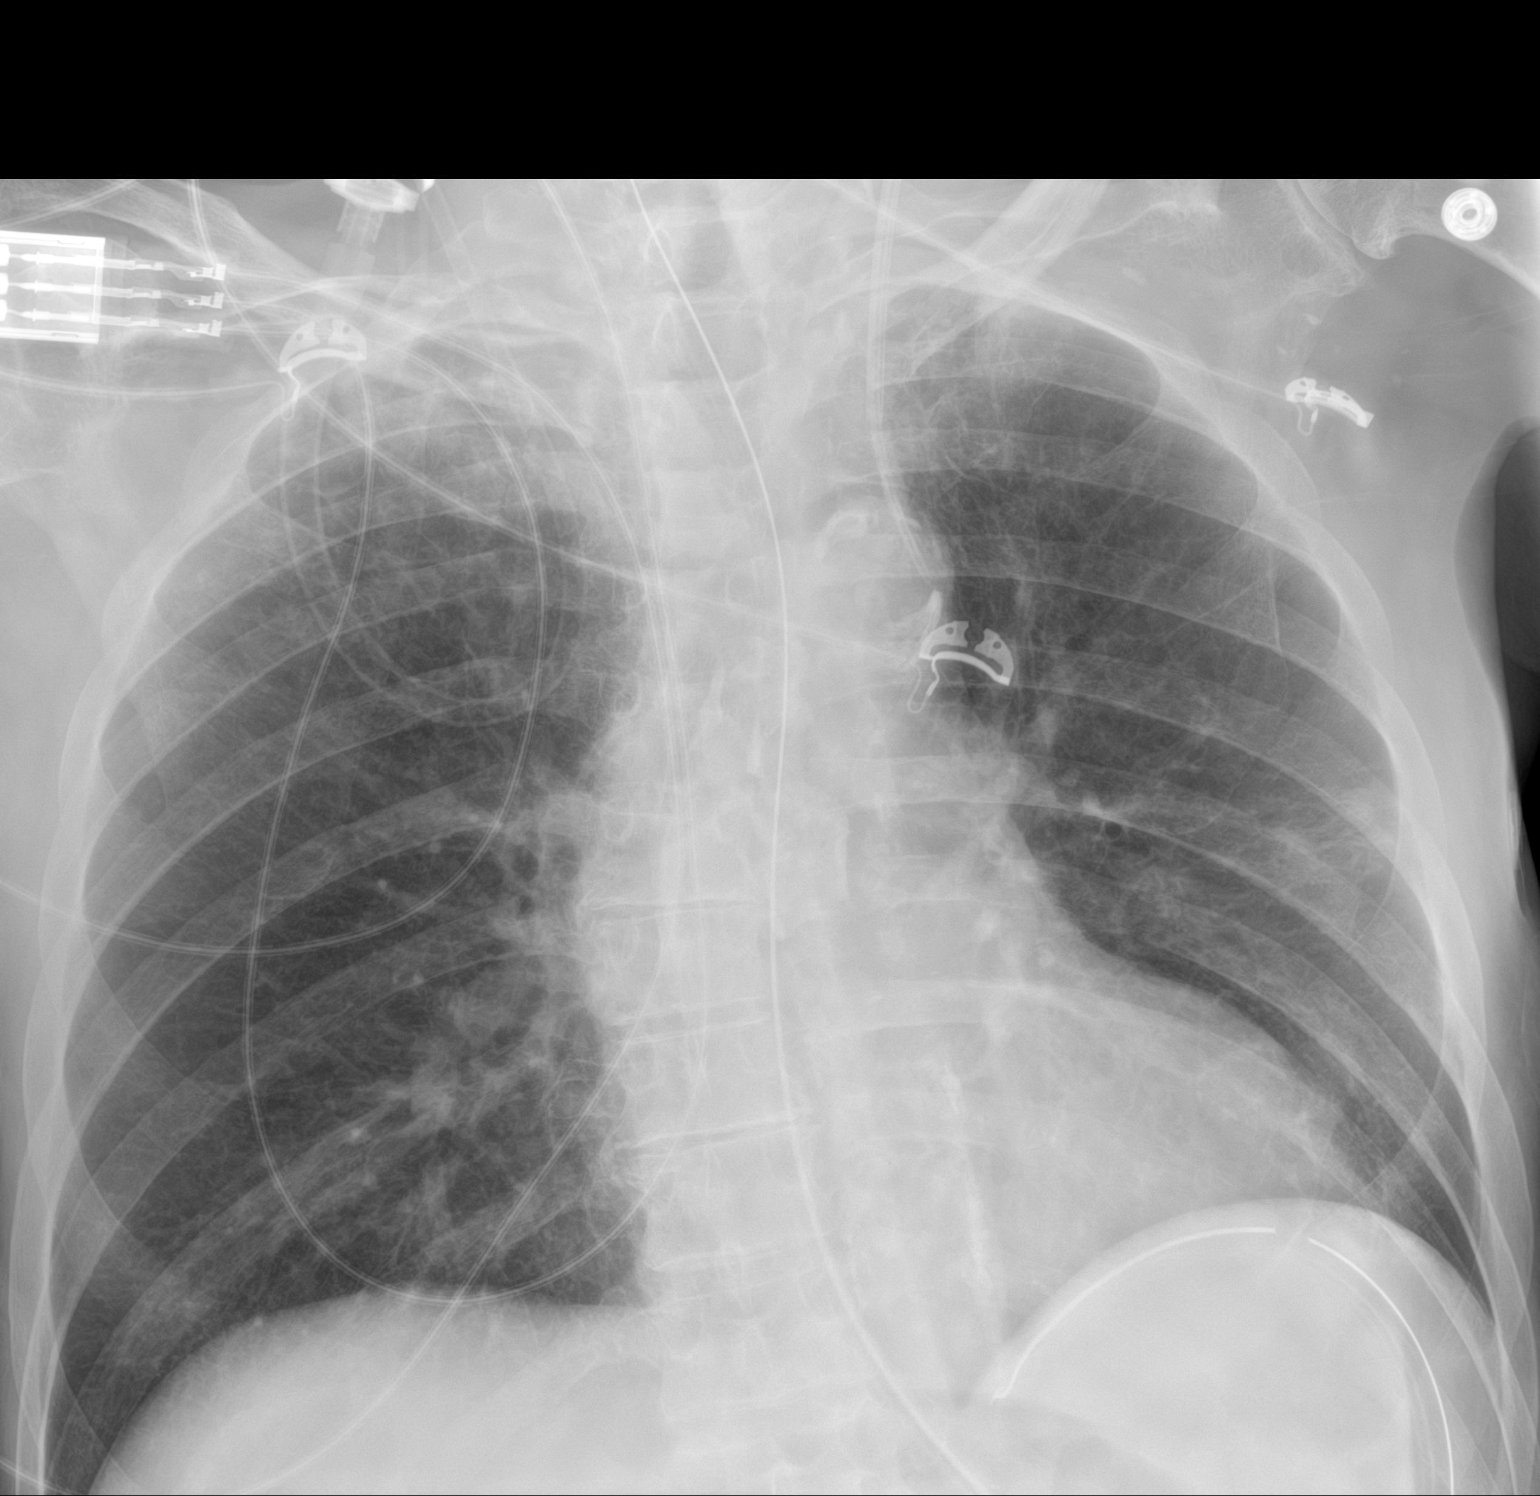

[1 of 1 positions shown; findings below may reference images not displayed]

FINDINGS: Cardiac shadow is stable. Gastric catheter is noted coiled within
the stomach. Lungs are well aerated bilaterally. Patchy density is
again seen in the left mid lung stable from the prior study.
Previously seen pneumothorax has resolved. No bony abnormality is
noted. Left jugular central line is noted. Tip projects over the
aortic knob but likely within the left innominate vein.
IMPRESSION: No acute abnormality noted.

## 2020-11-29 SURGERY — CREATION, BYPASS, ARTERIAL, AORTA TO FEMORAL, BILATERAL, USING GRAFT
Anesthesia: General

## 2020-11-29 SURGERY — CREATION, BYPASS, ARTERIAL, AORTA TO FEMORAL, BILATERAL, USING GRAFT
Anesthesia: General | Laterality: Left

## 2020-11-29 MED ORDER — FENTANYL CITRATE (PF) 250 MCG/5ML IJ SOLN
INTRAMUSCULAR | Status: DC | PRN
  Administered 2020-11-29 (×2): 50 ug via INTRAVENOUS
  Administered 2020-11-29: 100 ug via INTRAVENOUS
  Administered 2020-11-29: 50 ug via INTRAVENOUS

## 2020-11-29 MED ORDER — LABETALOL HCL 5 MG/ML IV SOLN: 10.0000 mg | INTRAVENOUS | Status: DC | PRN

## 2020-11-29 MED ORDER — CHLORHEXIDINE GLUCONATE CLOTH 2 % EX PADS: 6.0000 | MEDICATED_PAD | Freq: Once | CUTANEOUS | Status: DC

## 2020-11-29 MED ORDER — ALBUMIN HUMAN 5 % IV SOLN: INTRAVENOUS | Status: DC | PRN

## 2020-11-29 MED ORDER — BISACODYL 10 MG RE SUPP: 10.0000 mg | Freq: Every day | RECTAL | Status: DC | PRN

## 2020-11-29 MED ORDER — FENTANYL CITRATE (PF) 100 MCG/2ML IJ SOLN
25.0000 ug | INTRAMUSCULAR | Status: DC | PRN
  Administered 2020-11-29 (×2): 50 ug via INTRAVENOUS

## 2020-11-29 MED ORDER — PHENOL 1.4 % MT LIQD: 1.0000 | OROMUCOSAL | Status: DC | PRN

## 2020-11-29 MED ORDER — ONDANSETRON HCL 4 MG/2ML IJ SOLN: 4.0000 mg | Freq: Once | INTRAMUSCULAR | Status: DC | PRN

## 2020-11-29 MED ORDER — FENTANYL CITRATE (PF) 100 MCG/2ML IJ SOLN
INTRAMUSCULAR | Status: AC
  Filled 2020-11-29: qty 2

## 2020-11-29 MED ORDER — EPHEDRINE SULFATE-NACL 50-0.9 MG/10ML-% IV SOSY
PREFILLED_SYRINGE | INTRAVENOUS | Status: DC | PRN
  Administered 2020-11-29 (×2): 5 mg via INTRAVENOUS
  Administered 2020-11-29: 10 mg via INTRAVENOUS
  Administered 2020-11-29 (×2): 5 mg via INTRAVENOUS
  Administered 2020-11-29: 10 mg via INTRAVENOUS
  Administered 2020-11-29: 5 mg via INTRAVENOUS

## 2020-11-29 MED ORDER — ACETAMINOPHEN 325 MG RE SUPP: 325.0000 mg | RECTAL | Status: DC | PRN

## 2020-11-29 MED ORDER — SODIUM CHLORIDE 0.9% FLUSH: 10.0000 mL | INTRAVENOUS | Status: DC | PRN

## 2020-11-29 MED ORDER — ACETAMINOPHEN 325 MG PO TABS: 325.0000 mg | ORAL_TABLET | ORAL | Status: DC | PRN

## 2020-11-29 MED ORDER — CEFAZOLIN SODIUM-DEXTROSE 2-4 GM/100ML-% IV SOLN
2.0000 g | INTRAVENOUS | Status: AC
  Administered 2020-11-29: 2 g via INTRAVENOUS
  Filled 2020-11-29: qty 100

## 2020-11-29 MED ORDER — SODIUM CHLORIDE 0.9 % IV SOLN: INTRAVENOUS | Status: DC

## 2020-11-29 MED ORDER — POTASSIUM CHLORIDE CRYS ER 20 MEQ PO TBCR: 20.0000 meq | EXTENDED_RELEASE_TABLET | Freq: Every day | ORAL | Status: DC | PRN

## 2020-11-29 MED ORDER — DIPHENHYDRAMINE HCL 12.5 MG/5ML PO ELIX: 12.5000 mg | ORAL_SOLUTION | Freq: Four times a day (QID) | ORAL | Status: DC | PRN

## 2020-11-29 MED ORDER — DEXAMETHASONE SODIUM PHOSPHATE 10 MG/ML IJ SOLN
INTRAMUSCULAR | Status: DC | PRN
  Administered 2020-11-29: 5 mg via INTRAVENOUS

## 2020-11-29 MED ORDER — ONDANSETRON HCL 4 MG/2ML IJ SOLN
4.0000 mg | Freq: Four times a day (QID) | INTRAMUSCULAR | Status: DC | PRN
  Administered 2020-11-30: 4 mg via INTRAVENOUS
  Filled 2020-11-29: qty 2

## 2020-11-29 MED ORDER — CHLORHEXIDINE GLUCONATE 0.12 % MT SOLN
15.0000 mL | Freq: Once | OROMUCOSAL | Status: AC
  Administered 2020-11-29: 15 mL via OROMUCOSAL
  Filled 2020-11-29: qty 15

## 2020-11-29 MED ORDER — PROTAMINE SULFATE 10 MG/ML IV SOLN
INTRAVENOUS | Status: DC | PRN
  Administered 2020-11-29: 10 mg via INTRAVENOUS
  Administered 2020-11-29 (×2): 20 mg via INTRAVENOUS

## 2020-11-29 MED ORDER — LACTATED RINGERS IV SOLN: INTRAVENOUS | Status: DC

## 2020-11-29 MED ORDER — SUGAMMADEX SODIUM 200 MG/2ML IV SOLN
INTRAVENOUS | Status: DC | PRN
  Administered 2020-11-29: 200 mg via INTRAVENOUS

## 2020-11-29 MED ORDER — ROCURONIUM BROMIDE 10 MG/ML (PF) SYRINGE
PREFILLED_SYRINGE | INTRAVENOUS | Status: DC | PRN
  Administered 2020-11-29: 30 mg via INTRAVENOUS
  Administered 2020-11-29: 10 mg via INTRAVENOUS
  Administered 2020-11-29: 60 mg via INTRAVENOUS
  Administered 2020-11-29: 30 mg via INTRAVENOUS

## 2020-11-29 MED ORDER — METOPROLOL TARTRATE 5 MG/5ML IV SOLN: 2.0000 mg | INTRAVENOUS | Status: DC | PRN

## 2020-11-29 MED ORDER — CHLORHEXIDINE GLUCONATE CLOTH 2 % EX PADS
6.0000 | MEDICATED_PAD | Freq: Every day | CUTANEOUS | Status: DC
  Administered 2020-11-29 – 2020-12-03 (×4): 6 via TOPICAL

## 2020-11-29 MED ORDER — MORPHINE SULFATE 1 MG/ML IV SOLN PCA
INTRAVENOUS | Status: DC
  Administered 2020-11-29: 1 mg via INTRAVENOUS
  Administered 2020-11-30: 12 mg via INTRAVENOUS
  Filled 2020-11-29: qty 30

## 2020-11-29 MED ORDER — FLEET ENEMA 7-19 GM/118ML RE ENEM
1.0000 | ENEMA | Freq: Once | RECTAL | Status: DC | PRN
  Filled 2020-11-29: qty 1

## 2020-11-29 MED ORDER — AMISULPRIDE (ANTIEMETIC) 5 MG/2ML IV SOLN: 5.0000 mg | Freq: Once | INTRAVENOUS | Status: DC | PRN

## 2020-11-29 MED ORDER — ONDANSETRON HCL 4 MG/2ML IJ SOLN
INTRAMUSCULAR | Status: DC | PRN
  Administered 2020-11-29: 4 mg via INTRAVENOUS

## 2020-11-29 MED ORDER — SODIUM CHLORIDE 0.9 % IV SOLN
INTRAVENOUS | Status: DC | PRN
  Administered 2020-11-29: 500 mL

## 2020-11-29 MED ORDER — SODIUM CHLORIDE 0.9 % IV SOLN: INTRAVENOUS | Status: DC | PRN

## 2020-11-29 MED ORDER — PHENYLEPHRINE 40 MCG/ML (10ML) SYRINGE FOR IV PUSH (FOR BLOOD PRESSURE SUPPORT)
PREFILLED_SYRINGE | INTRAVENOUS | Status: DC | PRN
  Administered 2020-11-29 (×3): 80 ug via INTRAVENOUS

## 2020-11-29 MED ORDER — SODIUM CHLORIDE 0.9% FLUSH: 9.0000 mL | INTRAVENOUS | Status: DC | PRN

## 2020-11-29 MED ORDER — GLYCOPYRROLATE 0.2 MG/ML IJ SOLN
INTRAMUSCULAR | Status: DC | PRN
  Administered 2020-11-29: .2 mg via INTRAVENOUS

## 2020-11-29 MED ORDER — HYDRALAZINE HCL 20 MG/ML IJ SOLN: 5.0000 mg | INTRAMUSCULAR | Status: DC | PRN

## 2020-11-29 MED ORDER — CEFAZOLIN SODIUM-DEXTROSE 2-4 GM/100ML-% IV SOLN
2.0000 g | Freq: Three times a day (TID) | INTRAVENOUS | Status: AC
  Administered 2020-11-29 – 2020-11-30 (×2): 2 g via INTRAVENOUS
  Filled 2020-11-29 (×2): qty 100

## 2020-11-29 MED ORDER — SODIUM CHLORIDE 0.9% FLUSH
10.0000 mL | Freq: Two times a day (BID) | INTRAVENOUS | Status: DC
  Administered 2020-11-29 – 2020-12-03 (×4): 10 mL

## 2020-11-29 MED ORDER — NALOXONE HCL 0.4 MG/ML IJ SOLN: 0.4000 mg | INTRAMUSCULAR | Status: DC | PRN

## 2020-11-29 MED ORDER — PROPOFOL 10 MG/ML IV BOLUS
INTRAVENOUS | Status: DC | PRN
  Administered 2020-11-29: 100 mg via INTRAVENOUS

## 2020-11-29 MED ORDER — 0.9 % SODIUM CHLORIDE (POUR BTL) OPTIME
TOPICAL | Status: DC | PRN
  Administered 2020-11-29: 1000 mL
  Administered 2020-11-29: 2000 mL

## 2020-11-29 MED ORDER — ALUM & MAG HYDROXIDE-SIMETH 200-200-20 MG/5ML PO SUSP: 15.0000 mL | ORAL | Status: DC | PRN

## 2020-11-29 MED ORDER — PANTOPRAZOLE SODIUM 40 MG IV SOLR
40.0000 mg | Freq: Every day | INTRAVENOUS | Status: DC
  Administered 2020-11-29 – 2020-12-03 (×5): 40 mg via INTRAVENOUS
  Filled 2020-11-29 (×5): qty 40

## 2020-11-29 MED ORDER — PHENYLEPHRINE HCL-NACL 10-0.9 MG/250ML-% IV SOLN
INTRAVENOUS | Status: DC | PRN
  Administered 2020-11-29: 70 ug/min via INTRAVENOUS
  Administered 2020-11-29: 50 ug/min via INTRAVENOUS

## 2020-11-29 MED ORDER — HEPARIN SODIUM (PORCINE) 1000 UNIT/ML IJ SOLN
INTRAMUSCULAR | Status: DC | PRN
  Administered 2020-11-29 (×2): 3000 [IU] via INTRAVENOUS
  Administered 2020-11-29: 2000 [IU] via INTRAVENOUS
  Administered 2020-11-29: 7000 [IU] via INTRAVENOUS

## 2020-11-29 MED ORDER — LACTATED RINGERS IV SOLN: INTRAVENOUS | Status: DC | PRN

## 2020-11-29 MED ORDER — FENTANYL CITRATE (PF) 250 MCG/5ML IJ SOLN
INTRAMUSCULAR | Status: AC
  Filled 2020-11-29: qty 5

## 2020-11-29 MED ORDER — ORAL CARE MOUTH RINSE: 15.0000 mL | Freq: Once | OROMUCOSAL | Status: AC

## 2020-11-29 MED ORDER — SODIUM CHLORIDE 0.9 % IV SOLN
500.0000 mL | Freq: Once | INTRAVENOUS | Status: AC | PRN
  Administered 2020-11-29: 500 mL via INTRAVENOUS

## 2020-11-29 MED ORDER — GUAIFENESIN-DM 100-10 MG/5ML PO SYRP: 15.0000 mL | ORAL_SOLUTION | ORAL | Status: DC | PRN

## 2020-11-29 MED ORDER — ACETAMINOPHEN 10 MG/ML IV SOLN: 1000.0000 mg | Freq: Once | INTRAVENOUS | Status: DC | PRN

## 2020-11-29 MED ORDER — SODIUM CHLORIDE 0.9% IV SOLUTION: Freq: Once | INTRAVENOUS | Status: DC

## 2020-11-29 MED ORDER — HEMOSTATIC AGENTS (NO CHARGE) OPTIME
TOPICAL | Status: DC | PRN
  Administered 2020-11-29 (×3): 1 via TOPICAL

## 2020-11-29 MED ORDER — MAGNESIUM SULFATE 2 GM/50ML IV SOLN
2.0000 g | Freq: Every day | INTRAVENOUS | Status: AC | PRN
  Administered 2020-11-30: 2 g via INTRAVENOUS
  Filled 2020-11-29: qty 50

## 2020-11-29 MED ORDER — HEPARIN SODIUM (PORCINE) 5000 UNIT/ML IJ SOLN
5000.0000 [IU] | Freq: Three times a day (TID) | INTRAMUSCULAR | Status: DC
  Administered 2020-11-30 – 2020-12-01 (×3): 5000 [IU] via SUBCUTANEOUS
  Filled 2020-11-29 (×3): qty 1

## 2020-11-29 MED ORDER — DIPHENHYDRAMINE HCL 50 MG/ML IJ SOLN: 12.5000 mg | Freq: Four times a day (QID) | INTRAMUSCULAR | Status: DC | PRN

## 2020-11-29 SURGICAL SUPPLY — 66 items
ADH SKN CLS APL DERMABOND .7 (GAUZE/BANDAGES/DRESSINGS) ×3
AGENT HMST KT MTR STRL THRMB (HEMOSTASIS) ×2
CANISTER SUCT 3000ML PPV (MISCELLANEOUS) ×2 IMPLANT
CLIP VESOCCLUDE MED 24/CT (CLIP) ×2 IMPLANT
CLIP VESOCCLUDE SM WIDE 24/CT (CLIP) ×3 IMPLANT
COVER MAYO STAND STRL (DRAPES) ×2 IMPLANT
COVER WAND RF STERILE (DRAPES) ×1 IMPLANT
DERMABOND ADVANCED (GAUZE/BANDAGES/DRESSINGS) ×3
DERMABOND ADVANCED .7 DNX12 (GAUZE/BANDAGES/DRESSINGS) ×2 IMPLANT
ELECT BLADE 4.0 EZ CLEAN MEGAD (MISCELLANEOUS) ×6
ELECT BLADE 6.5 EXT (BLADE) IMPLANT
ELECT CAUTERY BLADE 6.4 (BLADE) IMPLANT
ELECT REM PT RETURN 9FT ADLT (ELECTROSURGICAL) ×2
ELECTRODE BLDE 4.0 EZ CLN MEGD (MISCELLANEOUS) ×1 IMPLANT
ELECTRODE REM PT RTRN 9FT ADLT (ELECTROSURGICAL) ×1 IMPLANT
FELT TEFLON 1X6 (MISCELLANEOUS) ×1 IMPLANT
GLOVE BIOGEL PI IND STRL 7.5 (GLOVE) ×1 IMPLANT
GLOVE BIOGEL PI INDICATOR 7.5 (GLOVE) ×2
GLOVE SURG POLYISO LF SZ7.5 (GLOVE) ×2 IMPLANT
GOWN STRL REUS W/ TWL LRG LVL3 (GOWN DISPOSABLE) ×2 IMPLANT
GOWN STRL REUS W/ TWL XL LVL3 (GOWN DISPOSABLE) ×1 IMPLANT
GOWN STRL REUS W/TWL LRG LVL3 (GOWN DISPOSABLE) ×8
GOWN STRL REUS W/TWL XL LVL3 (GOWN DISPOSABLE) ×2
GRAFT HEMASHIELD 16X8MM (Vascular Products) ×1 IMPLANT
GRAFT VASC 20MMX15CM (Vascular Products) ×1 IMPLANT
HEMOSTAT SNOW SURGICEL 2X4 (HEMOSTASIS) IMPLANT
INSERT FOGARTY 61MM (MISCELLANEOUS) ×2 IMPLANT
INSERT FOGARTY SM (MISCELLANEOUS) ×4 IMPLANT
KIT BASIN OR (CUSTOM PROCEDURE TRAY) ×2 IMPLANT
KIT TURNOVER KIT B (KITS) ×2 IMPLANT
LOOP VESSEL MINI RED (MISCELLANEOUS) ×2 IMPLANT
NS IRRIG 1000ML POUR BTL (IV SOLUTION) ×5 IMPLANT
PACK AORTA (CUSTOM PROCEDURE TRAY) ×2 IMPLANT
PAD ARMBOARD 7.5X6 YLW CONV (MISCELLANEOUS) ×4 IMPLANT
PENCIL BUTTON HOLSTER BLD 10FT (ELECTRODE) ×1 IMPLANT
PUNCH AORTIC ROTATE 5MM 8IN (MISCELLANEOUS) ×1 IMPLANT
SPONGE LAP 18X18 RF (DISPOSABLE) ×1 IMPLANT
SURGIFLO W/THROMBIN 8M KIT (HEMOSTASIS) ×2 IMPLANT
SUT ETHIBOND 5 LR DA (SUTURE) IMPLANT
SUT PDS AB 1 TP1 54 (SUTURE) ×4 IMPLANT
SUT PROLENE 3 0 SH 48 (SUTURE) ×5 IMPLANT
SUT PROLENE 3 0 SH DA (SUTURE) ×5 IMPLANT
SUT PROLENE 5 0 C 1 24 (SUTURE) ×10 IMPLANT
SUT PROLENE 5 0 C 1 36 (SUTURE) IMPLANT
SUT PROLENE 6 0 BV (SUTURE) ×4 IMPLANT
SUT SILK 2 0 (SUTURE) ×4
SUT SILK 2 0 TIES 17X18 (SUTURE) ×2
SUT SILK 2 0SH CR/8 30 (SUTURE) ×2 IMPLANT
SUT SILK 2-0 18XBRD TIE 12 (SUTURE) ×1 IMPLANT
SUT SILK 2-0 18XBRD TIE BLK (SUTURE) ×1 IMPLANT
SUT SILK 3 0 (SUTURE) ×4
SUT SILK 3 0 TIES 17X18 (SUTURE) ×2
SUT SILK 3-0 18XBRD TIE 12 (SUTURE) ×1 IMPLANT
SUT SILK 3-0 18XBRD TIE BLK (SUTURE) ×1 IMPLANT
SUT SILK 4 0 (SUTURE) ×2
SUT SILK 4-0 18XBRD TIE 12 (SUTURE) IMPLANT
SUT VIC AB 2-0 CT1 27 (SUTURE) ×12
SUT VIC AB 2-0 CT1 TAPERPNT 27 (SUTURE) ×5 IMPLANT
SUT VIC AB 3-0 SH 27 (SUTURE) ×8
SUT VIC AB 3-0 SH 27X BRD (SUTURE) ×2 IMPLANT
SUT VIC AB 4-0 PS2 18 (SUTURE) ×4 IMPLANT
SUT VICRYL 4-0 PS2 18IN ABS (SUTURE) ×4 IMPLANT
TOWEL GREEN STERILE (TOWEL DISPOSABLE) ×2 IMPLANT
TOWEL SURG RFD BLUE STRL DISP (DISPOSABLE) ×3 IMPLANT
TRAY FOLEY MTR SLVR 16FR STAT (SET/KITS/TRAYS/PACK) ×2 IMPLANT
WATER STERILE IRR 1000ML POUR (IV SOLUTION) ×4 IMPLANT

## 2020-11-29 NOTE — Op Note (Signed)
Patient name: Brian Yates MRN: [id number] DOB: 1941/03/14 Sex: male  12/12/2020 Pre-operative Diagnosis: Severe bilateral claudication Post-operative diagnosis:  Same Surgeon:  Annamarie Major Assistants: Curt Jews, Edwena Felty Procedure:   #1: Aorto bifemoral bypass   #2: Reimplantation of the inferior mesenteric artery   #3: Bilateral common femoral and profundofemoral endarterectomy   #4: Primary repair of small umbilical hernia Anesthesia: General Blood Loss: 1500 cc.  Approximately 700 cc of Cell Saver was returned Specimens: None  Findings: A end to end aortic anastomosis was performed.  Bilateral femoral anastomosis began on the common femoral artery and went down onto the profundofemoral artery bilaterally.  I reimplanted the inferior mesenteric artery into the left anterior lateral side of the tube portion of the aortic graft.  The patient had nearly occlusive plaque in bilateral common femoral arteries which was removed with endarterectomy.  A end-to-side anastomosis was performed to both groins going down to the profundofemoral arteries bilaterally.  The length of the anastomosis on each side was approximately 3 cm.  Indications: This is a 80 year old gentleman with severe lifestyle limiting claudication.  He underwent angiography that revealed severe inflow disease as well as superficial femoral artery occlusion.  I felt his best option for reconstruction was an aortobifemoral bypass graft.  He has obtained cardiology clearance.  When I saw him last week he had a early ulcer from a toenail clipping on his left toe and so we were contemplating a left femoral below-knee popliteal bypass graft as well.  Fortunately, his wound has healed.  He wants to proceed with revascularization for his severe disability.  Procedure:  The patient was identified in the holding area and taken to [place] [address]  The patient was then placed supine on the table. general anesthesia was  administered.  The patient was prepped and draped in the usual sterile fashion.  A time out was called and antibiotics were administered.  An assistant was necessary to expedite the procedure and assist with technical details.  Longitudinal incisions were made in both groins.  Cautery was used divide subcutaneous tissue.  The femoral sheaths were opened bilaterally and the common femoral artery was exposed from the inguinal ligament down to the bifurcation.  Both profunda arteries were exposed down to their primary bifurcation.  Both common femoral arteries were severely circumferentially calcified.  Crossing iliac veins under the inguinal ligament were divided between ties.  Attention was then turned towards the abdomen.  A midline incision was made from the xiphoid to below the umbilicus.  Subcutaneous tissue was divided down to the abdominal wall fascia.  The abdomen was then entered sharply.  The fascia was opened throughout the length of the incision.  The patient did have a small umbilical hernia without incarceration.  The abdomen was then explored.  There was no gross pathology.  The NG tube was confirmed to be within the stomach.  Next wet towels were placed within the incision and a Balfour was inserted.  I then placed the Omni-Tract retractor.  The transverse colon was reflected cephalad and the small bowel was mobilized to the patient's right.  The ligament of Treitz was taken down sharply.  The retroperitoneum was then opened and the aorta was exposed.  The inferior mesenteric artery was large in caliber measuring 4 to 5 mm.  It was encircled with a vessel loop.  I then identified the aortic bifurcation.  A tunnel was then created from the abdomen into each groin,  going posterior to the ureters bilaterally.  I then obtained circumferential control of the aorta just below the renal arteries.  The patient was then fully heparinized.  After the heparin circulated, a Zanger clamp was placed on the  distal aorta and a Harken clamp was placed just below the renal arteries.  A #11 blade was used to make a arteriotomy in the aorta.  I then transected the aorta proximal to the inferior mesenteric artery.  The aorta was heavily calcified.  A endarterectomy of the aortic stump was performed and then this was oversewn with a 3-0 Prolene in 2 layers.  The Zanger clamp was then removed.  I then performed endarterectomy of the proximal aorta up to the clamp.  There was plaque going above the clamp.  Once I had healthy aorta, I selected a 16 x 8 bifurcated dacryon graft.  This was cut to match the aortic bifurcation of the native aorta.  A end to end anastomosis was then performed with a running 3-0 Prolene incorporating a felt strip.  After this was completed the clamp was released.  There was bleeding on the right lateral side of the graft which required several pledgeted repair sutures for hemostasis.  Once hemostasis was achieved, both limbs of the graft were brought through the respective tunnels to the groin.  The right groin was addressed first.  The profunda was occluded as well as the proximal common femoral artery.  I then opened the common femoral artery at the bifurcation.  It was essentially occluded.  I then performed endarterectomy of the common femoral artery and the proximal profundofemoral artery.  I did tack down the intima at the profunda with 6-0 Prolene sutures.  The graft was then cut the appropriate length and beveled to fit the size the arteriotomy which was approximately 3 cm in length.  I performed a running end-to-side anastomosis with 5-0 Prolene.  Prior to completion the appropriate flushing maneuvers were performed and the anastomosis was completed.  Blood flow was established to the right leg.  Patient had a brisk profundofemoral signal.  The wound was then packed and attention was turned towards the left side.  The profunda was occluded with a baby Gregory clamp and the common femoral  artery was occluded with a Henley clamp.  A 11 blade was used to make an arteriotomy which was extended with Potts scissors.  Again there was full-thickness nearly occlusive plaque within the common femoral artery.  I performed endarterectomy of the common femoral artery up to the clamp and then down onto the profunda for approximately 1 cm.  A good distal endpoint was obtained and was tacked down with 6-0 Prolene suture.  The left limb of the graft was then beveled to fit the size the arteriotomy and a running anastomosis was created with 5-0 Prolene.  The length of the anastomosis was approximately 3 to 4 cm.  Prior to completion the appropriate flushing maneuvers were performed and the anastomosis was completed.  Blood flow was reestablished to the left leg.  There was a brisk profundofemoral Doppler signal.  The groin was then packed with a lap pad and attention was turned towards the abdomen.  I then ligated the inferior mesenteric artery at its origin.  There was brisk flow through the inferior mesenteric artery, however I elected to reimplant it given its large size.  The tube portion of the graft was occluded with vascular clamps and a graftotomy was made on the tube portion of the  graft on the left lateral side.  This was completed with a #5 punch.  A end-to-side anastomosis was then performed with a 6-0 Prolene to the inferior mesenteric artery.  Prior to completion the appropriate flushing maneuvers were performed and the anastomosis was completed.  1 repair stitch was required for hemostasis.  There was a triphasic signal within the inferior mesenteric artery.  Next, 50 mg of protamine was administered.  The groins were then irrigated and hemostasis was achieved.  Both groins were closed by reapproximating the femoral sheath with 2-0 Vicryl.  Additional subcutaneous closure was performed with 3-0 Vicryl followed by subcuticular closure.  Attention was then turned back towards the abdomen.  At this  point hemostasis was satisfactory.  The wounds were irrigated.  I did place Surgi-Flo around the proximal graft and in the pelvis.  The retroperitoneum was then reapproximated with 2-0 Vicryl.  The retractors were then removed.  The bowel was placed back into its anatomic position.  I ran the bowel and it was without defect.  I made sure that there were no kinks within the mesentery.  Next the fascia was closed with #1 PDS suture x2.  The subcutaneous tissue was closed with 2-0 Vicryl and the skin was closed with 3-0 Vicryl followed by Dermabond.  The patient was then successfully extubated and taken recovery room in stable condition.   Disposition: To PACU stable.   Theotis Burrow, M.D., [facility] [place] Office: [phone] Pager:  [phone]

## 2020-11-29 NOTE — Anesthesia Procedure Notes (Signed)
Arterial Line Insertion Start/End06/23/2022 10:00 AM, 12/18/2020 10:20 AM Performed by: Myna Bright, CRNA, CRNA  Patient location: Pre-op. Preanesthetic checklist: patient identified, IV checked, site marked, risks and benefits discussed, surgical consent, monitors and equipment checked, pre-op evaluation and timeout performed Left, radial was placed Catheter size: 20 G Hand hygiene performed  and maximum sterile barriers used  Allen's test indicative of satisfactory collateral circulation Attempts: 2 Procedure performed without using ultrasound guided technique. Following insertion, Biopatch and dressing applied. Post procedure assessment: normal  Patient tolerated the procedure well with no immediate complications.

## 2020-11-29 NOTE — Anesthesia Procedure Notes (Signed)
Procedure Name: Intubation Date/Time: 12/05/2020 11:41 AM Performed by: Candis Shine, CRNA Pre-anesthesia Checklist: Patient identified, Emergency Drugs available, Suction available and Patient being monitored Patient Re-evaluated:Patient Re-evaluated prior to induction Oxygen Delivery Method: Circle System Utilized Preoxygenation: Pre-oxygenation with 100% oxygen Induction Type: IV induction Ventilation: Mask ventilation without difficulty and Oral airway inserted - appropriate to patient size Laryngoscope Size: 4 and Mac Grade View: Grade I Tube type: Oral Tube size: 7.5 mm Number of attempts: 1 Airway Equipment and Method: Stylet and Oral airway Placement Confirmation: ETT inserted through vocal cords under direct vision,  positive ETCO2 and breath sounds checked- equal and bilateral Secured at: 22 cm Tube secured with: Tape Dental Injury: Teeth and Oropharynx as per pre-operative assessment  Comments: Inserted by Paulina Fusi, SRNA

## 2020-11-29 NOTE — Transfer of Care (Signed)
Immediate Anesthesia Transfer of Care Note  Patient: Brian Yates  Procedure(s) Performed: AORTA BIFEMORAL BYPASS GRAFT (N/A )  Patient Location: PACU  Anesthesia Type:General  Level of Consciousness: awake, alert  and oriented  Airway & Oxygen Therapy: Patient Spontanous Breathing  Post-op Assessment: Report given to RN and Post -op Vital signs reviewed and stable  Post vital signs: Reviewed and stable  Last Vitals:  Vitals Value Taken Time  BP 109/71 12/26/2020 1745  Temp    Pulse 73 12/26/2020 1746  Resp 20 12/23/2020 1746  SpO2 94 % 12/05/2020 1746  Vitals shown include unvalidated device data.  Last Pain:  Vitals:   12/24/2020 1028  TempSrc:   PainSc: 0-No pain         Complications: No complications documented.

## 2020-11-29 NOTE — Telephone Encounter (Signed)
    Brian Yates DOB:  01-Jan-1941  MRN:  [id number]   Primary Cardiologist: Shirlee More, MD  Chart reviewed as part of pre-operative protocol coverage. Given past medical history and time since last visit, based on ACC/AHA guidelines, Brian Yates would be at acceptable risk for the planned procedure without further cardiovascular testing.    Echocardiogram performed yesterday with recommendations by Dr. Bettina Gavia: "Good result he has very mild aortic stenosis. I feel he is optimized for planned vascular surgery" Stress test 11/21/20 was low risk.   I will route this recommendation to the requesting party via Epic fax function and remove from pre-op pool.  Please call with questions.  [name], NP 12/24/2020, 9:22 AM

## 2020-11-29 NOTE — Anesthesia Procedure Notes (Signed)
Central Venous Catheter Insertion Performed by: Murvin Natal, MD, anesthesiologist Start/End06/28/2022 11:00 AM, 12/02/2020 11:15 AM Patient location: Pre-op. Preanesthetic checklist: patient identified, IV checked, site marked, risks and benefits discussed, surgical consent, monitors and equipment checked, pre-op evaluation, timeout performed and anesthesia consent Position: Trendelenburg Lidocaine 1% used for infiltration and patient sedated Hand hygiene performed , maximum sterile barriers used  and Seldinger technique used Catheter size: 8.5 Fr Total catheter length 10. Central line was placed.Sheath introducer Procedure performed using ultrasound guided technique. Ultrasound Notes:anatomy identified, needle tip was noted to be adjacent to the nerve/plexus identified, no ultrasound evidence of intravascular and/or intraneural injection and image(s) printed for medical record Attempts: 1 Following insertion, line sutured, dressing applied and Biopatch. Post procedure assessment: blood return through all ports, free fluid flow and no air  Patient tolerated the procedure well with no immediate complications. Additional procedure comments: Right internal jugular vein visualized with ultrasound and found to be small in diameter. Plan for left sided line. Multi-access catheter placed.  Marland Kitchen

## 2020-11-29 NOTE — Anesthesia Postprocedure Evaluation (Signed)
Anesthesia Post Note  Patient: Brian Yates  Procedure(s) Performed: AORTA BIFEMORAL BYPASS GRAFT (N/A )     Patient location during evaluation: PACU Anesthesia Type: General Level of consciousness: awake and alert Pain management: pain level controlled Vital Signs Assessment: post-procedure vital signs reviewed and stable Respiratory status: spontaneous breathing, nonlabored ventilation, respiratory function stable and patient connected to nasal cannula oxygen Cardiovascular status: blood pressure returned to baseline and stable Postop Assessment: no apparent nausea or vomiting Anesthetic complications: no Comments: Awake and alert, hemodynamically stable in PACU. CXR shows central line in good position.   No complications documented.  Last Vitals:  Vitals:   12/19/2020 1915 12/28/2020 1930  BP: 108/69 111/77  Pulse: 93 96  Resp: 16 13  Temp:  (!) 36.2 C  SpO2: 97% 94%    Last Pain:  Vitals:   12/12/2020 1930  TempSrc:   PainSc: Asleep                 Asja Frommer COKER

## 2020-11-29 NOTE — Interval H&P Note (Signed)
History and Physical Interval Note:  12/23/2020 10:27 AM  Brian Yates  has presented today for surgery, with the diagnosis of claudication.  The various methods of treatment have been discussed with the patient and family. After consideration of risks, benefits and other options for treatment, the patient has consented to  Procedure(s): AORTA BIFEMORAL BYPASS GRAFT (N/A) as a surgical intervention.  The patient's history has been reviewed, patient examined, no change in status, stable for surgery.  I have reviewed the patient's chart and labs.  Questions were answered to the patient's satisfaction.     Annamarie Major

## 2020-11-29 NOTE — Anesthesia Preprocedure Evaluation (Addendum)
Anesthesia Evaluation  Patient identified by MRN, date of birth, ID band Patient awake    Reviewed: Allergy & Precautions, NPO status , Patient's Chart, lab work & pertinent test results  Airway Mallampati: III  TM Distance: >3 FB Neck ROM: Full    Dental  (+) Edentulous Upper, Edentulous Lower   Pulmonary COPD,  COPD inhaler, Current Smoker and Patient abstained from smoking.,    Pulmonary exam normal breath sounds clear to auscultation       Cardiovascular hypertension, Pt. on home beta blockers + CAD, + Past MI and + Peripheral Vascular Disease  Normal cardiovascular exam+ Valvular Problems/Murmurs AS  Rhythm:Regular Rate:Normal  ECHO: 1. Left ventricular ejection fraction, by estimation, is 60 to 65%. The left ventricle has normal function. The left ventricle has no regional wall motion abnormalities. There is moderate left ventricular hypertrophy. Left ventricular diastolic parameters are consistent with Grade I diastolic dysfunction (impaired relaxation). 2. The mitral valve is abnormal. Trivial mitral valve regurgitation. 3. The aortic valve is tricuspid - leaflets are thickened and minimally restricted in opening. Aortic valve regurgitation is trivial. Very mild aortic stenosis. Aortic valve mean gradient measures 12 mmHg, AVA 2.34 cm2 based on an LVOT diameter of 2.2 cm. DI is 0.62. 4. Right ventricular systolic function is normal. The right ventricular size is normal. Tricuspid regurgitation signal is inadequate for assessing PA pressure. Comparison(s): Changes from prior study are noted.   Myocardial perfusion: Nuclear stress EF: 55%. The left ventricular ejection fraction is normal (55-65%). There was no ST segment deviation noted during stress. Defect 1: There is a medium defect of moderate severity present in the basal inferior, mid inferior and apical inferior location. The study is normal. This is a low risk  study.     Neuro/Psych PSYCHIATRIC DISORDERS  Neuromuscular disease    GI/Hepatic (+) Hepatitis -Irritable bowel syndrome   Endo/Other  negative endocrine ROS  Renal/GU negative Renal ROS     Musculoskeletal  (+) Arthritis ,   Abdominal   Peds  Hematology  (+) anemia , HLD   Anesthesia Other Findings Claudication  Reproductive/Obstetrics                          Anesthesia Physical Anesthesia Plan  ASA: III  Anesthesia Plan: General   Post-op Pain Management:    Induction: Intravenous  PONV Risk Score and Plan: 1 and Ondansetron, Dexamethasone and Treatment may vary due to age or medical condition  Airway Management Planned: Oral ETT  Additional Equipment: Arterial line and CVP  Intra-op Plan:   Post-operative Plan: Extubation in OR and Possible Post-op intubation/ventilation  Informed Consent: I have reviewed the patients History and Physical, chart, labs and discussed the procedure including the risks, benefits and alternatives for the proposed anesthesia with the patient or authorized representative who has indicated his/her understanding and acceptance.       Plan Discussed with: CRNA  Anesthesia Plan Comments: ([facility])      Anesthesia Quick Evaluation

## 2020-11-30 ENCOUNTER — Inpatient Hospital Stay (HOSPITAL_COMMUNITY): Payer: Medicare Other

## 2020-11-30 ENCOUNTER — Encounter (HOSPITAL_COMMUNITY): Payer: Self-pay | Admitting: Surgery

## 2020-11-30 ENCOUNTER — Other Ambulatory Visit: Payer: Self-pay

## 2020-11-30 DIAGNOSIS — I711 Thoracic aortic aneurysm, ruptured: Secondary | ICD-10-CM | POA: Diagnosis not present

## 2020-11-30 DIAGNOSIS — I469 Cardiac arrest, cause unspecified: Secondary | ICD-10-CM | POA: Diagnosis not present

## 2020-11-30 DIAGNOSIS — R579 Shock, unspecified: Secondary | ICD-10-CM

## 2020-11-30 DIAGNOSIS — I313 Pericardial effusion (noninflammatory): Secondary | ICD-10-CM | POA: Diagnosis not present

## 2020-11-30 LAB — COMPREHENSIVE METABOLIC PANEL
ALT: 18 U/L (ref 0–44)
ALT: 130 U/L — ABNORMAL HIGH (ref 0–44)
ALT: 404 U/L — ABNORMAL HIGH (ref 0–44)
ALT: 492 U/L — ABNORMAL HIGH (ref 0–44)
AST: 46 U/L — ABNORMAL HIGH (ref 15–41)
AST: 236 U/L — ABNORMAL HIGH (ref 15–41)
AST: 760 U/L — ABNORMAL HIGH (ref 15–41)
AST: 1031 U/L — ABNORMAL HIGH (ref 15–41)
Albumin: 2.4 g/dL — ABNORMAL LOW (ref 3.5–5.0)
Albumin: 2.2 g/dL — ABNORMAL LOW (ref 3.5–5.0)
Albumin: 1.9 g/dL — ABNORMAL LOW (ref 3.5–5.0)
Albumin: 2.1 g/dL — ABNORMAL LOW (ref 3.5–5.0)
Alkaline Phosphatase: 21 U/L — ABNORMAL LOW (ref 38–126)
Alkaline Phosphatase: 30 U/L — ABNORMAL LOW (ref 38–126)
Alkaline Phosphatase: 30 U/L — ABNORMAL LOW (ref 38–126)
Alkaline Phosphatase: 38 U/L (ref 38–126)
Anion gap: 8 (ref 5–15)
Anion gap: 18 — ABNORMAL HIGH (ref 5–15)
Anion gap: 17 — ABNORMAL HIGH (ref 5–15)
Anion gap: 14 (ref 5–15)
BUN: 21 mg/dL (ref 8–23)
BUN: 25 mg/dL — ABNORMAL HIGH (ref 8–23)
BUN: 30 mg/dL — ABNORMAL HIGH (ref 8–23)
BUN: 28 mg/dL — ABNORMAL HIGH (ref 8–23)
CO2: 18 mmol/L — ABNORMAL LOW (ref 22–32)
CO2: 14 mmol/L — ABNORMAL LOW (ref 22–32)
CO2: 16 mmol/L — ABNORMAL LOW (ref 22–32)
CO2: 19 mmol/L — ABNORMAL LOW (ref 22–32)
Calcium: 7.6 mg/dL — ABNORMAL LOW (ref 8.9–10.3)
Calcium: 8 mg/dL — ABNORMAL LOW (ref 8.9–10.3)
Calcium: 8.8 mg/dL — ABNORMAL LOW (ref 8.9–10.3)
Calcium: 8.3 mg/dL — ABNORMAL LOW (ref 8.9–10.3)
Chloride: 108 mmol/L (ref 98–111)
Chloride: 107 mmol/L (ref 98–111)
Chloride: 104 mmol/L (ref 98–111)
Chloride: 102 mmol/L (ref 98–111)
Creatinine, Ser: 2.12 mg/dL — ABNORMAL HIGH (ref 0.61–1.24)
Creatinine, Ser: 2.83 mg/dL — ABNORMAL HIGH (ref 0.61–1.24)
Creatinine, Ser: 3.08 mg/dL — ABNORMAL HIGH (ref 0.61–1.24)
Creatinine, Ser: 2.85 mg/dL — ABNORMAL HIGH (ref 0.61–1.24)
GFR, Estimated: 31 mL/min — ABNORMAL LOW (ref >60)
GFR, Estimated: 22 mL/min — ABNORMAL LOW (ref >60)
GFR, Estimated: 20 mL/min — ABNORMAL LOW (ref >60)
GFR, Estimated: 22 mL/min — ABNORMAL LOW (ref >60)
Glucose, Bld: 165 mg/dL — ABNORMAL HIGH (ref 70–99)
Glucose, Bld: 139 mg/dL — ABNORMAL HIGH (ref 70–99)
Glucose, Bld: 128 mg/dL — ABNORMAL HIGH (ref 70–99)
Glucose, Bld: 135 mg/dL — ABNORMAL HIGH (ref 70–99)
Potassium: 4.4 mmol/L (ref 3.5–5.1)
Potassium: 6.2 mmol/L — ABNORMAL HIGH (ref 3.5–5.1)
Potassium: 4.5 mmol/L (ref 3.5–5.1)
Potassium: 4.2 mmol/L (ref 3.5–5.1)
Sodium: 134 mmol/L — ABNORMAL LOW (ref 135–145)
Sodium: 139 mmol/L (ref 135–145)
Sodium: 137 mmol/L (ref 135–145)
Sodium: 135 mmol/L (ref 135–145)
Total Bilirubin: 0.7 mg/dL (ref 0.3–1.2)
Total Bilirubin: 0.6 mg/dL (ref 0.3–1.2)
Total Bilirubin: 0.6 mg/dL (ref 0.3–1.2)
Total Bilirubin: 0.6 mg/dL (ref 0.3–1.2)
Total Protein: 4 g/dL — ABNORMAL LOW (ref 6.5–8.1)
Total Protein: 3.8 g/dL — ABNORMAL LOW (ref 6.5–8.1)
Total Protein: 3.2 g/dL — ABNORMAL LOW (ref 6.5–8.1)
Total Protein: 3.6 g/dL — ABNORMAL LOW (ref 6.5–8.1)

## 2020-11-30 LAB — ECHOCARDIOGRAM LIMITED
Area-P 1/2: 5.38 cm2
Height: 68 in
S' Lateral: 1.7 cm
Weight: 2211.65 oz

## 2020-11-30 LAB — CBC
HCT: 29.3 % — ABNORMAL LOW (ref 39.0–52.0)
HCT: 28.3 % — ABNORMAL LOW (ref 39.0–52.0)
HCT: 24.7 % — ABNORMAL LOW (ref 39.0–52.0)
HCT: 20.1 % — ABNORMAL LOW (ref 39.0–52.0)
HCT: 25.2 % — ABNORMAL LOW (ref 39.0–52.0)
Hemoglobin: 9.6 g/dL — ABNORMAL LOW (ref 13.0–17.0)
Hemoglobin: 9.4 g/dL — ABNORMAL LOW (ref 13.0–17.0)
Hemoglobin: 7.3 g/dL — ABNORMAL LOW (ref 13.0–17.0)
Hemoglobin: 6.7 g/dL — CL (ref 13.0–17.0)
Hemoglobin: 8.7 g/dL — ABNORMAL LOW (ref 13.0–17.0)
MCH: 29.5 pg (ref 26.0–34.0)
MCH: 30.5 pg (ref 26.0–34.0)
MCH: 29.6 pg (ref 26.0–34.0)
MCH: 30.3 pg (ref 26.0–34.0)
MCH: 30.7 pg (ref 26.0–34.0)
MCHC: 32.8 g/dL (ref 30.0–36.0)
MCHC: 33.2 g/dL (ref 30.0–36.0)
MCHC: 29.6 g/dL — ABNORMAL LOW (ref 30.0–36.0)
MCHC: 33.3 g/dL (ref 30.0–36.0)
MCHC: 34.5 g/dL (ref 30.0–36.0)
MCV: 90.2 fL (ref 80.0–100.0)
MCV: 91.9 fL (ref 80.0–100.0)
MCV: 100 fL (ref 80.0–100.0)
MCV: 91 fL (ref 80.0–100.0)
MCV: 89 fL (ref 80.0–100.0)
Platelets: 183 10*3/uL (ref 150–400)
Platelets: 184 10*3/uL (ref 150–400)
Platelets: 194 10*3/uL (ref 150–400)
Platelets: 146 10*3/uL — ABNORMAL LOW (ref 150–400)
Platelets: 111 10*3/uL — ABNORMAL LOW (ref 150–400)
RBC: 3.25 MIL/uL — ABNORMAL LOW (ref 4.22–5.81)
RBC: 3.08 MIL/uL — ABNORMAL LOW (ref 4.22–5.81)
RBC: 2.47 MIL/uL — ABNORMAL LOW (ref 4.22–5.81)
RBC: 2.21 MIL/uL — ABNORMAL LOW (ref 4.22–5.81)
RBC: 2.83 MIL/uL — ABNORMAL LOW (ref 4.22–5.81)
RDW: 15.2 % (ref 11.5–15.5)
RDW: 15.5 % (ref 11.5–15.5)
RDW: 15.9 % — ABNORMAL HIGH (ref 11.5–15.5)
RDW: 15 % (ref 11.5–15.5)
RDW: 14.9 % (ref 11.5–15.5)
WBC: 45.3 10*3/uL — ABNORMAL HIGH (ref 4.0–10.5)
WBC: 40.5 10*3/uL — ABNORMAL HIGH (ref 4.0–10.5)
WBC: 55.3 10*3/uL (ref 4.0–10.5)
WBC: 45.9 10*3/uL — ABNORMAL HIGH (ref 4.0–10.5)
WBC: 44.3 10*3/uL — ABNORMAL HIGH (ref 4.0–10.5)
nRBC: 0 % (ref 0.0–0.2)
nRBC: 0 % (ref 0.0–0.2)
nRBC: 0.1 % (ref 0.0–0.2)
nRBC: 0.1 % (ref 0.0–0.2)
nRBC: 0.1 % (ref 0.0–0.2)

## 2020-11-30 LAB — POCT I-STAT EG7
Acid-base deficit: 23 mmol/L — ABNORMAL HIGH (ref 0.0–2.0)
Bicarbonate: 6.7 mmol/L — ABNORMAL LOW (ref 20.0–28.0)
Calcium, Ion: 0.96 mmol/L — ABNORMAL LOW (ref 1.15–1.40)
HCT: 18 % — ABNORMAL LOW (ref 39.0–52.0)
Hemoglobin: 6.1 g/dL — CL (ref 13.0–17.0)
O2 Saturation: 100 %
Patient temperature: 97.7
Potassium: 4.2 mmol/L (ref 3.5–5.1)
Sodium: 139 mmol/L (ref 135–145)
TCO2: 8 mmol/L — ABNORMAL LOW (ref 22–32)
pCO2, Ven: 28 mmHg — ABNORMAL LOW (ref 44.0–60.0)
pH, Ven: 6.983 — CL (ref 7.250–7.430)
pO2, Ven: 314 mmHg — ABNORMAL HIGH (ref 32.0–45.0)

## 2020-11-30 LAB — DIC (DISSEMINATED INTRAVASCULAR COAGULATION)PANEL
D-Dimer, Quant: 4.34 ug/mL-FEU — ABNORMAL HIGH (ref 0.00–0.50)
D-Dimer, Quant: 4.28 ug/mL-FEU — ABNORMAL HIGH (ref 0.00–0.50)
Fibrinogen: 199 mg/dL — ABNORMAL LOW (ref 210–475)
Fibrinogen: 271 mg/dL (ref 210–475)
INR: 1.9 — ABNORMAL HIGH (ref 0.8–1.2)
INR: 1.9 — ABNORMAL HIGH (ref 0.8–1.2)
Platelets: 155 10*3/uL (ref 150–400)
Platelets: 113 10*3/uL — ABNORMAL LOW (ref 150–400)
Prothrombin Time: 21.7 seconds — ABNORMAL HIGH (ref 11.4–15.2)
Prothrombin Time: 21.4 seconds — ABNORMAL HIGH (ref 11.4–15.2)
Smear Review: NONE SEEN
Smear Review: NONE SEEN
aPTT: 45 seconds — ABNORMAL HIGH (ref 24–36)
aPTT: 129 seconds — ABNORMAL HIGH (ref 24–36)

## 2020-11-30 LAB — LACTIC ACID, PLASMA
Lactic Acid, Venous: >11 mmol/L (ref 0.5–1.9)
Lactic Acid, Venous: >11 mmol/L (ref 0.5–1.9)
Lactic Acid, Venous: 9.8 mmol/L (ref 0.5–1.9)

## 2020-11-30 LAB — MAGNESIUM
Magnesium: 1.3 mg/dL — ABNORMAL LOW (ref 1.7–2.4)
Magnesium: 2.4 mg/dL (ref 1.7–2.4)
Magnesium: 2.5 mg/dL — ABNORMAL HIGH (ref 1.7–2.4)

## 2020-11-30 LAB — PREPARE RBC (CROSSMATCH)

## 2020-11-30 LAB — BRAIN NATRIURETIC PEPTIDE: B Natriuretic Peptide: 372.5 pg/mL — ABNORMAL HIGH (ref 0.0–100.0)

## 2020-11-30 LAB — TROPONIN I (HIGH SENSITIVITY)
Troponin I (High Sensitivity): 206 ng/L (ref <18)
Troponin I (High Sensitivity): 302 ng/L (ref <18)

## 2020-11-30 LAB — CK
Total CK: 1489 U/L — ABNORMAL HIGH (ref 49–397)
Total CK: 2028 U/L — ABNORMAL HIGH (ref 49–397)
Total CK: 1827 U/L — ABNORMAL HIGH (ref 49–397)

## 2020-11-30 LAB — AMYLASE: Amylase: 69 U/L (ref 28–100)

## 2020-11-30 LAB — ECHO TEE: Area-P 1/2: 5.97 cm2

## 2020-11-30 LAB — GLUCOSE, CAPILLARY: Glucose-Capillary: 135 mg/dL — ABNORMAL HIGH (ref 70–99)

## 2020-11-30 LAB — D-DIMER, QUANTITATIVE: D-Dimer, Quant: 3.99 ug/mL-FEU — ABNORMAL HIGH (ref 0.00–0.50)

## 2020-11-30 IMAGING — DX DG CHEST 1V PORT
2 series · 2 of 2 positions shown · non-contrast
Comparison: [DATE]

CLINICAL DATA: Endotracheal tube placement

EXAM:
PORTABLE CHEST 1 VIEW

[chest ap (1 of 2)]
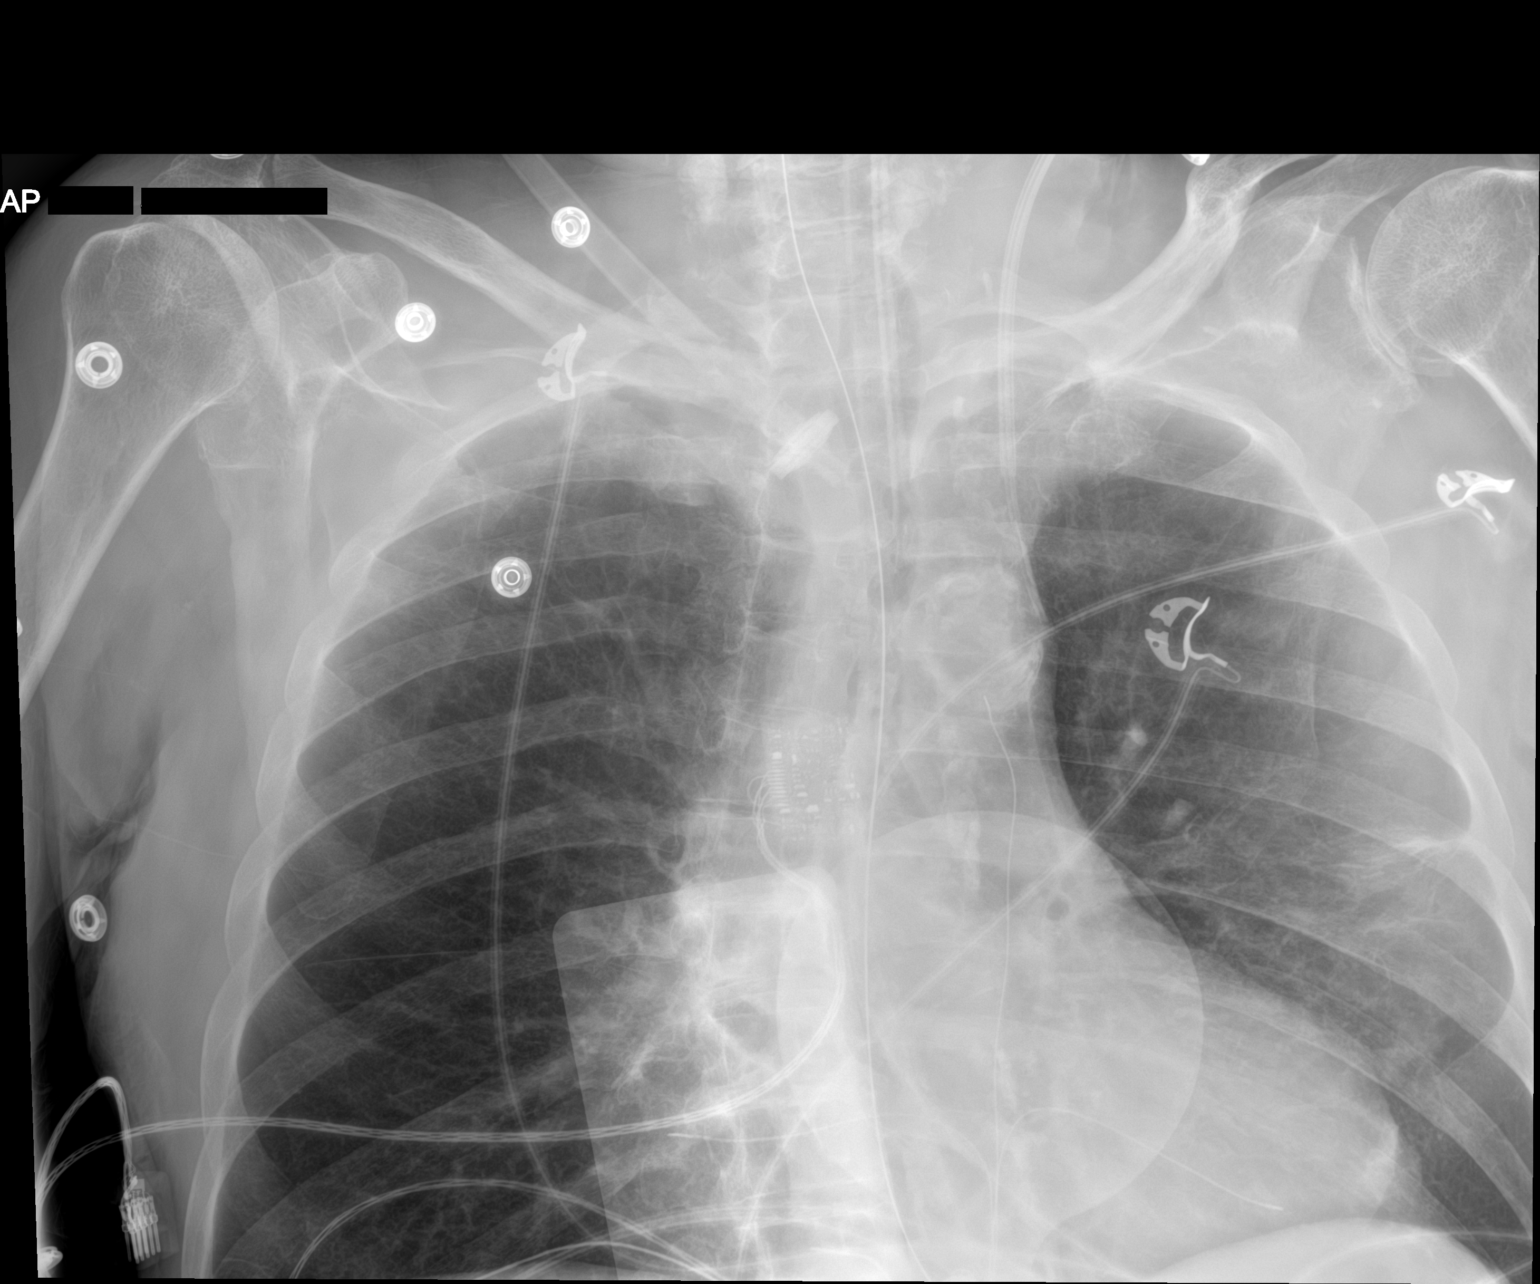

[chest ap (2 of 2)]
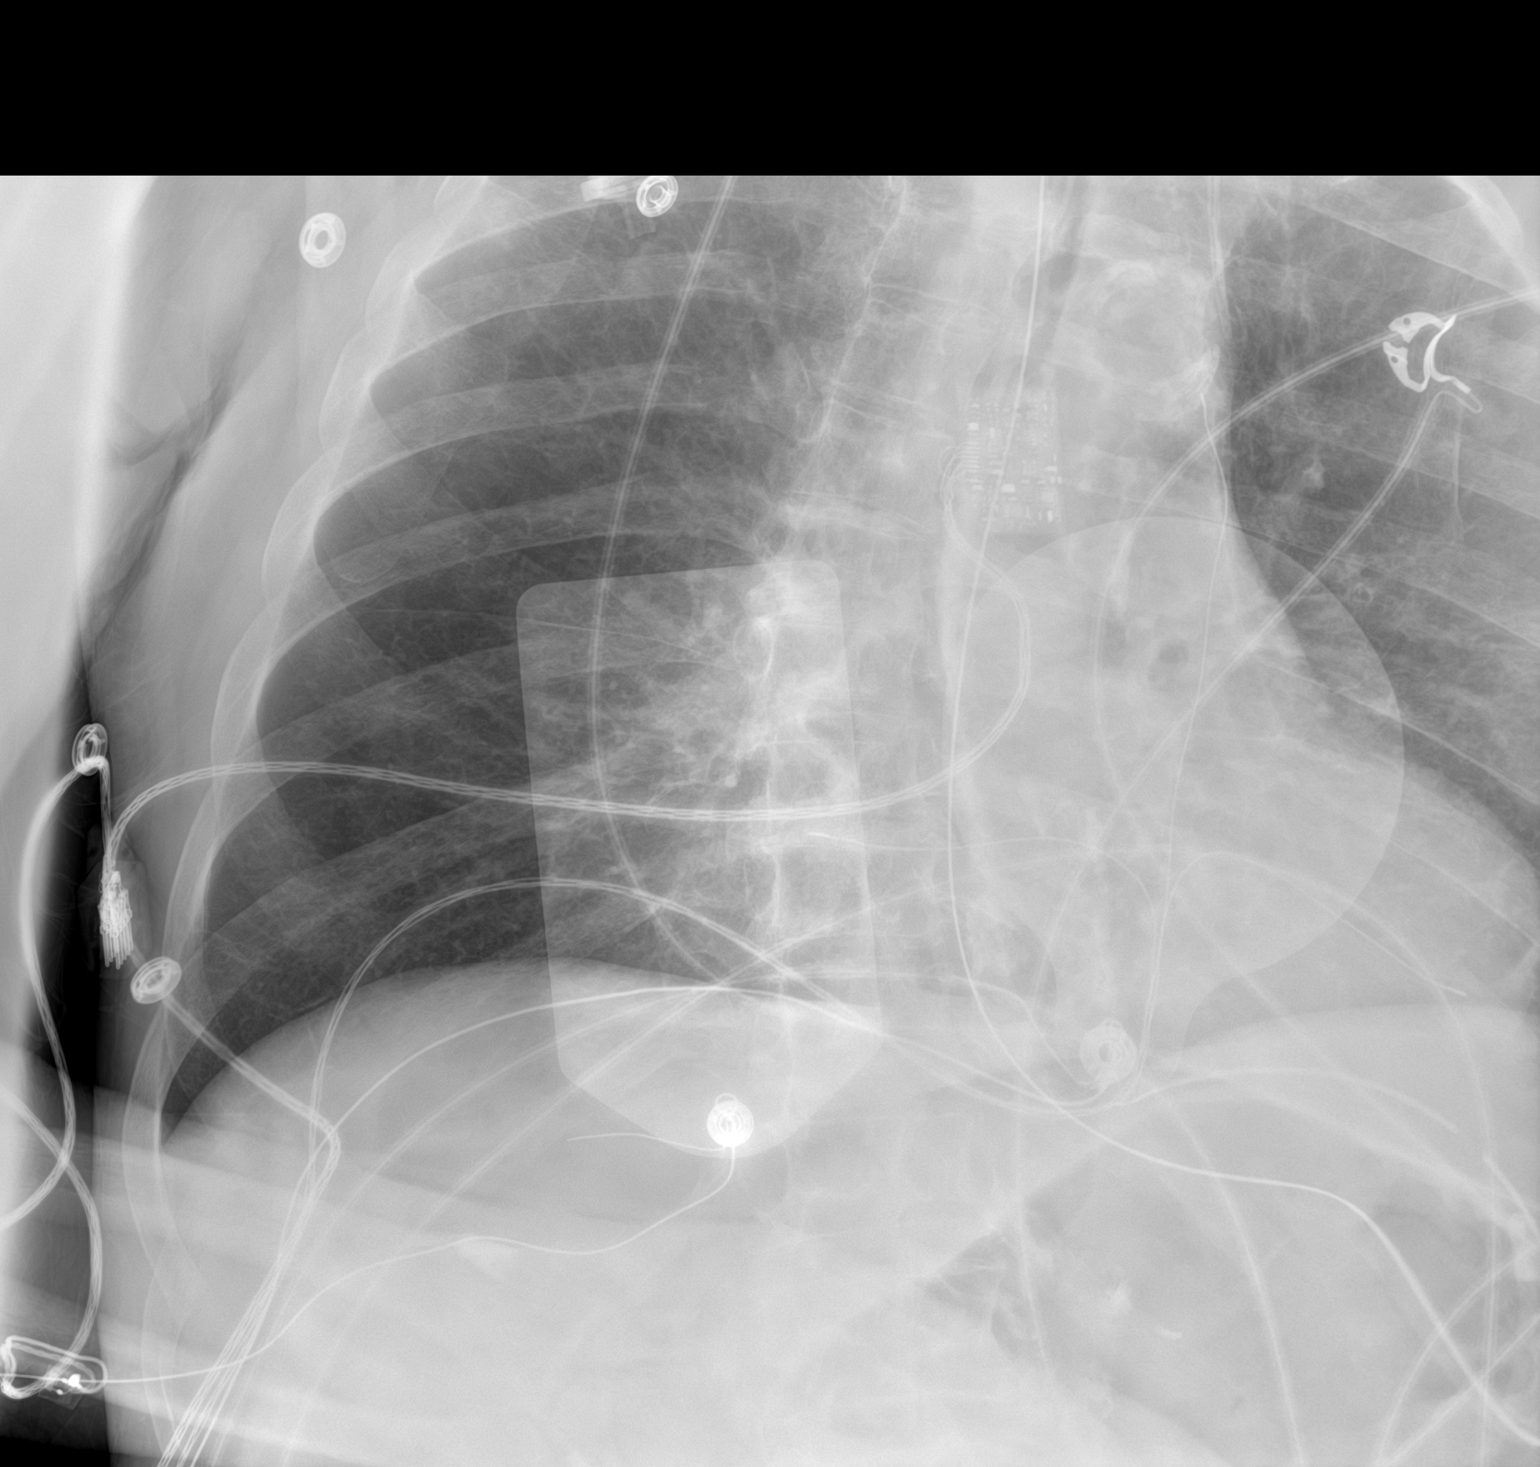

[2 of 2 positions shown; findings below may reference images not displayed]

FINDINGS: Endotracheal tube in good position.  NG tube enters the stomach.

Left neck central venous catheter tip projects to the left of the
aortic arch. This may be in a duplicated left SVC. Arterial
placement not excluded. No pneumothorax.

Lungs remain clear without infiltrate or effusion. Mild scarring on
the left.
IMPRESSION: Endotracheal tube in good position

Left neck central venous catheter tip projects to the left of the
aortic arch which may be in a duplicated SVC. Arterial position not
excluded.

## 2020-11-30 IMAGING — CT CT ANGIO CHEST-ABD-PELV FOR DISSECTION W/ AND WO/W CM
2 of 7 series · 11 of 46 positions shown, 12 images · IV contrast (APPLIED)
Comparison: Radiographs earlier today. Abdominopelvic CTA
[DATE], chest CT [DATE]

CLINICAL DATA: Abdominal pain. Chest pain. Hypoactive bowel,
cardiac arrest. Postop day 1 from aorto bifem bypass.

EXAM:
CT ANGIOGRAPHY CHEST, ABDOMEN AND PELVIS
TECHNIQUE: Non-contrast CT of the chest was initially obtained. Multidetector
CT imaging through the chest, abdomen and pelvis was performed using
the standard protocol during bolus administration of intravenous
contrast. Multiplanar reconstructed images and MIPs were obtained
and reviewed to evaluate the vascular anatomy.
CONTRAST:  75mL OMNIPAQUE IOHEXOL 350 MG/ML SOLN

[Series 6: arterial · axial · arterial · 0.84mm/px · z∈[-496,+2]mm · 8 of 317 slices shown, 9 images]
[im 34/317  soft-tissue]
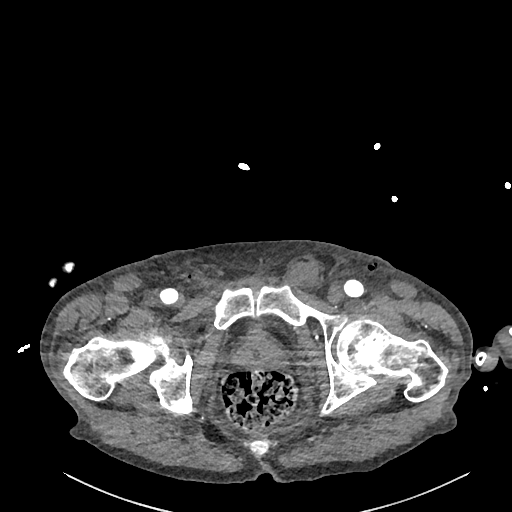
[im 34/317  bone]
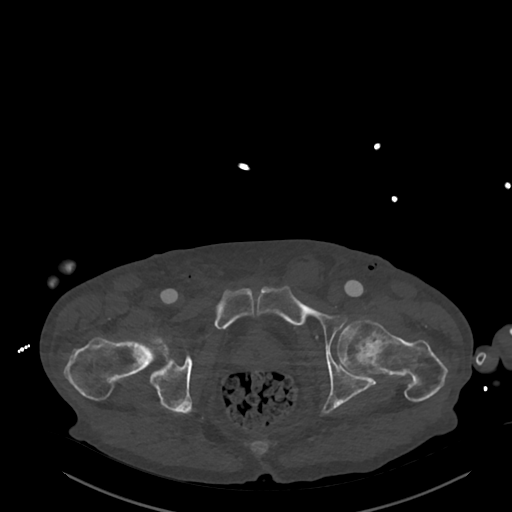
[im 67/317  soft-tissue]
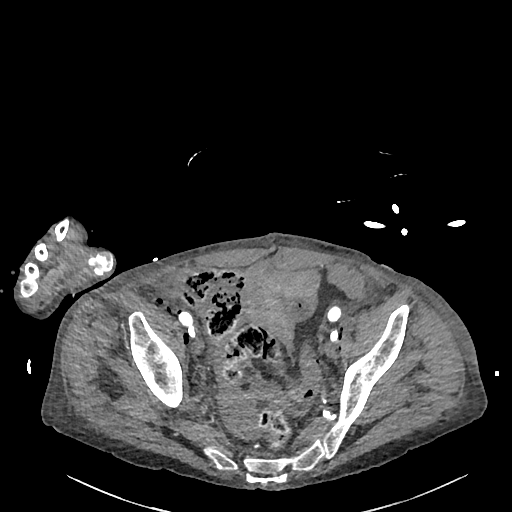
[im 100/317  soft-tissue]
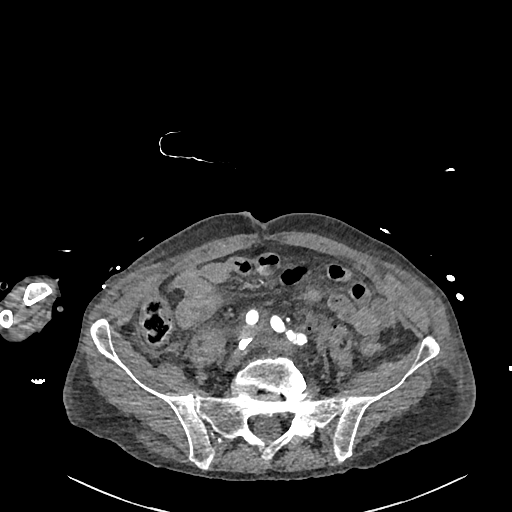
[im 134/317  soft-tissue]
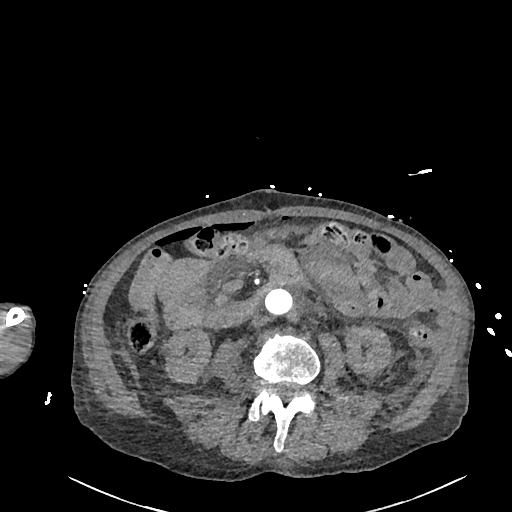
[im 183/317  soft-tissue]
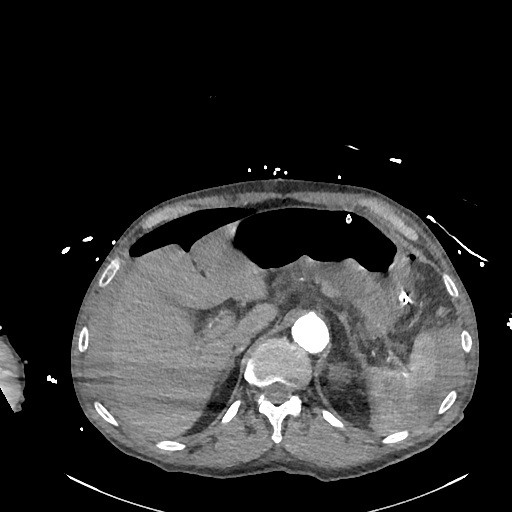
[im 217/317  soft-tissue]
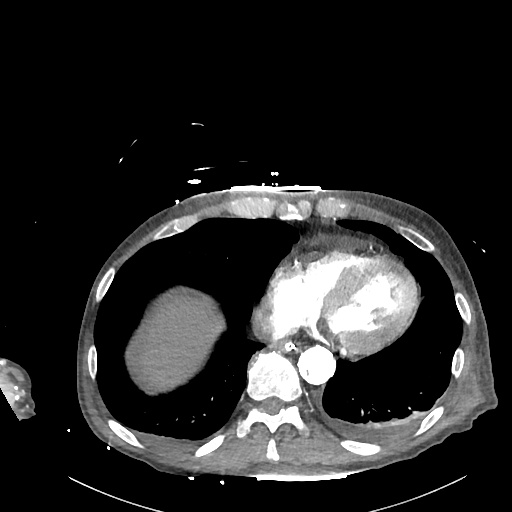
[im 250/317  soft-tissue]
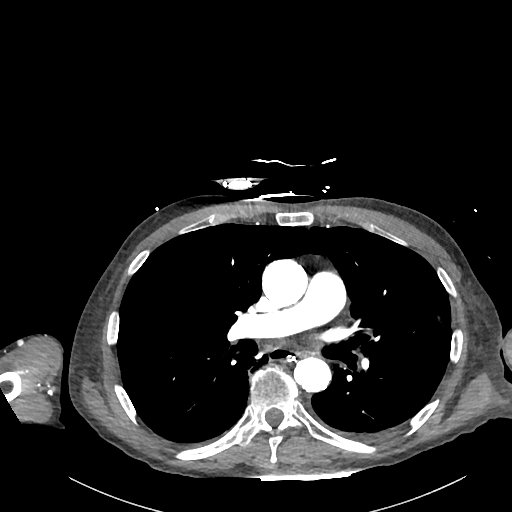
[im 283/317  soft-tissue]
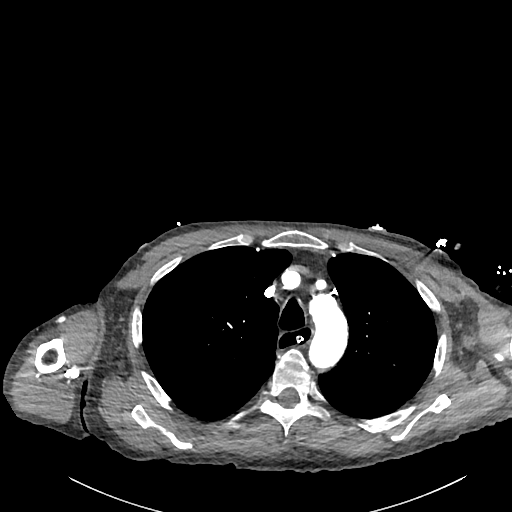

[Series 9: cor · coronal · 0.68mm/px · 3 of 145 slices shown]
[im 37/145  soft-tissue]
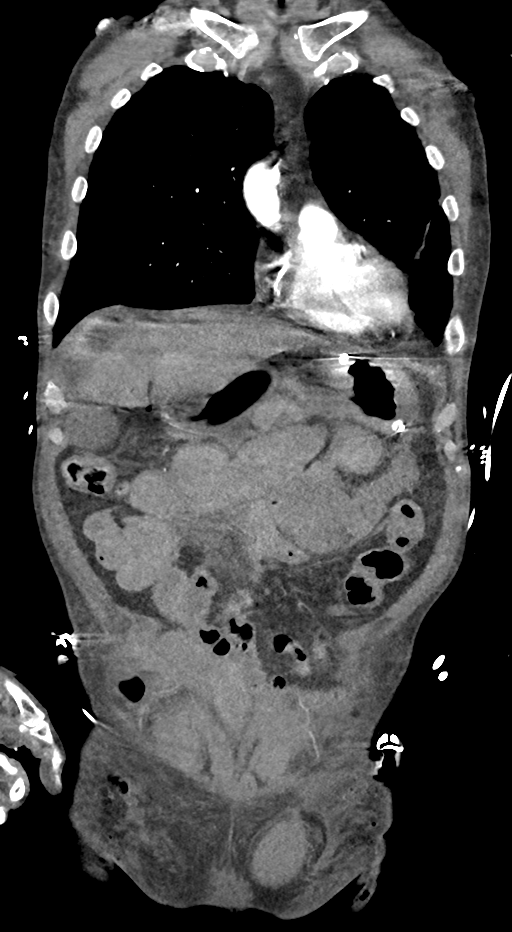
[im 73/145  soft-tissue]
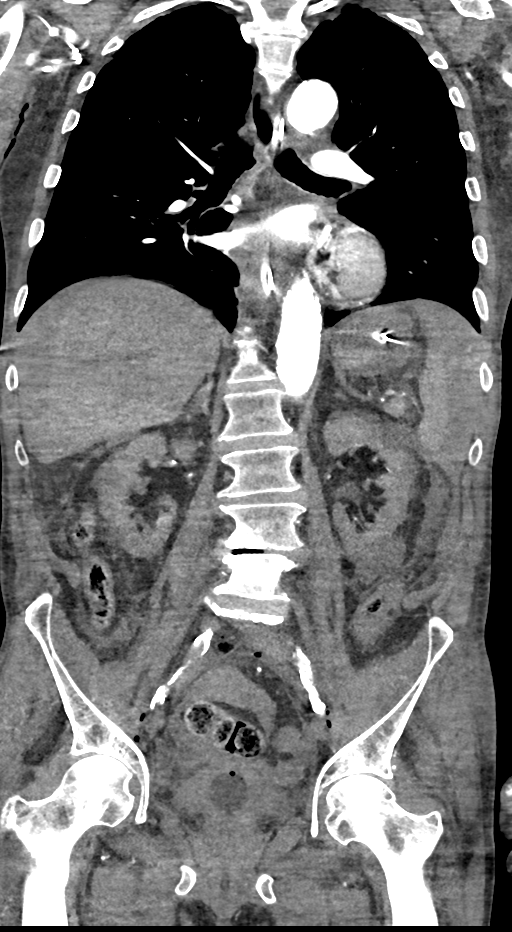
[im 109/145  soft-tissue]
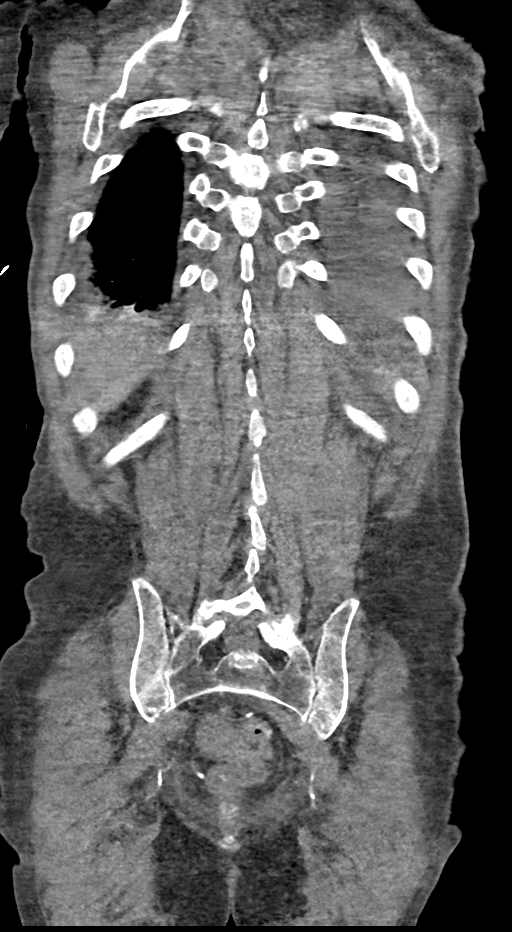

[11 of 46 positions shown; findings below may reference images not displayed]

FINDINGS: CTA CHEST FINDINGS

Cardiovascular: Advanced aortic atherosclerosis. This includes
irregular plaque involving the distal thoracic aorta which is
peripherally calcified, not significantly changed from prior exam.
No aortic hematoma. No aortic aneurysm, dissection, or evidence of
acute aortic syndrome. Conventional branching pattern from the
aortic arch with patent branch vessels. No central pulmonary embolus
to the segmental level. Heart is normal in size. Small pericardial
effusion measures up to 17 mm anteriorly. Left internal jugular
venous catheter courses into the hemi azygous system in terminates
just above the thoracic aorta. There is no adjacent perivascular
stranding.

Mediastinum/Nodes: Enteric tube within the esophagus which is
patulous and air-filled. Endotracheal tube tip is above the carina.
Minimal mucus within the distal trachea and right mainstem bronchus.
No mediastinal or hilar adenopathy. No suspicious thyroid nodule.

Lungs/Pleura: Apical predominant emphysema, at least moderate in
degree. Spiculated nodule in the lingula measures 15 x 12 mm, series
8, image 72, not significantly changed. Adjacent ground-glass
opacity in the lingula is stable. Smaller adjacent ground-glass
nodule on prior CT is not significantly changed, including 7 mm on
series 8, image 68. Two adjacent subpleural pulmonary nodules in the
right lower lobe, largest measuring 7 mm series 8, image 57 again
seen. There is a new subpleural nodule in the right lower lobe
measuring 5 mm, series 8, image 77. There are small bilateral
pleural effusions with adjacent compressive atelectasis. No
pneumothorax. No findings of pulmonary edema. Minimal retained mucus
in the distal trachea and right mainstem bronchus.

Musculoskeletal: Cortical buckling of the sternal body is new from
prior and likely related to CPR. No rib fractures. Lucency in the
anterior left glenoid may represent a subchondral cyst, but is
nonspecific, series 8, image 5

Review of the MIP images confirms the above findings.

CTA ABDOMEN AND PELVIS FINDINGS

VASCULAR

Aorta: Advanced aortic atherosclerosis including calcified
noncalcified plaque in the upper abdominal aorta projecting into the
aortic lumen. No upper abdominal stenosis. Aorto bifem graft with
adjacent air and stranding, likely related to surgery yesterday.
There is no evidence of active extravasation or aortic leak. Aortic
graft is patent. Ill-defined air in fluid adjacent to the upper
aspect of the graft native distal aorta is densely calcified.

Celiac: Plaque at the origin causes mild to moderate stenosis with
poststenotic dilatation. Celiac dilatation of 10 mm just proximal to
the hepatic splenic bifurcation. The hepatic artery is diminutive
and not well opacified.

SMA: Patent with heavily calcified plaque proximally. Distal
branches are diminutive. No evidence of thrombus or embolic disease.
No severe stenosis proximally.

Renals: Patent with moderate plaque. Distal branches are diminutive.

IMA: Patent arising from the bypass graft.

Inflow: Bi femoral bypass graft is patent without stenosis or
dissection. Air and stranding adjacent to the graft is likely
related to recent surgery.

Veins: Unopacified and not well assessed. There is no portal venous
or mesenteric gas.

Review of the MIP images confirms the above findings.

NON-VASCULAR

Hepatobiliary: No obvious focal abnormality on arterial phase
imaging. Distended gallbladder without inflammation or calcified
stone.

Pancreas: No ductal dilatation or inflammation.

Spleen: Calcified granuloma. Normal in size.

Adrenals/Urinary Tract: No adrenal nodule. Slightly hyperdense
adrenal glands. Heterogeneous with suspected bilateral cysts, not
well assessed on this arterial phase exam. No hydronephrosis. Foley
catheter decompresses the urinary bladder.

Stomach/Bowel: Enteric tube within the stomach. There is no definite
gastric wall thickening. Heterogeneous appearance of small bowel
which appears clumped and has interposed high-density fluid. There
is no obvious bowel pneumatosis. No obstruction. Colonic
diverticulosis without diverticulitis. Equivocal wall thickening
about the splenic flexure of the colon.

Lymphatic: Limited assessment for adenopathy on the current exam.

Reproductive: Prostate is unremarkable.

Other: Moderate volume of complex free fluid suspicious for blood
present in both upper quadrants, tracking in the pericolic gutters
and in the pelvis. Ill-defined fluid insinuating throughout small
bowel suspected, not well-defined. There is moderate amount of free
air, greatest under the hemidiaphragms, as well as scattered
throughout the mesentery. Fluid tracks into the left inguinal canal.
Suspected small bowel loop extending into the left inguinal canal,
better appreciated on prior exam.

Musculoskeletal: Degenerative change in the spine. There are no
acute or suspicious osseous abnormalities.

Review of the MIP images confirms the above findings.
IMPRESSION: 1. Post recent aorto bifem bypass graft, the graft is patent. Air
and stranding adjacent to the graft is likely related to recent
surgery. No active arterial extravasation.
2. Free intra-abdominal air as well as moderate volume
hemoperitoneum within both upper quadrants, tracking into the pelvis
and mesentery. Mesenteric blood is difficult to differentiate from
adjacent small bowel loops. Findings are suspicious for bowel
perforation, though the site of perforation is not localized on the
current exam. Bowel ischemia suspected in this clinical scenario,
but otherwise no bowel pneumatosis or obvious site of ischemic
bowel.
3. Left internal jugular venous catheter courses into the hemi
azygous system in the upper chest, terminates just above the
thoracic aorta.
4. Additional nonacute findings as described above.

Aortic Atherosclerosis ([YV]-[YV]) and Emphysema ([YV]-[YV]).

These results were called by telephone at the time of interpretation
on [DATE] at [DATE] to provider ATTRACTIVEE , who verbally
acknowledged these results.

## 2020-11-30 IMAGING — DX DG CHEST 1V PORT
1 series · 1 of 1 positions shown · non-contrast
Comparison: Portable chest [DATE] and earlier.

CLINICAL DATA: 79-year-old male status post aortic bypass surgery.

EXAM:
PORTABLE CHEST 1 VIEW

[chest]
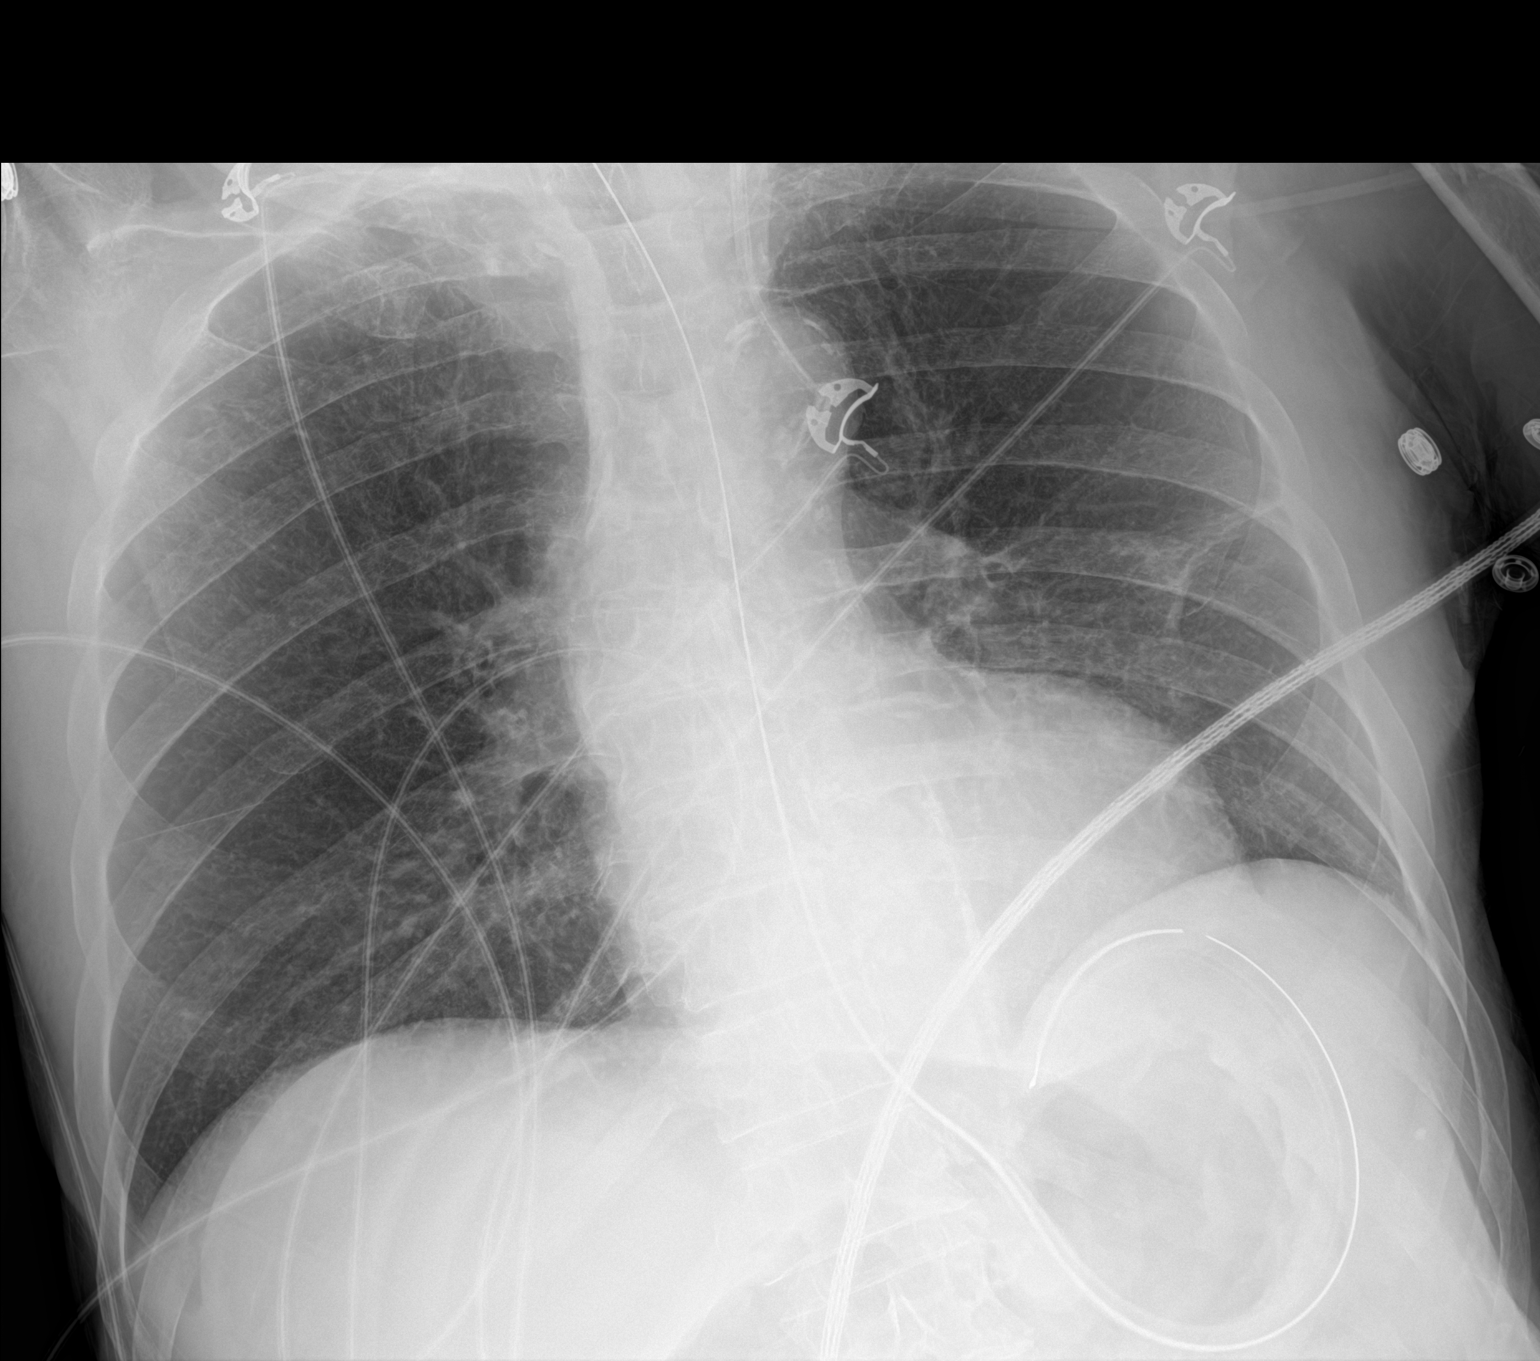

[1 of 1 positions shown; findings below may reference images not displayed]

FINDINGS: Portable AP supine view at [Z7] hours. Enteric tube looped in the
stomach. Stable left IJ vascular line, tip again projecting over the
aortic arch. Allowing for portable technique the lungs are clear. No
pneumothorax.
IMPRESSION: 1. Stable left IJ vascular line, tip again projecting over the
aortic arch. Recommend transducing the waveform to exclude Common
Carotid Artery placement.
2. No superimposed acute cardiopulmonary abnormality. Enteric tube
loops in the stomach.

## 2020-11-30 IMAGING — DX DG CHEST 1V PORT
2 series · 2 of 2 positions shown · non-contrast
Comparison: Film from earlier in the same day.

CLINICAL DATA: Status post central line placement

EXAM:
PORTABLE CHEST 1 VIEW

[chest ap (1 of 2)]
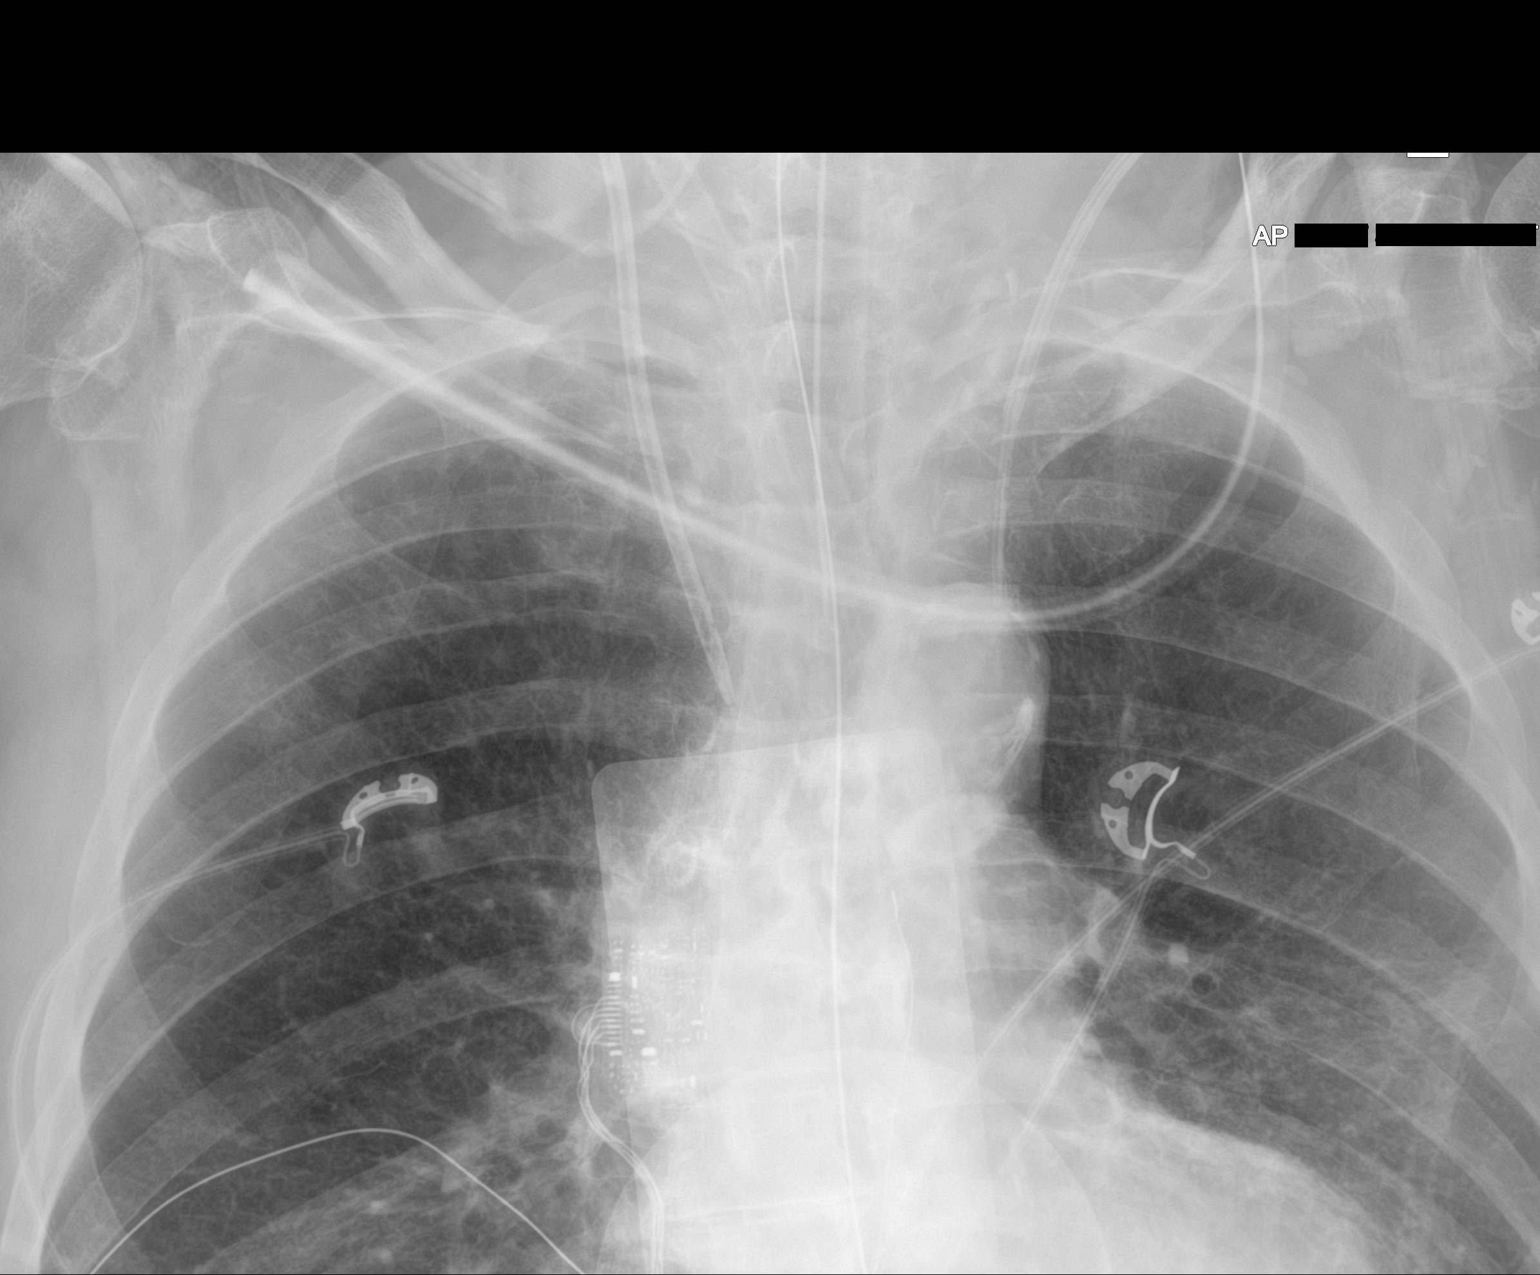

[chest ap (2 of 2)]
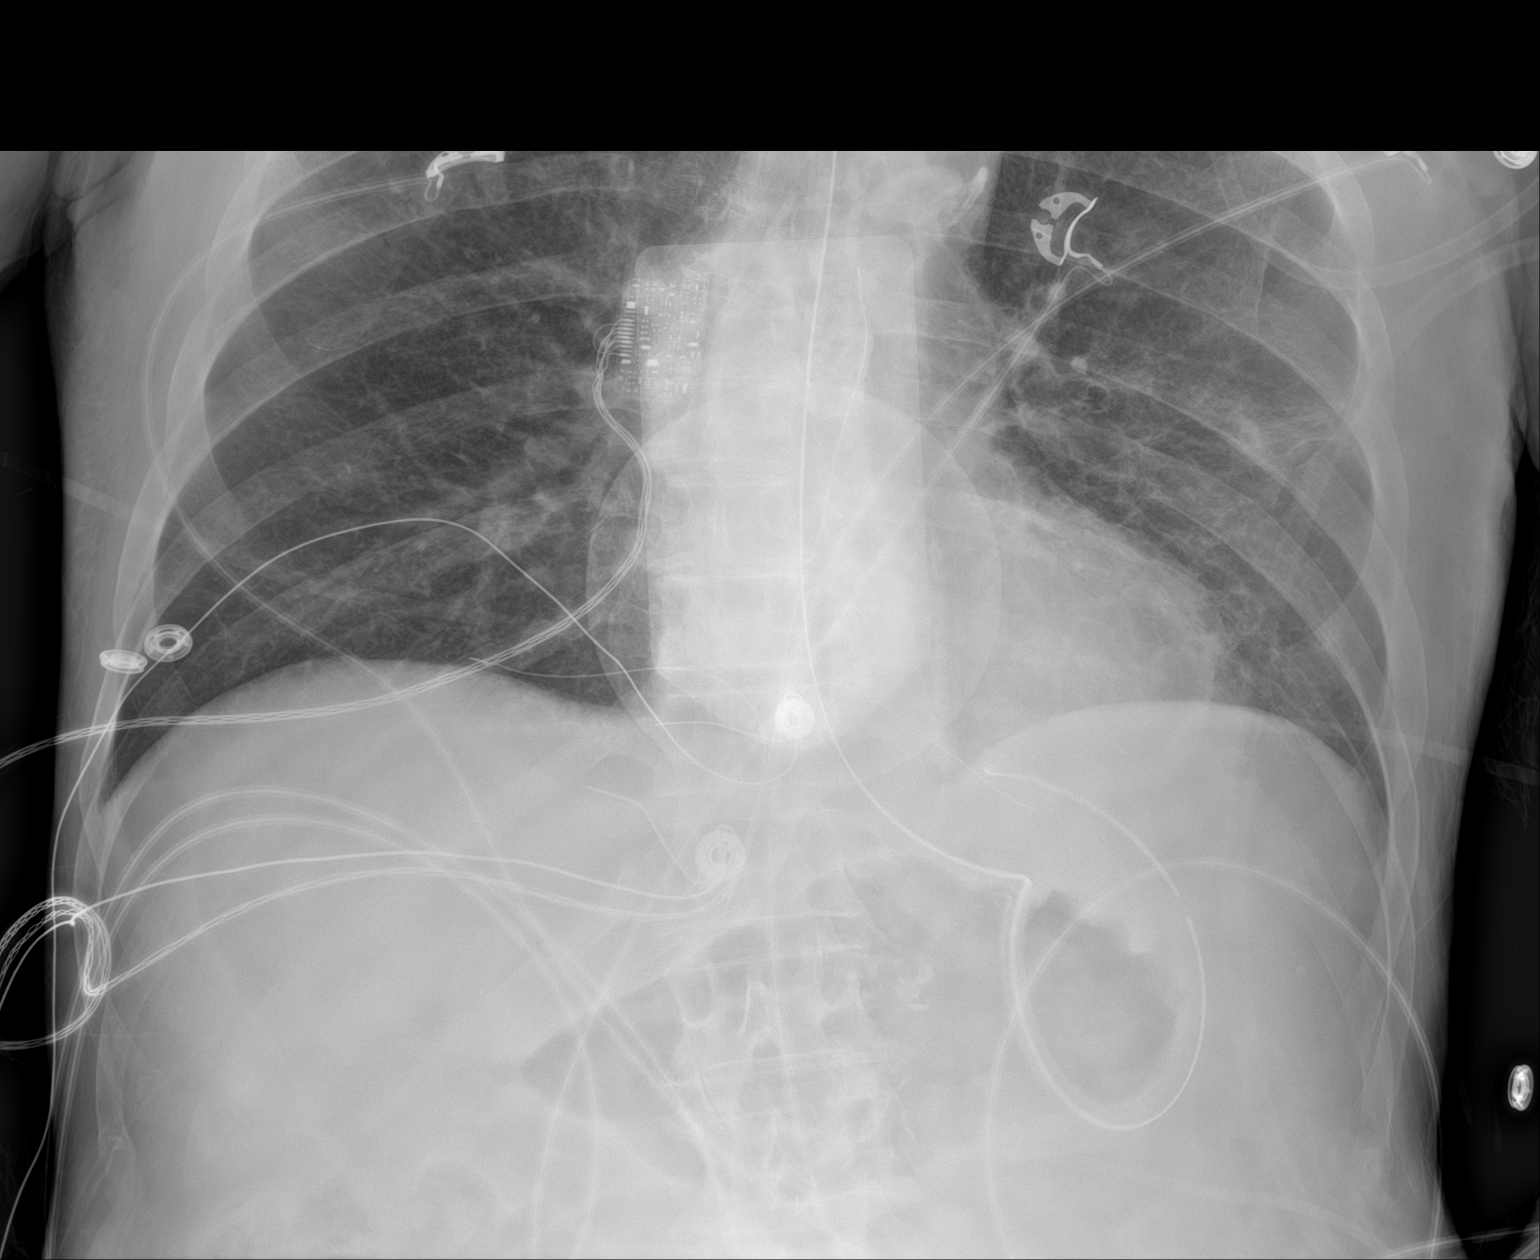

[2 of 2 positions shown; findings below may reference images not displayed]

FINDINGS: Endotracheal tube, gastric catheter and left jugular central line
are again identified and stable. New right jugular dialysis catheter
is seen in the proximal superior vena cava. No pneumothorax is
noted. The lungs are well aerated without focal infiltrate. Cardiac
shadow is stable.
IMPRESSION: No pneumothorax following right central line placement. Catheter tip
is noted in the proximal superior vena cava.

## 2020-11-30 MED ORDER — VANCOMYCIN HCL 1250 MG/250ML IV SOLN
1250.0000 mg | Freq: Once | INTRAVENOUS | Status: AC
  Administered 2020-11-30: 1250 mg via INTRAVENOUS
  Filled 2020-11-30: qty 250

## 2020-11-30 MED ORDER — DEXTROSE 50 % IV SOLN
1.0000 | Freq: Once | INTRAVENOUS | Status: AC
  Administered 2020-11-30: 50 mL via INTRAVENOUS
  Filled 2020-11-30: qty 50

## 2020-11-30 MED ORDER — PROPOFOL 1000 MG/100ML IV EMUL
5.0000 ug/kg/min | INTRAVENOUS | Status: DC
  Administered 2020-11-30 – 2020-12-01 (×2): 25 ug/kg/min via INTRAVENOUS
  Administered 2020-12-02: 15 ug/kg/min via INTRAVENOUS
  Administered 2020-12-03: 5 ug/kg/min via INTRAVENOUS
  Filled 2020-11-30 (×5): qty 100

## 2020-11-30 MED ORDER — ROCURONIUM BROMIDE 10 MG/ML (PF) SYRINGE
PREFILLED_SYRINGE | INTRAVENOUS | Status: AC
  Filled 2020-11-30: qty 10

## 2020-11-30 MED ORDER — SODIUM CHLORIDE 0.9% IV SOLUTION: Freq: Once | INTRAVENOUS | Status: AC

## 2020-11-30 MED ORDER — MAGNESIUM SULFATE 2 GM/50ML IV SOLN
2.0000 g | Freq: Once | INTRAVENOUS | Status: AC
  Administered 2020-11-30: 2 g via INTRAVENOUS
  Filled 2020-11-30: qty 50

## 2020-11-30 MED ORDER — HEPARIN SODIUM (PORCINE) 1000 UNIT/ML DIALYSIS
1000.0000 [IU] | INTRAMUSCULAR | Status: DC | PRN
  Administered 2020-11-30: 2400 [IU] via INTRAVENOUS_CENTRAL
  Filled 2020-11-30: qty 6
  Filled 2020-11-30: qty 4
  Filled 2020-11-30: qty 6

## 2020-11-30 MED ORDER — POLYETHYLENE GLYCOL 3350 17 G PO PACK
17.0000 g | PACK | Freq: Every day | ORAL | Status: DC
  Filled 2020-11-30: qty 1

## 2020-11-30 MED ORDER — CHLORHEXIDINE GLUCONATE 0.12% ORAL RINSE (MEDLINE KIT): 15.0000 mL | Freq: Two times a day (BID) | OROMUCOSAL | Status: DC

## 2020-11-30 MED ORDER — EPINEPHRINE HCL 5 MG/250ML IV SOLN IN NS
0.5000 ug/min | INTRAVENOUS | Status: DC
  Administered 2020-12-02: 8 ug/min via INTRAVENOUS
  Filled 2020-11-30 (×2): qty 250

## 2020-11-30 MED ORDER — STERILE WATER FOR INJECTION IV SOLN
INTRAVENOUS | Status: DC
  Filled 2020-11-30 (×9): qty 1000

## 2020-11-30 MED ORDER — ORAL CARE MOUTH RINSE
15.0000 mL | OROMUCOSAL | Status: DC
  Administered 2020-11-30 – 2020-12-04 (×30): 15 mL via OROMUCOSAL

## 2020-11-30 MED ORDER — ONDANSETRON HCL 4 MG/2ML IJ SOLN: 4.0000 mg | Freq: Four times a day (QID) | INTRAMUSCULAR | Status: DC | PRN

## 2020-11-30 MED ORDER — DIPHENHYDRAMINE HCL 12.5 MG/5ML PO ELIX: 12.5000 mg | ORAL_SOLUTION | Freq: Four times a day (QID) | ORAL | Status: DC | PRN

## 2020-11-30 MED ORDER — IOHEXOL 350 MG/ML SOLN
75.0000 mL | Freq: Once | INTRAVENOUS | Status: AC | PRN
  Administered 2020-11-30: 75 mL via INTRAVENOUS

## 2020-11-30 MED ORDER — SODIUM CHLORIDE 0.9% FLUSH: 9.0000 mL | INTRAVENOUS | Status: DC | PRN

## 2020-11-30 MED ORDER — SODIUM BICARBONATE 8.4 % IV SOLN: 150.0000 meq | Freq: Once | INTRAVENOUS | Status: AC

## 2020-11-30 MED ORDER — PRISMASOL BGK 0/2.5 32-2.5 MEQ/L EC SOLN
Status: DC
  Filled 2020-11-30 (×5): qty 5000

## 2020-11-30 MED ORDER — ETOMIDATE 2 MG/ML IV SOLN
INTRAVENOUS | Status: AC
  Administered 2020-11-30: 20 mg via INTRAVENOUS
  Filled 2020-11-30: qty 20

## 2020-11-30 MED ORDER — ORAL CARE MOUTH RINSE: 15.0000 mL | OROMUCOSAL | Status: DC

## 2020-11-30 MED ORDER — SODIUM CHLORIDE 0.9 % FOR CRRT: 500.0000 mL | INTRAVENOUS_CENTRAL | Status: DC | PRN

## 2020-11-30 MED ORDER — DIPHENHYDRAMINE HCL 50 MG/ML IJ SOLN: 12.5000 mg | Freq: Four times a day (QID) | INTRAMUSCULAR | Status: DC | PRN

## 2020-11-30 MED ORDER — FENTANYL CITRATE (PF) 100 MCG/2ML IJ SOLN
25.0000 ug | INTRAMUSCULAR | Status: DC | PRN
  Administered 2020-12-02 – 2020-12-03 (×2): 25 ug via INTRAVENOUS
  Filled 2020-11-30 (×3): qty 2

## 2020-11-30 MED ORDER — ETOMIDATE 2 MG/ML IV SOLN: 20.0000 mg | Freq: Once | INTRAVENOUS | Status: AC

## 2020-11-30 MED ORDER — VANCOMYCIN HCL 750 MG/150ML IV SOLN
750.0000 mg | INTRAVENOUS | Status: DC
  Administered 2020-12-01 – 2020-12-03 (×3): 750 mg via INTRAVENOUS
  Filled 2020-11-30 (×3): qty 150

## 2020-11-30 MED ORDER — SODIUM CHLORIDE 0.9 % IV BOLUS
1000.0000 mL | Freq: Once | INTRAVENOUS | Status: AC
  Administered 2020-11-30: 1000 mL via INTRAVENOUS

## 2020-11-30 MED ORDER — HYDROMORPHONE 1 MG/ML IV SOLN
INTRAVENOUS | Status: DC
  Administered 2020-11-30: 0.3 mg via INTRAVENOUS
  Administered 2020-11-30: 30 mg via INTRAVENOUS
  Filled 2020-11-30: qty 30

## 2020-11-30 MED ORDER — FENTANYL CITRATE (PF) 100 MCG/2ML IJ SOLN
25.0000 ug | INTRAMUSCULAR | Status: DC | PRN
  Administered 2020-12-01 – 2020-12-03 (×3): 50 ug via INTRAVENOUS
  Filled 2020-11-30 (×2): qty 2

## 2020-11-30 MED ORDER — SODIUM CHLORIDE 0.9 % IV SOLN
2.0000 g | Freq: Two times a day (BID) | INTRAVENOUS | Status: DC
  Administered 2020-12-01 – 2020-12-03 (×6): 2 g via INTRAVENOUS
  Filled 2020-11-30 (×6): qty 2

## 2020-11-30 MED ORDER — SODIUM CHLORIDE 0.9 % IV SOLN
4.0000 g | Freq: Once | INTRAVENOUS | Status: AC
  Administered 2020-11-30: 4 g via INTRAVENOUS
  Filled 2020-11-30: qty 40

## 2020-11-30 MED ORDER — NOREPINEPHRINE 4 MG/250ML-% IV SOLN
0.0000 ug/min | INTRAVENOUS | Status: DC
  Administered 2020-11-30: 12 ug/min via INTRAVENOUS
  Administered 2020-12-01 (×3): 20 ug/min via INTRAVENOUS
  Filled 2020-11-30 (×6): qty 250

## 2020-11-30 MED ORDER — VANCOMYCIN HCL 1250 MG/250ML IV SOLN
1250.0000 mg | INTRAVENOUS | Status: DC
  Filled 2020-11-30: qty 250

## 2020-11-30 MED ORDER — INSULIN ASPART 100 UNIT/ML IV SOLN
5.0000 [IU] | Freq: Once | INTRAVENOUS | Status: AC
  Administered 2020-11-30: 5 [IU] via INTRAVENOUS

## 2020-11-30 MED ORDER — NALOXONE HCL 0.4 MG/ML IJ SOLN
0.4000 mg | INTRAMUSCULAR | Status: DC | PRN
  Administered 2020-11-30: 0.4 mg via INTRAVENOUS
  Filled 2020-11-30: qty 1

## 2020-11-30 MED ORDER — SODIUM BICARBONATE 8.4 % IV SOLN
INTRAVENOUS | Status: AC
  Filled 2020-11-30: qty 50

## 2020-11-30 MED ORDER — DOCUSATE SODIUM 50 MG/5ML PO LIQD
100.0000 mg | Freq: Two times a day (BID) | ORAL | Status: DC
  Administered 2020-12-01 – 2020-12-03 (×3): 100 mg
  Filled 2020-11-30 (×4): qty 10

## 2020-11-30 MED ORDER — SODIUM CHLORIDE 0.9 % IV SOLN
2.0000 g | INTRAVENOUS | Status: DC
  Administered 2020-11-30: 2 g via INTRAVENOUS
  Filled 2020-11-30: qty 2

## 2020-11-30 MED ORDER — PRISMASOL BGK 0/2.5 32-2.5 MEQ/L EC SOLN
Status: DC
  Filled 2020-11-30 (×33): qty 5000

## 2020-11-30 MED ORDER — PRISMASOL BGK 0/2.5 32-2.5 MEQ/L EC SOLN
Status: DC
  Filled 2020-11-30 (×8): qty 5000

## 2020-11-30 MED ORDER — CALCIUM CHLORIDE 10 % IV SOLN
1.0000 g | Freq: Once | INTRAVENOUS | Status: AC
  Administered 2020-11-30: 1 g via INTRAVENOUS

## 2020-11-30 MED ORDER — SODIUM BICARBONATE 8.4 % IV SOLN
50.0000 meq | Freq: Once | INTRAVENOUS | Status: AC
  Administered 2020-12-01 (×2): 50 mL via INTRAVENOUS

## 2020-11-30 MED ORDER — SODIUM BICARBONATE 8.4 % IV SOLN
INTRAVENOUS | Status: AC
  Administered 2020-11-30: 150 meq via INTRAVENOUS
  Filled 2020-11-30: qty 250

## 2020-11-30 MED ORDER — SODIUM CHLORIDE 0.9% IV SOLUTION: Freq: Once | INTRAVENOUS | Status: DC

## 2020-11-30 MED ORDER — CHLORHEXIDINE GLUCONATE 0.12% ORAL RINSE (MEDLINE KIT)
15.0000 mL | Freq: Two times a day (BID) | OROMUCOSAL | Status: DC
  Administered 2020-11-30 – 2020-12-03 (×7): 15 mL via OROMUCOSAL

## 2020-11-30 MED ORDER — DIPHENHYDRAMINE HCL 50 MG/ML IJ SOLN
12.5000 mg | Freq: Four times a day (QID) | INTRAMUSCULAR | Status: DC | PRN
  Administered 2020-11-30: 12.5 mg via INTRAVENOUS
  Filled 2020-11-30: qty 1

## 2020-11-30 MED ORDER — SODIUM CHLORIDE 0.9 % IV BOLUS
500.0000 mL | Freq: Once | INTRAVENOUS | Status: AC
  Administered 2020-12-01: 500 mL via INTRAVENOUS

## 2020-11-30 MED ORDER — ORAL CARE MOUTH RINSE: 15.0000 mL | Freq: Two times a day (BID) | OROMUCOSAL | Status: DC

## 2020-11-30 MED ORDER — SODIUM BICARBONATE 8.4 % IV SOLN
100.0000 meq | Freq: Once | INTRAVENOUS | Status: AC
  Administered 2020-11-30: 100 meq via INTRAVENOUS

## 2020-11-30 NOTE — Progress Notes (Signed)
Pharmacy Antibiotic Note  Brian Yates is a 80 y.o. male admitted on 12/27/2020 with aortobifem bypass grafting. POD#1 pt developed AKI and ultimately PEA arrested. Pharmacy has been consulted for vancomycin and cefepime dosing given elevated WBC.  He is to start CRRT  Plan: Change cefepime to 2g q12h Vancomycin 1250mg  IVx1 then 750mg  IV q24hr Will follow r cultures and clinical progress    Height: 5\' 8"  (172.7 cm) Weight: 62.7 kg (138 lb 3.7 oz) IBW/kg (Calculated) : 68.4  Temp (24hrs), Avg:97.8 F (36.6 C), Min:97.2 F (36.2 C), Max:98.5 F (36.9 C)  Recent Labs  Lab 11/28/20 1100 12/15/2020 1853 11/30/20 0119 11/30/20 0404 11/30/20 1227 11/30/20 1506  WBC 12.5* 38.4* 45.3* 40.5* 55.3*  --   CREATININE 1.05 1.24  --  2.12* 2.83*  --   LATICACIDVEN  --   --   --   --  >11.0* >11.0*    Estimated Creatinine Clearance: 19.1 mL/min (A) (by C-G formula based on SCr of 2.83 mg/dL (H)).    Allergies  Allergen Reactions  . Bentyl [Dicyclomine Hcl] Other (See Comments)    Hallucinations   . Codeine Sulfate [Codeine] Nausea And Vomiting  . Lisinopril Other (See Comments)    unknown  . Valsartan Other (See Comments)    unknown    Antimicrobials this admission: Cefepime 6/2 >>  Vancomycin 6/2 >>    Microbiology results: pending  Thank you for allowing pharmacy to be a part of this patient's care.  Hildred Laser, PharmD Clinical Pharmacist **Pharmacist phone directory can now be found on [email or web address] ([email or web address]).  Listed under [place].

## 2020-11-30 NOTE — Progress Notes (Signed)
   Called to bedside after patient had PE arrest and was intubated as well as placed on pressors Levophed for hypotensive support.  No doppler signals, no change from this am exam by Dr. Trula Slade.  He had intact motor of B LE earlier this am and denied pain.     B feet cool to touch prior to pressors being started. Palpable femoral pulses, incision healing well without signs of infection. Abdomin soft without distention.  No evidence of active blood loss.   Leukocytosis 40 since 12/10/2020, 11/28/20 WBC was 12.5.  Unexplained.  On Cefepime IV and Vancomycin.    Appreciated CCM help  Roxy Horseman PA-C

## 2020-11-30 NOTE — Procedures (Signed)
Arterial Catheter Insertion Procedure Note  Brian Yates  [id number]  Oct 20, 1940  Date:11/30/20  Time:1:20 PM    Provider Performing: Candee Furbish    Procedure: Insertion of Arterial Line [phone]) with US guidance ([id number])   Indication(s) Blood pressure monitoring and/or need for frequent ABGs  Consent Unable to obtain consent due to emergent nature of procedure.  Anesthesia None   Time Out Verified patient identification, verified procedure, site/side was marked, verified correct patient position, special equipment/implants available, medications/allergies/relevant history reviewed, required imaging and test results available.   Sterile Technique Maximal sterile technique including full sterile barrier drape, hand hygiene, sterile gown, sterile gloves, mask, hair covering, sterile ultrasound probe cover (if used).   Procedure Description Area of catheter insertion was cleaned with chlorhexidine and draped in sterile fashion. With real-time ultrasound guidance an arterial catheter was placed into the right radial artery.  Appropriate arterial tracings confirmed on monitor.     Complications/Tolerance None; patient tolerated the procedure well.   EBL Minimal   Specimen(s) None

## 2020-11-30 NOTE — Progress Notes (Signed)
Patient stable this AM, was hypotensive overnight with minimal UOP. Received 1L NS bolus overnight and additional 1L bolus this AM per orders. Arterial line removed per order and patient transferred to chair with PT. Shortly after, patient began complaining of chest pain and shortness of breath. EKG obtained, which did not indicate STEMI. Patient transferred back to bed and proceeded to become more lethargic and disoriented. VVS paged. While waiting for response, patient continued to decompensate and became bradycardic, then went PEA. Code initiated w/ CPR and ACLS meds as per Code Sheet documentation and Dr Thompson Caul orders. ROSC obtained after initiation of transcutaneous pacing. Patient awake but not oriented or following commands so was intubated by CCM. Right radial arterial line placed by Dr Tamala Julian. Labs obtained and interventions carried out per orders, please see MAR documentation. After patient was stabilized, TEE was performed by cardiology and then patient was transported to and from CT. Upon return from CT, R IJ trialysis catheter was placed by CCM. CRRT initiated per nephrology orders, however negative access alarms persisted despite troubleshooting. CCM to come evaluate HD catheter when able, in meantime HD cath was heparin locked. Patient's wife and sister were present today and updated frequently by nursing staff and providers.  5 mL morphine and 29.7 mL dilaudid (patient only received 0.3mg  loading dose) from PCA syringes wasted in [facility] med room Stericycle with Rebeca Alert RN.  [employer] RN

## 2020-11-30 NOTE — Procedures (Signed)
    TRANSESOPHAGEAL ECHOCARDIOGRAM   NAME:  Brian Yates    MRN: [id number] DOB:  Nov 29, 1940    ADMIT DATE: 12/09/2020  INDICATIONS: Shock post cardiac arrest; recent surgery  PROCEDURE:   Informed consent was obtained prior to the procedure in brief with wife and family. The risks, benefits and alternatives for the procedure were discussed and the patient comprehended these risks.  Risks include, but are not limited to, cough, sore throat, vomiting, nausea, somnolence, esophageal and stomach trauma or perforation, bleeding, low blood pressure, aspiration, pneumonia, infection, trauma to the teeth and death.    Procedural time out performed. The oropharynx was anesthetized with topical 1% benzocaine.    Anesthesia was administered by Dr. Thompson Caul team.  The patient was administered a total of 100 mg propofolto achieve and maintain moderate to deep sedation.  The patient's heart rate, blood pressure, and oxygen saturation are monitored continuously during the procedure. The period of conscious sedation is 36 minutes, of which I was present face-to-face 100% of this time.   The transesophageal probe was inserted in the esophagus and stomach without difficulty and multiple views were obtained.   COMPLICATIONS:    There were no immediate complications.  KEY FINDINGS:  1. Hyperdynamic LV and RV increase RVOT flow (mild subvalvular stenosis. 2. Moderate Pericardial effusion over the RV anterior without Doppler evidence of increased pericardial pressure  3. Full report to follow. 4. Further management per primary team.  5. Discussed with critical care team, primary cardiologist, and with patient's wife and daughter.  Rudean Haskell, MD [place]  3:50 PM

## 2020-11-30 NOTE — Progress Notes (Addendum)
  Aortic Surgery Progress Note    11/30/2020 6:47 AM 1 Day Post-Op  Subjective:  Wants something to eat and drink.  Says he can wiggles his toes.  Denies any pain in his feet.   Afebrile HR 70's-100's NSR 56'E-332'R systolic 51% 8AC1YS  Gtts:  None   Vitals:   11/30/20 0500 11/30/20 0530  BP: 104/63 99/68  Pulse: 89 88  Resp: 13 11  Temp:    SpO2: 95% 97%    Physical Exam: Cardiac:  regular Lungs:  Non labored Abdomen:  Soft, non tender to palpation.  -flatus Incisions:  Laparotomy incision and bilateral groins clean and dry without hematoma. Extremities:  Feet cool to touch.  Bilateral calves are soft General:  No distress  CBC    Component Value Date/Time   WBC 40.5 (H) 11/30/2020 0404   RBC 3.08 (L) 11/30/2020 0404   HGB 9.4 (L) 11/30/2020 0404   HCT 28.3 (L) 11/30/2020 0404   PLT 184 11/30/2020 0404   MCV 91.9 11/30/2020 0404   MCH 30.5 11/30/2020 0404   MCHC 33.2 11/30/2020 0404   RDW 15.5 11/30/2020 0404    BMET    Component Value Date/Time   NA 134 (L) 11/30/2020 0404   K 4.4 11/30/2020 0404   CL 108 11/30/2020 0404   CO2 18 (L) 11/30/2020 0404   GLUCOSE 165 (H) 11/30/2020 0404   BUN 21 11/30/2020 0404   CREATININE 2.12 (H) 11/30/2020 0404   CALCIUM 7.6 (L) 11/30/2020 0404   GFRNONAA 31 (L) 11/30/2020 0404    INR    Component Value Date/Time   INR 1.2 12/07/2020 1943     Intake/Output Summary (Last 24 hours) at 11/30/2020 0647 Last data filed at 11/30/2020 0500 Gross per 24 hour  Intake 5278.25 ml  Output 2240 ml  Net 3038.25 ml     Assessment/Plan:  80 y.o. male is s/p  Aortobifemoral bypass grafting with reimplantation of IMA, bilateral common femoral and profundo-femoral endarterectomy 1 Day Post-Op  -Vascular:  Difficult to obtain doppler signals.  With palpable femoral pulses.  Bilateral calves soft and non tender.   -Cardiac:  Hemodynamically stable not requiring any drips.  BP soft-will give another 1L fluid bolus.   -Pulmonary:  On 3LO2NC-wean as tolerated.  -Neuro:  In tact -Renal:  Acute kidney injury with creatinine of 2.1 today up from 1 pre op.  Received fluid bolus this morning.  Continue to monitor.   Given renal function, will change morphine PCA to dilaudid.  -GI:  No flatus; +belching; abdomen soft.  Pt can have ice chips. Keep NGT for one more day. -Incisions:  laparotomy incision and bilateral groin incisions look fine. -Heme/ID:  Acute surgical blood loss anemia-hgb 9.4 down from 12.4 pre-op.  Has not received any blood products.  He is not requiring any pressor support at this time.  Continue to monitor.   Leukocytosis-most likely related to peri-op.  Will continue to monitor.  U/a pre op negative.   -General:  No distress.  Will get up to chair today.   Pt on carbidopa-levodopa at home.  Will ask pharmacy to see if he can get this IV while he is npo.    Leontine Locket, PA-C [facility] [phone] 11/30/2020 6:47 AM   I agree with the above.  I have seen and examined the patient.  Annamarie Major

## 2020-11-30 NOTE — Procedures (Signed)
Central Venous Catheter Insertion Procedure Note  Brian Yates  [id number]  Aug 20, 1940  Date:11/30/20  Time:5:13 PM   Provider Performing:Tieara Flitton D Rollene Rotunda   Procedure: Insertion of Non-tunneled Central Venous Catheter(36556)with US guidance (82505)    Indication(s) Hemodialysis  Consent Risks of the procedure as well as the alternatives and risks of each were explained to the patient and/or caregiver.  Consent for the procedure was obtained and is signed in the bedside chart  Anesthesia Topical only with 1% lidocaine   Timeout Verified patient identification, verified procedure, site/side was marked, verified correct patient position, special equipment/implants available, medications/allergies/relevant history reviewed, required imaging and test results available.  Sterile Technique Maximal sterile technique including full sterile barrier drape, hand hygiene, sterile gown, sterile gloves, mask, hair covering, sterile ultrasound probe cover (if used).  Procedure Description Area of catheter insertion was cleaned with chlorhexidine and draped in sterile fashion.   With real-time ultrasound guidance a HD catheter was placed into the right internal jugular vein.  Nonpulsatile blood flow and easy flushing noted in all ports.  The catheter was sutured in place and sterile dressing applied.    Complications/Tolerance None; patient tolerated the procedure well. Chest X-ray is ordered to verify placement for internal jugular or subclavian cannulation.  Chest x-ray is not ordered for femoral cannulation.  EBL Minimal  Specimen(s) None  JD Rexene Agent [facility] 11/30/2020, 5:14 PM  Please see [email or web address] for pager details.  From 7A-7P if no response, please call [phone]. After hours, please call ELink [phone].'

## 2020-11-30 NOTE — Progress Notes (Signed)
*  PRELIMINARY RESULTS* Echocardiogram 2D Echocardiogram Limited has been performed.  Leavy Cella 11/30/2020, 1:57 PM

## 2020-11-30 NOTE — Progress Notes (Signed)
RT assisted with transportation of this pt from [facility] to CT while on full ventilatory support. Pt tolerated well with SVS.
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
## 2020-11-30 NOTE — Progress Notes (Signed)
Pharmacy Antibiotic Note  Brian Yates is a 80 y.o. male admitted on 12/11/2020 with aortobifem bypass grafting. POD#1 pt developed AKI and ultimately PEA arrested. Pharmacy has been consulted for vancomycin and cefepime dosing given elevated WBC.  Plan: Cefepime 2g q24h Vancomycin 1250mg  IV q48h - est AUC 502 Follow Cr closely, may need to dose via levels   Height: 5\' 8"  (172.7 cm) Weight: 62.7 kg (138 lb 3.7 oz) IBW/kg (Calculated) : 68.4  Temp (24hrs), Avg:97.8 F (36.6 C), Min:97.2 F (36.2 C), Max:98.5 F (36.9 C)  Recent Labs  Lab 11/28/20 1100 12/08/2020 1853 11/30/20 0119 11/30/20 0404  WBC 12.5* 38.4* 45.3* 40.5*  CREATININE 1.05 1.24  --  2.12*    Estimated Creatinine Clearance: 25.5 mL/min (A) (by C-G formula based on SCr of 2.12 mg/dL (H)).    Allergies  Allergen Reactions  . Bentyl [Dicyclomine Hcl] Other (See Comments)    Hallucinations   . Codeine Sulfate [Codeine] Nausea And Vomiting  . Lisinopril Other (See Comments)    unknown  . Valsartan Other (See Comments)    unknown    Antimicrobials this admission: Cefepime 6/2 >>  Vancomycin 6/2 >>    Microbiology results: pending  Thank you for allowing pharmacy to be a part of this patient's care.  Arrie Senate, PharmD, BCPS, [facility] Clinical Pharmacist [phone] Please check AMION for all [facility] numbers 11/30/2020

## 2020-11-30 NOTE — Progress Notes (Addendum)
  Addendum:  discussed CT c/a/p findings with radiologist -- 1)hemoperitoneum of both upper quadrants tracking into pericolic gutters and pelvis + some free abdominal air. Concern for possible bowel perf though there is no identifiable localized source, no pneumotosis or obvious ischemic bowel& some degree of free air is likely given recent abdominal operation.    2)Graft site patent, no active extrav 3)LIJ CVC in hemi azygous system.   D/w Dr. Donzetta Matters [facility] -  LIJ ok to remain as is. Will cont to trend abdominal exam, continue current care plan at present ______________________________________  PCCM Brief Progress Note   S: 80 yo M POD1 aortobifem bypass and now s/p cardiac arrest with ROSC. Post arrest workup reveals several metabolic derangements   O: ABG 6.9/28/314/8/23/6.7 WBC 55 hgb 7.3 ddimer 3.99  K 6.2 Cr 2.83  CK 1489  LA 11 CO2 14 AG 18 iCal 0.96  Alk phos 30 AST 230 ALT 130  Trop-I 206  ECHO with what looked to be an small pericardial effusion, not present on ECHO a few days ago. Read pending. ?tamponade  Remains critically ill appearing. ETT secure. On 40 NE 2 epi, weak radial pulses. Toes are a bit cyanotic and cap refill >3seconds. Hypoactive bowel sounds. Not making urine   A/P: AGMA Lactic acidosis Elevated CK Shock -- ? tamponade Leukocytosis, worse  Transaminitis  Hyperkalemia AKI ,worse  ABLA, worse Pericardial effusion  Hypocalcemia  Respiratory failure with hypoxia requiring MV -very concerned for ischemia with above metabolic abnormalities  P -RR incr on vent  3 amp bicarb given, bicarb gtt ordered -4g calcium  -5units insulin + 62m 50%dextrose  -2 PRBC  -CT c/a/p w con, dissection protocol  -adding coags and fibrinogen to prior labs  -trend labs -f/u echo read  Discussed with family at bedside    Additional critical care time 35 minutes   GEliseo GumMSN, AGACNP-BC [place]for pager details   11/30/2020, 2:21 PM

## 2020-11-30 NOTE — H&P (View-Only) (Signed)
Events of late morning are noted.  The patient was doing well this morning however began complaining of chest pressure and pain and ultimately went into cardiac arrest.  He had 8 minutes of chest compressions followed by ROSC.  He was subsequently intubated.  His initial echo was concerning for a new pericardial effusion.  He went on to have a TEE.  This did not show any evidence of aortic dissection.  We then weighed the risks of a CT angiogram, given his renal insufficiency and felt that this was indicated given his current status.  I have reviewed his CT angiogram.  There is no evidence of aortic dissection.  It does not appear that he is in cardiac tamponade.  No pulmonary embolus was seen.  His aortic graft is widely patent as are both profundofemoral arteries and the inferior mesenteric artery.  There was not any excessive blood in the abdomen.  He did have some free air which I feel can most likely be explained by his recent laparotomy.  I do not think that he has intestinal ischemia at this time as his mesenteric vessels are patent and he has not been having diarrhea or excessive NG tube output.  I discussed his condition in detail with his wife and sister.  We plan on starting dialysis today given his creatinine and urine output.  He did appear to have some purposeful movements following his resuscitation.  My suspicion is that this is a primary cardiac event.  I spoke with cardiology who is on board.  We will continue with full support at this time.  I greatly appreciate the assistance of Dr. Tamala Julian with CCM as well as the cardiology team.  Annamarie Major

## 2020-11-30 NOTE — Progress Notes (Signed)
Arterial line attempted x2 by two different RT's without success. MD notified.
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
## 2020-11-30 NOTE — Evaluation (Signed)
Occupational Therapy Evaluation Patient Details Name: Brian Yates MRN: [id number] DOB: 05/28/1941 Today's Date: 11/30/2020    History of Present Illness 80 yo admitted 6/1 for aortobifemoral BPG. PMhx: anemia, gout, glaucoma, HTN, HLD, IBS   Clinical Impression   PTA, pt lives with spouse and reports Independence with ADLs, IADLs and mobility without use of AD. Pt presents now with deficits in strength, endurance, standing balance and cognition. Pt overall Min A for bed mobility and pivot transfers using RW. Pt with noted shakiness in standing and session limited by hypotension during activities. Pt perseverating on difficulty breathing and requires cues to redirect. Pt requires Min A for UB ADLs and Mod A for LB ADLs at this time. Anticipate pt to progress well enough to return home with HHOT follow-up. However, if pt slow to progress and family unable to assist at home, consider SNF for short term rehab.   SpO2 >90% on RA BP supine 116/65, sitting 142/120, standing 96/38, after transfer 108/84    Follow Up Recommendations  Home health OT;Supervision/Assistance - 24 hour (SNF if slow to progress)    Equipment Recommendations  3 in 1 bedside commode;Other (comment) (Rolling walker)    Recommendations for Other Services       Precautions / Restrictions Precautions Precautions: Fall;Other (comment) Precaution Comments: NGT Restrictions Weight Bearing Restrictions: No      Mobility Bed Mobility Overal bed mobility: Needs Assistance Bed Mobility: Rolling;Sidelying to Sit Rolling: Min assist Sidelying to sit: Min assist       General bed mobility comments: HOB 25 degrees with cues for knee flexion, rolling to left and assist to rise and clear legs from surface    Transfers Overall transfer level: Needs assistance Equipment used: Rolling walker (2 wheeled) Transfers: Sit to/from [facility] Sit to Stand: Min assist Stand pivot transfers: Min assist        General transfer comment: cues for hand placement, min assist to rise from bed with pt reporting dizziness in standing with drop in BP with activity limited to pivot to chair with RW. HR up to 135 in standing    Balance Overall balance assessment: Needs assistance Sitting-balance support: No upper extremity supported;Feet supported Sitting balance-Leahy Scale: Fair     Standing balance support: Bilateral upper extremity supported Standing balance-Leahy Scale: Poor Standing balance comment: RW use in standing                           ADL either performed or assessed with clinical judgement   ADL Overall ADL's : Needs assistance/impaired Eating/Feeding: Set up;Sitting   Grooming: Set up;Sitting   Upper Body Bathing: Sitting;Minimal assistance   Lower Body Bathing: Moderate assistance;Sit to/from stand   Upper Body Dressing : Minimal assistance;Sitting   Lower Body Dressing: Moderate assistance;Sit to/from stand Lower Body Dressing Details (indicate cue type and reason): Max A to don socks, unable to cross LEs Toilet Transfer: Minimal assistance;Stand-pivot;RW;BSC Toilet Transfer Details (indicate cue type and reason): simulated to recliner Toileting- Clothing Manipulation and Hygiene: Moderate assistance;Sit to/from stand         General ADL Comments: Limited by hypotension, shakiness in standing and impulsivity     Vision Patient Visual Report: No change from baseline Vision Assessment?: No apparent visual deficits     Perception     Praxis      Pertinent Vitals/Pain Pain Assessment: 0-10 Pain Score: 4  Pain Location: chest Pain Descriptors / Indicators: Aching;Guarding Pain Intervention(s):  Limited activity within patient's tolerance;Monitored during session     Hand Dominance Right   Extremity/Trunk Assessment Upper Extremity Assessment Upper Extremity Assessment: Generalized weakness   Lower Extremity Assessment Lower Extremity  Assessment: Defer to PT evaluation   Cervical / Trunk Assessment Cervical / Trunk Assessment: Other exceptions Cervical / Trunk Exceptions: abdominal guarding, rounded shoulders   Communication Communication Communication: No difficulties   Cognition Arousal/Alertness: Awake/alert Behavior During Therapy: Restless;Impulsive Overall Cognitive Status: Impaired/Different from baseline Area of Impairment: Orientation;Attention;Following commands;Awareness;Problem solving;Safety/judgement                 Orientation Level: Disoriented to;Time Current Attention Level: Selective   Following Commands: Follows one step commands with increased time Safety/Judgement: Decreased awareness of safety;Decreased awareness of deficits Awareness: Emergent Problem Solving: Difficulty sequencing;Requires verbal cues;Requires tactile cues General Comments: Easily distracted and restless. Perseverating on difficulty breathing (though O2 WFL), cues for safety/pacing needed. States date is 5/27 - reoriented appropriately   General Comments  Reports of dizziness with transitional movements, HR up to 135bpm with activity    Exercises     Shoulder Instructions      Home Living Family/patient expects to be discharged to:: Private residence Living Arrangements: Spouse/significant other Available Help at Discharge: Family;Available 24 hours/day Type of Home: House Home Access: Stairs to enter [employer] of Steps: 2 Entrance Stairs-Rails: Right Home Layout: One level     Bathroom Shower/Tub: Teacher, early years/pre: Standard     Home Equipment: Shower seat;Grab bars - tub/shower;Hand held shower head;Cane - single point          Prior Functioning/Environment Level of Independence: Independent        Comments: no use of AD, able to complete ADLs, shares IADLs with wife. Wife typically grocery shops        OT Problem List: Decreased strength;Decreased activity  tolerance;Impaired balance (sitting and/or standing);Decreased cognition;Decreased safety awareness;Decreased knowledge of use of DME or AE      OT Treatment/Interventions: Self-care/ADL training;Therapeutic exercise;DME and/or AE instruction;Therapeutic activities;Patient/family education;Balance training    OT Goals(Current goals can be found in the care plan section) Acute Rehab OT Goals Patient Stated Goal: return home with wife OT Goal Formulation: With patient Time For Goal Achievement: 12/14/20 Potential to Achieve Goals: Good ADL Goals Pt Will Perform Grooming: standing;with set-up Pt Will Perform Lower Body Dressing: with supervision;sit to/from stand;sitting/lateral leans Pt Will Transfer to Toilet: with supervision;ambulating Pt Will Perform Toileting - Clothing Manipulation and hygiene: with supervision;sit to/from stand;sitting/lateral leans  OT Frequency: Min 2X/week   Barriers to D/C:            Co-evaluation PT/OT/SLP Co-Evaluation/Treatment: Yes Reason for Co-Treatment: For patient/therapist safety;To address functional/ADL transfers PT goals addressed during session: Mobility/safety with mobility;Proper use of DME OT goals addressed during session: ADL's and self-care      AM-PAC OT "6 Clicks" Daily Activity     Outcome Measure Help from another person eating meals?: A Little Help from another person taking care of personal grooming?: A Little Help from another person toileting, which includes using toliet, bedpan, or urinal?: A Lot Help from another person bathing (including washing, rinsing, drying)?: A Lot Help from another person to put on and taking off regular upper body clothing?: A Little Help from another person to put on and taking off regular lower body clothing?: A Lot 6 Click Score: 15   End of Session Equipment Utilized During Treatment: Gait belt;Rolling walker Nurse Communication: Mobility status  Activity  Tolerance: Treatment limited  secondary to medical complications (Comment) Patient left: in chair;with call bell/phone within reach;with chair alarm set  OT Visit Diagnosis: Unsteadiness on feet (R26.81);Other abnormalities of gait and mobility (R26.89);Muscle weakness (generalized) (M62.81)                Time: 1007-1040 OT Time Calculation (min): 33 min Charges:  OT General Charges $OT Visit: 1 Visit OT Evaluation $OT Eval Moderate Complexity: 1 Mod  Malachy Chamber, OTR/L Acute Rehab Services Office: [phone]  Layla Maw 11/30/2020, 11:49 AM

## 2020-11-30 NOTE — Progress Notes (Addendum)
Events of late morning are noted.  The patient was doing well this morning however began complaining of chest pressure and pain and ultimately went into cardiac arrest.  He had 8 minutes of chest compressions followed by ROSC.  He was subsequently intubated.  His initial echo was concerning for a new pericardial effusion.  He went on to have a TEE.  This did not show any evidence of aortic dissection.  We then weighed the risks of a CT angiogram, given his renal insufficiency and felt that this was indicated given his current status.  I have reviewed his CT angiogram.  There is no evidence of aortic dissection.  It does not appear that he is in cardiac tamponade.  No pulmonary embolus was seen.  His aortic graft is widely patent as are both profundofemoral arteries and the inferior mesenteric artery.  There was not any excessive blood in the abdomen.  He did have some free air which I feel can most likely be explained by his recent laparotomy.  I do not think that he has intestinal ischemia at this time as his mesenteric vessels are patent and he has not been having diarrhea or excessive NG tube output.  I discussed his condition in detail with his wife and sister.  We plan on starting dialysis today given his creatinine and urine output.  He did appear to have some purposeful movements following his resuscitation.  My suspicion is that this is a primary cardiac event.  I spoke with cardiology who is on board.  We will continue with full support at this time.  I greatly appreciate the assistance of Dr. Tamala Julian with CCM as well as the cardiology team.  Brian Yates

## 2020-11-30 NOTE — Progress Notes (Signed)
   11/30/20 1200  Clinical Encounter Type  Visited With Patient not available  Visit Type Code  Referral From Nurse  Consult/Referral To Chaplain  The chaplain responded to code blue page. The family was present in the lobby. The patient was being stabilized and no chaplain services were needed at this time.

## 2020-11-30 NOTE — Progress Notes (Signed)
  Echocardiogram Echocardiogram Transesophageal has been performed.  Brian Yates M 11/30/2020, 3:45 PM

## 2020-11-30 NOTE — Evaluation (Signed)
Physical Therapy Evaluation Patient Details Name: Brian Yates MRN: [id number] DOB: [birth year] Today's Date: 11/30/2020   History of Present Illness  80 yo admitted 6/1 for aortobifemoral BPG. PMhx: anemia, gout, glaucoma, HTN, HLD, IBS  Clinical Impression  Pt pleasant and willing to get OOB but reports feeling SOB and chest pain despite stable vital signs. Pt on RA throughout session with SPO2 92-97% with HR 110 at rest and up to 135 with pivot transfers. Pt with decreased activity tolerance, mobility and gait limited by hypotension with transfers and will benefit from acute therapy to maximize mobility, safety and function.   BP supine 116/65, sitting 142/120, standing 96/38, in chair 108/84    Follow Up Recommendations Home health PT;Supervision/Assistance - 24 hour    Equipment Recommendations  Rolling walker with 5" wheels    Recommendations for Other Services       Precautions / Restrictions Precautions Precautions: Fall;Other (comment) Precaution Comments: NGT      Mobility  Bed Mobility Overal bed mobility: Needs Assistance Bed Mobility: Rolling;Sidelying to Sit Rolling: Min assist Sidelying to sit: Min assist       General bed mobility comments: HOB 25 degrees with cues for knee flexion, rolling to left and assist to rise and clear legs from surface    Transfers Overall transfer level: Needs assistance   Transfers: Sit to/from Stand Sit to Stand: Min assist         General transfer comment: cues for hand placement, min assist to rise from bed with pt reporting dizziness in standing with drop in BP with activity limited to pivot to chair with RW. HR up to 135 in standing  Ambulation/Gait             General Gait Details: not yet able  Stairs            Wheelchair Mobility    Modified Rankin (Stroke Patients Only)       Balance Overall balance assessment: Needs assistance Sitting-balance support: No upper extremity supported;Feet  supported Sitting balance-Leahy Scale: Fair     Standing balance support: Bilateral upper extremity supported Standing balance-Leahy Scale: Poor Standing balance comment: RW use in standing                             Pertinent Vitals/Pain Pain Assessment: 0-10 Pain Score: 4  Pain Location: chest Pain Descriptors / Indicators: Aching;Guarding Pain Intervention(s): Limited activity within patient's tolerance;Monitored during session;Repositioned    Home Living Family/patient expects to be discharged to:: Private residence Living Arrangements: Spouse/significant other Available Help at Discharge: Family;Available 24 hours/day Type of Home: House Home Access: Stairs to enter   [employer] of Steps: 2 Home Layout: One level Home Equipment: Shower seat;Grab bars - tub/shower;Hand held shower head;Cane - single point      Prior Function Level of Independence: Independent               Hand Dominance        Extremity/Trunk Assessment   Upper Extremity Assessment Upper Extremity Assessment: Defer to OT evaluation    Lower Extremity Assessment Lower Extremity Assessment: Overall WFL for tasks assessed    Cervical / Trunk Assessment Cervical / Trunk Assessment: Other exceptions Cervical / Trunk Exceptions: abdominal guarding, rounded shoulders  Communication   Communication: No difficulties  Cognition Arousal/Alertness: Awake/alert Behavior During Therapy: Restless Overall Cognitive Status: Within Functional Limits for tasks assessed  General Comments      Exercises     Assessment/Plan    PT Assessment Patient needs continued PT services  PT Problem List Decreased strength;Decreased mobility;Decreased activity tolerance;Decreased balance;Decreased knowledge of use of DME;Pain;Cardiopulmonary status limiting activity       PT Treatment Interventions Gait training;Functional  mobility training;Therapeutic activities;Patient/family education;Stair training;Therapeutic exercise;DME instruction;Balance training    PT Goals (Current goals can be found in the Care Plan section)  Acute Rehab PT Goals Patient Stated Goal: return home with wife PT Goal Formulation: With patient Time For Goal Achievement: 12/14/20 Potential to Achieve Goals: Good    Frequency Min 3X/week   Barriers to discharge        Co-evaluation PT/OT/SLP Co-Evaluation/Treatment: Yes Reason for Co-Treatment: Complexity of the patient's impairments (multi-system involvement) PT goals addressed during session: Mobility/safety with mobility;Proper use of DME         AM-PAC PT "6 Clicks" Mobility  Outcome Measure Help needed turning from your back to your side while in a flat bed without using bedrails?: A Little Help needed moving from lying on your back to sitting on the side of a flat bed without using bedrails?: A Little Help needed moving to and from a bed to a chair (including a wheelchair)?: A Little Help needed standing up from a chair using your arms (e.g., wheelchair or bedside chair)?: A Little Help needed to walk in hospital room?: A Lot Help needed climbing 3-5 steps with a railing? : Total 6 Click Score: 15    End of Session Equipment Utilized During Treatment: Gait belt Activity Tolerance: Patient tolerated treatment well Patient left: in chair;with call bell/phone within reach;with chair alarm set Nurse Communication: Mobility status PT Visit Diagnosis: Other abnormalities of gait and mobility (R26.89);Difficulty in walking, not elsewhere classified (R26.2)    Time: 0300-9233 PT Time Calculation (min) (ACUTE ONLY): 32 min   Charges:   PT Evaluation $PT Eval Moderate Complexity: 1 Mod          Jovane Foutz P, PT Acute Rehabilitation Services Pager: [phone] Office: [phone]   Himani Corona B Donasia Wimes 11/30/2020, 11:27 AM

## 2020-11-30 NOTE — Procedures (Signed)
Intubation Procedure Note  Brian Yates  [id number]  09/26/1940  Date:11/30/20  Time:12:50 PM   Provider Performing:Severa Jeremiah C Tamala Julian    Procedure: Intubation (35521)  Indication(s) Respiratory Failure  Consent Unable to obtain consent due to emergent nature of procedure.   Anesthesia Etomidate   Time Out Verified patient identification, verified procedure, site/side was marked, verified correct patient position, special equipment/implants available, medications/allergies/relevant history reviewed, required imaging and test results available.   Sterile Technique Usual hand hygeine, masks, and gloves were used   Procedure Description Patient positioned in bed supine.  Sedation given as noted above.  Patient was intubated with endotracheal tube using Glidescope.  View was Grade 2 only posterior commissure .  Number of attempts was 1.  Colorimetric CO2 detector was consistent with tracheal placement.   Complications/Tolerance None; patient tolerated the procedure well. Chest X-ray is ordered to verify placement.   EBL Minimal   Specimen(s) None

## 2020-11-30 NOTE — Procedures (Signed)
Cardiopulmonary Resuscitation Note  VADA YELLEN  [id number]  26-Nov-1940  Date:11/30/20  Time:12:47 PM   Provider Performing:Seaborn Nakama Cipriano Mile   Procedure: Cardiopulmonary Resuscitation [phone])  Indication(s) Loss of Pulse  Consent N/A  Anesthesia N/A   Time Out N/A   Sterile Technique Hand hygiene, gloves   Procedure Description Called to patient's room for CODE BLUE. Initial rhythm was PEA/Asystole. Patient received high quality chest compressions for 8 minutes with defibrillation or cardioversion when appropriate. Epinephrine was administered every 3 minutes as directed by time Therapist, nutritional. Additional pharmacologic interventions included narcan, atropine and sodium bicarbonate. Return of spontaneous circulation was achieved.  - PEA then junctional, paced and given atropine, slowly turned off pacer with return of afib/RVR  Family to be notified.   Complications/Tolerance N/A   EBL N/A   Specimen(s) N/A  Estimated time to ROSC: 8 minutes

## 2020-11-30 NOTE — Progress Notes (Signed)
Patient slightly hypotensive (SBP <90) and minimal urine output, ~5 mL/hr. Bladder scan shows 30 ml.  Paged Brabham MD and got verbal orders for 1L bolus of fluid and lab draw (CBC). Will continue to monitor throughout shift.

## 2020-12-01 ENCOUNTER — Inpatient Hospital Stay (HOSPITAL_COMMUNITY): Payer: Medicare Other | Admitting: Certified Registered"

## 2020-12-01 ENCOUNTER — Encounter (HOSPITAL_COMMUNITY): Admission: RE | Disposition: E | Payer: Self-pay | Attending: Surgery

## 2020-12-01 DIAGNOSIS — K55031 Focal (segmental) acute (reversible) ischemia of large intestine: Secondary | ICD-10-CM

## 2020-12-01 DIAGNOSIS — Z9911 Dependence on respirator [ventilator] status: Secondary | ICD-10-CM

## 2020-12-01 DIAGNOSIS — D62 Acute posthemorrhagic anemia: Secondary | ICD-10-CM

## 2020-12-01 DIAGNOSIS — E440 Moderate protein-calorie malnutrition: Secondary | ICD-10-CM | POA: Insufficient documentation

## 2020-12-01 DIAGNOSIS — D696 Thrombocytopenia, unspecified: Secondary | ICD-10-CM

## 2020-12-01 DIAGNOSIS — J9602 Acute respiratory failure with hypercapnia: Secondary | ICD-10-CM

## 2020-12-01 DIAGNOSIS — J9601 Acute respiratory failure with hypoxia: Secondary | ICD-10-CM

## 2020-12-01 DIAGNOSIS — I97638 Postprocedural hematoma of a circulatory system organ or structure following other circulatory system procedure: Secondary | ICD-10-CM

## 2020-12-01 DIAGNOSIS — E872 Acidosis: Secondary | ICD-10-CM

## 2020-12-01 DIAGNOSIS — D649 Anemia, unspecified: Secondary | ICD-10-CM

## 2020-12-01 DIAGNOSIS — Y832 Surgical operation with anastomosis, bypass or graft as the cause of abnormal reaction of the patient, or of later complication, without mention of misadventure at the time of the procedure: Secondary | ICD-10-CM

## 2020-12-01 DIAGNOSIS — N179 Acute kidney failure, unspecified: Secondary | ICD-10-CM

## 2020-12-01 HISTORY — PX: LAPAROTOMY: SHX154

## 2020-12-01 HISTORY — PX: LAPAROSCOPIC SUBTOTAL COLECTOMY: SHX5930

## 2020-12-01 LAB — POCT I-STAT EG7
Acid-base deficit: 6 mmol/L — ABNORMAL HIGH (ref 0.0–2.0)
Bicarbonate: 21.3 mmol/L (ref 20.0–28.0)
Calcium, Ion: 1.02 mmol/L — ABNORMAL LOW (ref 1.15–1.40)
HCT: 15 % — ABNORMAL LOW (ref 39.0–52.0)
Hemoglobin: 5.1 g/dL — CL (ref 13.0–17.0)
O2 Saturation: 50 %
Patient temperature: 97.6
Potassium: 3.5 mmol/L (ref 3.5–5.1)
Sodium: 137 mmol/L (ref 135–145)
TCO2: 23 mmol/L (ref 22–32)
pCO2, Ven: 50.7 mmHg (ref 44.0–60.0)
pH, Ven: 7.227 — ABNORMAL LOW (ref 7.250–7.430)
pO2, Ven: 31 mmHg — CL (ref 32.0–45.0)

## 2020-12-01 LAB — POCT I-STAT 7, (LYTES, BLD GAS, ICA,H+H)
Acid-base deficit: 7 mmol/L — ABNORMAL HIGH (ref 0.0–2.0)
Acid-base deficit: 5 mmol/L — ABNORMAL HIGH (ref 0.0–2.0)
Acid-base deficit: 9 mmol/L — ABNORMAL HIGH (ref 0.0–2.0)
Acid-base deficit: 4 mmol/L — ABNORMAL HIGH (ref 0.0–2.0)
Bicarbonate: 19.2 mmol/L — ABNORMAL LOW (ref 20.0–28.0)
Bicarbonate: 18.9 mmol/L — ABNORMAL LOW (ref 20.0–28.0)
Bicarbonate: 21.8 mmol/L (ref 20.0–28.0)
Bicarbonate: 19 mmol/L — ABNORMAL LOW (ref 20.0–28.0)
Calcium, Ion: 0.84 mmol/L — CL (ref 1.15–1.40)
Calcium, Ion: 0.99 mmol/L — ABNORMAL LOW (ref 1.15–1.40)
Calcium, Ion: 1.18 mmol/L (ref 1.15–1.40)
Calcium, Ion: 1.01 mmol/L — ABNORMAL LOW (ref 1.15–1.40)
HCT: 15 % — ABNORMAL LOW (ref 39.0–52.0)
HCT: 18 % — ABNORMAL LOW (ref 39.0–52.0)
HCT: 23 % — ABNORMAL LOW (ref 39.0–52.0)
HCT: 30 % — ABNORMAL LOW (ref 39.0–52.0)
Hemoglobin: 5.1 g/dL — CL (ref 13.0–17.0)
Hemoglobin: 6.1 g/dL — CL (ref 13.0–17.0)
Hemoglobin: 7.8 g/dL — ABNORMAL LOW (ref 13.0–17.0)
Hemoglobin: 10.2 g/dL — ABNORMAL LOW (ref 13.0–17.0)
O2 Saturation: 100 %
O2 Saturation: 100 %
O2 Saturation: 100 %
O2 Saturation: 96 %
Patient temperature: 33.6
Patient temperature: 33.4
Patient temperature: 33.5
Patient temperature: 93.5
Potassium: 3.6 mmol/L (ref 3.5–5.1)
Potassium: 3.8 mmol/L (ref 3.5–5.1)
Potassium: 3.8 mmol/L (ref 3.5–5.1)
Potassium: 3.5 mmol/L (ref 3.5–5.1)
Sodium: 135 mmol/L (ref 135–145)
Sodium: 135 mmol/L (ref 135–145)
Sodium: 138 mmol/L (ref 135–145)
Sodium: 137 mmol/L (ref 135–145)
TCO2: 20 mmol/L — ABNORMAL LOW (ref 22–32)
TCO2: 20 mmol/L — ABNORMAL LOW (ref 22–32)
TCO2: 23 mmol/L (ref 22–32)
TCO2: 20 mmol/L — ABNORMAL LOW (ref 22–32)
pCO2 arterial: 35.7 mmHg (ref 32.0–48.0)
pCO2 arterial: 42.4 mmHg (ref 32.0–48.0)
pCO2 arterial: 44.1 mmHg (ref 32.0–48.0)
pCO2 arterial: 24.4 mmHg — ABNORMAL LOW (ref 32.0–48.0)
pH, Arterial: 7.32 — ABNORMAL LOW (ref 7.350–7.450)
pH, Arterial: 7.236 — ABNORMAL LOW (ref 7.350–7.450)
pH, Arterial: 7.284 — ABNORMAL LOW (ref 7.350–7.450)
pH, Arterial: 7.487 — ABNORMAL HIGH (ref 7.350–7.450)
pO2, Arterial: 484 mmHg — ABNORMAL HIGH (ref 83.0–108.0)
pO2, Arterial: 243 mmHg — ABNORMAL HIGH (ref 83.0–108.0)
pO2, Arterial: 525 mmHg — ABNORMAL HIGH (ref 83.0–108.0)
pO2, Arterial: 66 mmHg — ABNORMAL LOW (ref 83.0–108.0)

## 2020-12-01 LAB — COMPREHENSIVE METABOLIC PANEL
ALT: 640 U/L — ABNORMAL HIGH (ref 0–44)
ALT: 860 U/L — ABNORMAL HIGH (ref 0–44)
AST: 1373 U/L — ABNORMAL HIGH (ref 15–41)
AST: 2384 U/L — ABNORMAL HIGH (ref 15–41)
Albumin: 2.3 g/dL — ABNORMAL LOW (ref 3.5–5.0)
Albumin: 2 g/dL — ABNORMAL LOW (ref 3.5–5.0)
Alkaline Phosphatase: 41 U/L (ref 38–126)
Alkaline Phosphatase: 70 U/L (ref 38–126)
Anion gap: 15 (ref 5–15)
Anion gap: 18 — ABNORMAL HIGH (ref 5–15)
BUN: 20 mg/dL (ref 8–23)
BUN: 17 mg/dL (ref 8–23)
CO2: 17 mmol/L — ABNORMAL LOW (ref 22–32)
CO2: 18 mmol/L — ABNORMAL LOW (ref 22–32)
Calcium: 7.3 mg/dL — ABNORMAL LOW (ref 8.9–10.3)
Calcium: 7.8 mg/dL — ABNORMAL LOW (ref 8.9–10.3)
Chloride: 102 mmol/L (ref 98–111)
Chloride: 100 mmol/L (ref 98–111)
Creatinine, Ser: 2.37 mg/dL — ABNORMAL HIGH (ref 0.61–1.24)
Creatinine, Ser: 1.98 mg/dL — ABNORMAL HIGH (ref 0.61–1.24)
GFR, Estimated: 27 mL/min — ABNORMAL LOW (ref >60)
GFR, Estimated: 34 mL/min — ABNORMAL LOW (ref >60)
Glucose, Bld: 107 mg/dL — ABNORMAL HIGH (ref 70–99)
Glucose, Bld: 79 mg/dL (ref 70–99)
Potassium: 4.3 mmol/L (ref 3.5–5.1)
Potassium: 3.6 mmol/L (ref 3.5–5.1)
Sodium: 134 mmol/L — ABNORMAL LOW (ref 135–145)
Sodium: 136 mmol/L (ref 135–145)
Total Bilirubin: 0.9 mg/dL (ref 0.3–1.2)
Total Bilirubin: 1.2 mg/dL (ref 0.3–1.2)
Total Protein: 3.5 g/dL — ABNORMAL LOW (ref 6.5–8.1)
Total Protein: 3.4 g/dL — ABNORMAL LOW (ref 6.5–8.1)

## 2020-12-01 LAB — BPAM CRYOPRECIPITATE
Blood Product Expiration Date: 202206022117
ISSUE DATE / TIME: 202206021735
Unit Type and Rh: 5100

## 2020-12-01 LAB — PROTIME-INR
INR: 2.3 — ABNORMAL HIGH (ref 0.8–1.2)
INR: 2 — ABNORMAL HIGH (ref 0.8–1.2)
Prothrombin Time: 25.1 seconds — ABNORMAL HIGH (ref 11.4–15.2)
Prothrombin Time: 22.3 seconds — ABNORMAL HIGH (ref 11.4–15.2)

## 2020-12-01 LAB — CBC
HCT: 19.6 % — ABNORMAL LOW (ref 39.0–52.0)
HCT: 32.7 % — ABNORMAL LOW (ref 39.0–52.0)
Hemoglobin: 6.7 g/dL — CL (ref 13.0–17.0)
Hemoglobin: 10.8 g/dL — ABNORMAL LOW (ref 13.0–17.0)
MCH: 30.6 pg (ref 26.0–34.0)
MCH: 29.8 pg (ref 26.0–34.0)
MCHC: 34.2 g/dL (ref 30.0–36.0)
MCHC: 33 g/dL (ref 30.0–36.0)
MCV: 89.5 fL (ref 80.0–100.0)
MCV: 90.1 fL (ref 80.0–100.0)
Platelets: 101 10*3/uL — ABNORMAL LOW (ref 150–400)
Platelets: 42 10*3/uL — ABNORMAL LOW (ref 150–400)
RBC: 2.19 MIL/uL — ABNORMAL LOW (ref 4.22–5.81)
RBC: 3.63 MIL/uL — ABNORMAL LOW (ref 4.22–5.81)
RDW: 15.6 % — ABNORMAL HIGH (ref 11.5–15.5)
RDW: 15.9 % — ABNORMAL HIGH (ref 11.5–15.5)
WBC: 43.5 10*3/uL — ABNORMAL HIGH (ref 4.0–10.5)
WBC: 14.3 10*3/uL — ABNORMAL HIGH (ref 4.0–10.5)
nRBC: 0.2 % (ref 0.0–0.2)
nRBC: 0.4 % — ABNORMAL HIGH (ref 0.0–0.2)

## 2020-12-01 LAB — GLUCOSE, CAPILLARY
Glucose-Capillary: 107 mg/dL — ABNORMAL HIGH (ref 70–99)
Glucose-Capillary: 76 mg/dL (ref 70–99)
Glucose-Capillary: 76 mg/dL (ref 70–99)
Glucose-Capillary: 91 mg/dL (ref 70–99)
Glucose-Capillary: 92 mg/dL (ref 70–99)

## 2020-12-01 LAB — DIC (DISSEMINATED INTRAVASCULAR COAGULATION)PANEL
D-Dimer, Quant: 3.92 ug/mL-FEU — ABNORMAL HIGH (ref 0.00–0.50)
Fibrinogen: 201 mg/dL — ABNORMAL LOW (ref 210–475)
INR: 2.7 — ABNORMAL HIGH (ref 0.8–1.2)
Platelets: 96 10*3/uL — ABNORMAL LOW (ref 150–400)
Prothrombin Time: 28.9 seconds — ABNORMAL HIGH (ref 11.4–15.2)
Smear Review: NONE SEEN
aPTT: 52 seconds — ABNORMAL HIGH (ref 24–36)

## 2020-12-01 LAB — BASIC METABOLIC PANEL
Anion gap: 13 (ref 5–15)
BUN: 13 mg/dL (ref 8–23)
CO2: 20 mmol/L — ABNORMAL LOW (ref 22–32)
Calcium: 7 mg/dL — ABNORMAL LOW (ref 8.9–10.3)
Chloride: 103 mmol/L (ref 98–111)
Creatinine, Ser: 1.65 mg/dL — ABNORMAL HIGH (ref 0.61–1.24)
GFR, Estimated: 42 mL/min — ABNORMAL LOW (ref >60)
Glucose, Bld: 92 mg/dL (ref 70–99)
Potassium: 3.3 mmol/L — ABNORMAL LOW (ref 3.5–5.1)
Sodium: 136 mmol/L (ref 135–145)

## 2020-12-01 LAB — PREPARE CRYOPRECIPITATE: Unit division: 0

## 2020-12-01 LAB — FIBRINOGEN: Fibrinogen: 225 mg/dL (ref 210–475)

## 2020-12-01 LAB — TRIGLYCERIDES: Triglycerides: 148 mg/dL (ref <150)

## 2020-12-01 LAB — MAGNESIUM
Magnesium: 2.2 mg/dL (ref 1.7–2.4)
Magnesium: 2.1 mg/dL (ref 1.7–2.4)

## 2020-12-01 LAB — LACTIC ACID, PLASMA
Lactic Acid, Venous: 9.5 mmol/L (ref 0.5–1.9)
Lactic Acid, Venous: >11 mmol/L (ref 0.5–1.9)

## 2020-12-01 LAB — CK
Total CK: 1869 U/L — ABNORMAL HIGH (ref 49–397)
Total CK: 5628 U/L — ABNORMAL HIGH (ref 49–397)

## 2020-12-01 LAB — PHOSPHORUS
Phosphorus: 4.9 mg/dL — ABNORMAL HIGH (ref 2.5–4.6)
Phosphorus: 6.8 mg/dL — ABNORMAL HIGH (ref 2.5–4.6)

## 2020-12-01 LAB — TROPONIN I (HIGH SENSITIVITY): Troponin I (High Sensitivity): 955 ng/L (ref <18)

## 2020-12-01 LAB — PREPARE RBC (CROSSMATCH)

## 2020-12-01 LAB — COOXEMETRY PANEL
Carboxyhemoglobin: 0.4 % — ABNORMAL LOW (ref 0.5–1.5)
Methemoglobin: 1.1 % (ref 0.0–1.5)
O2 Saturation: 55.5 %
Total hemoglobin: 8.3 g/dL — ABNORMAL LOW (ref 12.0–16.0)

## 2020-12-01 SURGERY — LAPAROTOMY, EXPLORATORY
Anesthesia: General

## 2020-12-01 MED ORDER — METRONIDAZOLE 500 MG/100ML IV SOLN
INTRAVENOUS | Status: DC | PRN
  Administered 2020-12-01: 500 mg via INTRAVENOUS

## 2020-12-01 MED ORDER — SODIUM CHLORIDE 0.9 % IV SOLN
100.0000 mg | INTRAVENOUS | Status: DC
  Administered 2020-12-02 – 2020-12-03 (×2): 100 mg via INTRAVENOUS
  Filled 2020-12-01 (×2): qty 100

## 2020-12-01 MED ORDER — SODIUM CHLORIDE 0.9% IV SOLUTION: Freq: Once | INTRAVENOUS | Status: AC

## 2020-12-01 MED ORDER — IPRATROPIUM-ALBUTEROL 0.5-2.5 (3) MG/3ML IN SOLN
3.0000 mL | Freq: Four times a day (QID) | RESPIRATORY_TRACT | Status: DC
  Administered 2020-12-01 – 2020-12-04 (×12): 3 mL via RESPIRATORY_TRACT
  Filled 2020-12-01 (×12): qty 3

## 2020-12-01 MED ORDER — SODIUM CHLORIDE 0.9 % IV SOLN
INTRAVENOUS | Status: AC
  Filled 2020-12-01: qty 1.2

## 2020-12-01 MED ORDER — CALCIUM GLUCONATE-NACL 1-0.675 GM/50ML-% IV SOLN
1.0000 g | Freq: Once | INTRAVENOUS | Status: AC
  Administered 2020-12-01: 1000 mg via INTRAVENOUS
  Filled 2020-12-01: qty 50

## 2020-12-01 MED ORDER — ALBUMIN HUMAN 25 % IV SOLN
25.0000 g | Freq: Once | INTRAVENOUS | Status: AC
  Administered 2020-12-01: 25 g via INTRAVENOUS
  Filled 2020-12-01: qty 100

## 2020-12-01 MED ORDER — CALCIUM CHLORIDE 10 % IV SOLN
INTRAVENOUS | Status: DC | PRN
  Administered 2020-12-01: .2 g via INTRAVENOUS
  Administered 2020-12-01: .5 g via INTRAVENOUS
  Administered 2020-12-01 (×3): .2 g via INTRAVENOUS
  Administered 2020-12-01: .5 g via INTRAVENOUS
  Administered 2020-12-01: .2 g via INTRAVENOUS

## 2020-12-01 MED ORDER — SODIUM CHLORIDE 0.9 % IV SOLN: INTRAVENOUS | Status: DC | PRN

## 2020-12-01 MED ORDER — LACTATED RINGERS IV SOLN: INTRAVENOUS | Status: DC | PRN

## 2020-12-01 MED ORDER — SODIUM CHLORIDE 0.9% IV SOLUTION: Freq: Once | INTRAVENOUS | Status: DC

## 2020-12-01 MED ORDER — FENTANYL CITRATE (PF) 250 MCG/5ML IJ SOLN
INTRAMUSCULAR | Status: DC | PRN
  Administered 2020-12-01: 100 ug via INTRAVENOUS
  Administered 2020-12-01: 50 ug via INTRAVENOUS
  Administered 2020-12-01: 100 ug via INTRAVENOUS

## 2020-12-01 MED ORDER — METRONIDAZOLE 500 MG/100ML IV SOLN
500.0000 mg | Freq: Three times a day (TID) | INTRAVENOUS | Status: DC
  Administered 2020-12-01 – 2020-12-04 (×8): 500 mg via INTRAVENOUS
  Filled 2020-12-01 (×8): qty 100

## 2020-12-01 MED ORDER — FENTANYL CITRATE (PF) 100 MCG/2ML IJ SOLN: INTRAMUSCULAR | Status: DC | PRN

## 2020-12-01 MED ORDER — 0.9 % SODIUM CHLORIDE (POUR BTL) OPTIME
TOPICAL | Status: DC | PRN
  Administered 2020-12-01 (×2): 1000 mL
  Administered 2020-12-01: 2000 mL
  Administered 2020-12-01: 1000 mL

## 2020-12-01 MED ORDER — SODIUM CHLORIDE 0.9% IV SOLUTION
Freq: Once | INTRAVENOUS | Status: AC
Start: 2020-12-01 — End: 2020-12-01

## 2020-12-01 MED ORDER — NOREPINEPHRINE 16 MG/250ML-% IV SOLN
0.0000 ug/min | INTRAVENOUS | Status: DC
  Administered 2020-12-01: 20 ug/min via INTRAVENOUS
  Administered 2020-12-02: 12 ug/min via INTRAVENOUS
  Administered 2020-12-03: 18 ug/min via INTRAVENOUS
  Filled 2020-12-01 (×3): qty 250

## 2020-12-01 MED ORDER — ROCURONIUM BROMIDE 10 MG/ML (PF) SYRINGE
PREFILLED_SYRINGE | INTRAVENOUS | Status: DC | PRN
  Administered 2020-12-01 (×3): 50 mg via INTRAVENOUS

## 2020-12-01 MED ORDER — INSULIN ASPART 100 UNIT/ML IJ SOLN: 1.0000 [IU] | INTRAMUSCULAR | Status: DC

## 2020-12-01 MED ORDER — HEPARIN SODIUM (PORCINE) 5000 UNIT/ML IJ SOLN
5000.0000 [IU] | Freq: Three times a day (TID) | INTRAMUSCULAR | Status: DC
  Administered 2020-12-02 – 2020-12-03 (×6): 5000 [IU] via SUBCUTANEOUS
  Filled 2020-12-01 (×6): qty 1

## 2020-12-01 MED ORDER — SODIUM CHLORIDE 0.9 % IV SOLN
200.0000 mg | Freq: Once | INTRAVENOUS | Status: AC
  Administered 2020-12-01: 200 mg via INTRAVENOUS
  Filled 2020-12-01 (×2): qty 200

## 2020-12-01 SURGICAL SUPPLY — 63 items
ADH SKN CLS APL DERMABOND .7 (GAUZE/BANDAGES/DRESSINGS) ×2
APPLIER CLIP 11 MED OPEN (CLIP) ×3
APR CLP MED 11 20 MLT OPN (CLIP) ×2
CLIP APPLIE 11 MED OPEN (CLIP) ×2 IMPLANT
CLIP VESOCCLUDE MED 24/CT (CLIP) ×3 IMPLANT
CLIP VESOCCLUDE SM WIDE 24/CT (CLIP) ×3 IMPLANT
COVER BACK TABLE 60X90IN (DRAPES) ×3 IMPLANT
COVER WAND RF STERILE (DRAPES) ×3 IMPLANT
DERMABOND ADVANCED (GAUZE/BANDAGES/DRESSINGS) ×1
DERMABOND ADVANCED .7 DNX12 (GAUZE/BANDAGES/DRESSINGS) ×2 IMPLANT
DRAPE INCISE IOBAN 66X45 STRL (DRAPES) ×3 IMPLANT
DRSG COVADERM 4X14 (GAUZE/BANDAGES/DRESSINGS) ×1 IMPLANT
DRSG COVADERM 4X6 (GAUZE/BANDAGES/DRESSINGS) ×1 IMPLANT
ELECT BLADE 4.0 EZ CLEAN MEGAD (MISCELLANEOUS)
ELECTRODE BLDE 4.0 EZ CLN MEGD (MISCELLANEOUS) IMPLANT
GLOVE BIOGEL PI IND STRL 7.5 (GLOVE) ×2 IMPLANT
GLOVE BIOGEL PI INDICATOR 7.5 (GLOVE) ×1
GLOVE SURG POLYISO LF SZ7.5 (GLOVE) ×3 IMPLANT
GOWN STRL REUS W/ TWL LRG LVL3 (GOWN DISPOSABLE) ×4 IMPLANT
GOWN STRL REUS W/ TWL XL LVL3 (GOWN DISPOSABLE) ×2 IMPLANT
GOWN STRL REUS W/TWL LRG LVL3 (GOWN DISPOSABLE) ×6
GOWN STRL REUS W/TWL XL LVL3 (GOWN DISPOSABLE) ×3
HANDLE SUCTION POOLE (INSTRUMENTS) IMPLANT
INSERT FOGARTY 61MM (MISCELLANEOUS) IMPLANT
INSERT FOGARTY SM (MISCELLANEOUS) IMPLANT
KIT OSTOMY DRAINABLE 2.75 STR (WOUND CARE) ×1 IMPLANT
KIT TURNOVER KIT B (KITS) ×3 IMPLANT
LIGASURE IMPACT 36 18CM CVD LR (INSTRUMENTS) ×1 IMPLANT
LOOP VESSEL MAXI BLUE (MISCELLANEOUS) ×3 IMPLANT
LOOP VESSEL MINI RED (MISCELLANEOUS) ×3 IMPLANT
PAD ARMBOARD 7.5X6 YLW CONV (MISCELLANEOUS) ×6 IMPLANT
RELOAD PROXIMATE 75MM BLUE (ENDOMECHANICALS) ×3 IMPLANT
RELOAD STAPLE 75 3.8 BLU REG (ENDOMECHANICALS) IMPLANT
SPONGE LAP 18X18 RF (DISPOSABLE) IMPLANT
SPONGE LAP 4X18 RFD (DISPOSABLE) IMPLANT
SPONGE SURGIFOAM ABS GEL 100 (HEMOSTASIS) IMPLANT
STAPLER GUN LINEAR PROX 60 (STAPLE) ×1 IMPLANT
STAPLER PROXIMATE 75MM BLUE (STAPLE) ×1 IMPLANT
STAPLER SKIN 35 WIDE (STAPLE) ×2 IMPLANT
SUCTION POOLE HANDLE (INSTRUMENTS) ×3
SUT MNCRL AB 4-0 PS2 18 (SUTURE) ×3 IMPLANT
SUT PDS AB 1 CTX 36 (SUTURE) ×3 IMPLANT
SUT PDS AB 1 TP1 54 (SUTURE) ×2 IMPLANT
SUT PROLENE 4 0 RB 1 (SUTURE) ×12
SUT PROLENE 4-0 RB1 .5 CRCL 36 (SUTURE) ×8 IMPLANT
SUT PROLENE 5 0 CC1 (SUTURE) IMPLANT
SUT PROLENE 6 0 C 1 30 (SUTURE) ×3 IMPLANT
SUT PROLENE 6 0 CC (SUTURE) IMPLANT
SUT SILK 0 TIES 10X30 (SUTURE) ×3 IMPLANT
SUT SILK 2 0 TIES 10X30 (SUTURE) ×3 IMPLANT
SUT SILK 2 0SH CR/8 30 (SUTURE) IMPLANT
SUT SILK 3 0 TIES 10X30 (SUTURE) ×3 IMPLANT
SUT SILK 3 0SH CR/8 30 (SUTURE) IMPLANT
SUT VIC AB 0 CT1 27 (SUTURE) ×3
SUT VIC AB 0 CT1 27XBRD ANBCTR (SUTURE) ×2 IMPLANT
SUT VIC AB 2-0 CT1 27 (SUTURE) ×3
SUT VIC AB 2-0 CT1 TAPERPNT 27 (SUTURE) ×2 IMPLANT
SUT VIC AB 3-0 SH 18 (SUTURE) ×1 IMPLANT
SUT VIC AB 3-0 SH 27 (SUTURE) ×3
SUT VIC AB 3-0 SH 27X BRD (SUTURE) ×2 IMPLANT
SYR TOOMEY 50ML (SYRINGE) ×1 IMPLANT
TOWEL GREEN STERILE (TOWEL DISPOSABLE) ×3 IMPLANT
TOWEL SURG RFD BLUE STRL DISP (DISPOSABLE) ×1 IMPLANT

## 2020-12-01 NOTE — Progress Notes (Signed)
VASCULAR SURGERY:  The patient is 2 days status post aortofemoral bypass grafting.  He had cardiac arrest yesterday and is critically ill.  Exploratory laparotomy was recommended in order to rule out ischemic bowel.  I have discussed the indications for the procedure with the wife and she is agreeable to proceed.  I have explained that there is a chance that if he has ischemic bowel he would require bowel resection and possible ostomy.  Gae Gallop, MD 2:23 PM

## 2020-12-01 NOTE — Progress Notes (Addendum)
[facility] Progress Note Patient Name: Brian Yates DOB: 06/19/1941 MRN: [id number]   Date of Service  12/03/2020  HPI/Events of Note  Patient is having flow problems with his dialysis catheter, it has been checked earlier by Montey Hora of the PCCM ground crew and found to be properly inserted and readily amenable to aspiration of blood.  eICU Interventions  Patient given 25 gm of 25 % Albumin iv x 1 to r/o relative hypovolemia causing the dialysis catheter to collapse within the sub-optimally expanded blood vessel.        Kerry Kass Alvita Fana 11/30/2020, 1:27 AM

## 2020-12-01 NOTE — Progress Notes (Signed)
eLink Physician-Brief Progress Note Patient Name: Brian Yates DOB: 07-19-1940 MRN: [id number]   Date of Service  12/18/2020  HPI/Events of Note  Multiple issues: 1. INR = 2.0 and ionized Ca++ = 1.01. CMP in process.   eICU Interventions  Plan: 1. Transfuse 2 units FFP. 2. Replace Ca++.      Intervention Category Intermediate Interventions: Electrolyte abnormality - evaluation and management;Other:  Lysle Dingwall 12/13/2020, 7:45 PM

## 2020-12-01 NOTE — Anesthesia Preprocedure Evaluation (Addendum)
Anesthesia Evaluation  Patient identified by MRN, date of birth, ID band Patient unresponsive    Reviewed: Allergy & Precautions, Patient's Chart, lab work & pertinent test results  Airway Mallampati: Trach   Neck ROM: Full    Dental  (+) Edentulous Upper, Edentulous Lower   Pulmonary COPD,  COPD inhaler, Current Smoker and Patient abstained from smoking.,    breath sounds clear to auscultation + decreased breath sounds      Cardiovascular hypertension, Pt. on home beta blockers + CAD, + Past MI and + Peripheral Vascular Disease  + Valvular Problems/Murmurs AS  Rhythm:Regular Rate:Tachycardia  ECHO: 1. Left ventricular ejection fraction, by estimation, is 60 to 65%. The left ventricle has normal function. The left ventricle has no regional wall motion abnormalities. There is moderate left ventricular hypertrophy. Left ventricular diastolic parameters are consistent with Grade I diastolic dysfunction (impaired relaxation). 2. The mitral valve is abnormal. Trivial mitral valve regurgitation. 3. The aortic valve is tricuspid - leaflets are thickened and minimally restricted in opening. Aortic valve regurgitation is trivial. Very mild aortic stenosis. Aortic valve mean gradient measures 12 mmHg, AVA 2.34 cm2 based on an LVOT diameter of 2.2 cm. DI is 0.62. 4. Right ventricular systolic function is normal. The right ventricular size is normal. Tricuspid regurgitation signal is inadequate for assessing PA pressure. Comparison(s): Changes from prior study are noted.   Myocardial perfusion: Nuclear stress EF: 55%. The left ventricular ejection fraction is normal (55-65%). There was no ST segment deviation noted during stress. Defect 1: There is a medium defect of moderate severity present in the basal inferior, mid inferior and apical inferior location. The study is normal. This is a low risk study.     Neuro/Psych PSYCHIATRIC  DISORDERS  Neuromuscular disease    GI/Hepatic (+) Hepatitis -Irritable bowel syndrome   Endo/Other  negative endocrine ROS  Renal/GU negative Renal ROS     Musculoskeletal  (+) Arthritis ,   Abdominal   Peds  Hematology  (+) anemia , HLD   Anesthesia Other Findings Claudication  Reproductive/Obstetrics                            Anesthesia Physical  Anesthesia Plan  ASA: IV and emergent  Anesthesia Plan: General   Post-op Pain Management:    Induction: Intravenous  PONV Risk Score and Plan: 1 and Ondansetron, Dexamethasone and Treatment may vary due to age or medical condition  Airway Management Planned: Oral ETT  Additional Equipment: Arterial line and CVP  Intra-op Plan:   Post-operative Plan:   Informed Consent: I have reviewed the patients History and Physical, chart, labs and discussed the procedure including the risks, benefits and alternatives for the proposed anesthesia with the patient or authorized representative who has indicated his/her understanding and acceptance.       Plan Discussed with: CRNA and Anesthesiologist  Anesthesia Plan Comments:        Anesthesia Quick Evaluation

## 2020-12-01 NOTE — Progress Notes (Signed)
Initial Nutrition Assessment  DOCUMENTATION CODES:   Non-severe (moderate) malnutrition in context of chronic illness  INTERVENTION:   If unable to extubate within the next 24 hours and supportive care continues, recommend begin nutrition support:   If able to use gut for nutrition, recommend Vital 1.5 at 60 ml/h via NG tube with Prosource TF 45 ml QID to provide 2320 kcal, 141 gm protein, 1100 ml free water daily.  If unable to safely use gut for nutrition, recommend begin TPN.   NUTRITION DIAGNOSIS:   Moderate Malnutrition related to chronic illness (chronic obstructive lung disease) as evidenced by mild muscle depletion,moderate muscle depletion,mild fat depletion.  GOAL:   Patient will meet greater than or equal to 90% of their needs  MONITOR:   Vent status,Labs,I & O's  REASON FOR ASSESSMENT:   Ventilator    ASSESSMENT:   80 yo male admitted with severe bilateral claudication. S/P aorto bifemoral bypass on 6/1. S/P PEA arrest 6/2 and required intubation. PMH includes aortic valve stenosis, HTN, HLD, lung cancer, coronary artery calcification, hepatitis B, severe PAD, alcohol abuse, skin cancer, diverticulosis, IBS, tobacco abuse, B12 deficiency, chronic obstructive lung disease.   CRRT initiated 6/2 for AKI with acidosis and hyperkalemia.  566 ml removed x 24 hours. Remains on vent support. Returning to OR this afternoon for exploratory laparotomy to rule out ischemic bowel.   Patient is currently intubated on ventilator support MV: 18.4 L/min Temp (24hrs), Avg:96.5 F (35.8 C), Min:93.3 F (34.1 C), Max:98 F (36.7 C)   Labs reviewed. K 3.3 CBG: 107-76  Medications reviewed and include Novolog, Protonix, Levophed. IVF: sodium bicarb in sterile water at 125 ml/h.   Usual weights reviewed. No significant weight changes noted. 66.9 kg Feb 2022 64.9 kg on admission  Weight up to 69.6 kg today r/t fluid retention. I/O +10 L since admission UOP 60 ml x 24  hours  Patient meets criteria for moderate malnutrition with mild-moderate depletion of muscle and subcutaneous fat mass.  NUTRITION - FOCUSED PHYSICAL EXAM:  Flowsheet Row Most Recent Value  Orbital Region Mild depletion  Upper Arm Region Moderate depletion  Thoracic and Lumbar Region Mild depletion  Buccal Region Unable to assess  Temple Region Unable to assess  Clavicle Bone Region Moderate depletion  Clavicle and Acromion Bone Region Mild depletion  Scapular Bone Region Mild depletion  Dorsal Hand No depletion  Patellar Region Moderate depletion  Anterior Thigh Region Moderate depletion  Posterior Calf Region Moderate depletion  Edema (RD Assessment) Mild  Hair Reviewed  Eyes Unable to assess  Mouth Unable to assess  Skin Reviewed  Nails Reviewed       Diet Order:   Diet Order            Diet NPO time specified  Diet effective now                 EDUCATION NEEDS:   Not appropriate for education at this time  Skin:  Skin Assessment: Reviewed RN Assessment  Last BM:  no BM documented  Height:   Ht Readings from Last 1 Encounters:  12/28/2020 5\' 8"  (1.727 m)    Weight:   Wt Readings from Last 1 Encounters:  12/23/2020 69.6 kg    Ideal Body Weight:     BMI:  Body mass index is 23.33 kg/m.  Estimated Nutritional Needs:   Kcal:  2100-2400  Protein:  140-160 gm  Fluid:  2.1-2.2 L    Lucas Mallow, RD, LDN, CNSC Please  refer to [facility] for contact information.

## 2020-12-01 NOTE — Progress Notes (Addendum)
Progress Note    12/16/2020 7:59 AM 2 Days Post-Op  Subjective:  Intubated and sedated on vent   Vitals:   12/26/2020 0700 12/02/2020 0740  BP: 100/71 92/80  Pulse: (!) 109 (!) 106  Resp: (!) 30 (!) 30  Temp:  97.6 F (36.4 C)  SpO2: 100%    Physical Exam: Cardiac:  tachycardic Lungs: on vent Incisions:  Laparotomy and femoral incisions are clean dry and intact without swelling or hematoma Extremities:  2+ femoral pulses and popliteal pulses, no palpable or doppler signals bilaterally Abdomen: soft, non distended Neurologic: sedated   CBC    Component Value Date/Time   WBC 43.5 (H) 12/16/2020 0513   RBC 2.19 (L) 12/21/2020 0513   HGB 6.7 (LL) 11/30/2020 0513   HCT 19.6 (L) 12/02/2020 0513   PLT 101 (L) 12/03/2020 0513   MCV 89.5 12/25/2020 0513   MCH 30.6 12/26/2020 0513   MCHC 34.2 12/03/2020 0513   RDW 15.6 (H) 12/26/2020 0513    BMET    Component Value Date/Time   NA 134 (L) 12/18/2020 0513   K 4.3 12/14/2020 0513   CL 102 12/10/2020 0513   CO2 17 (L) 12/25/2020 0513   GLUCOSE 107 (H) 12/16/2020 0513   BUN 20 12/26/2020 0513   CREATININE 2.37 (H) 12/13/2020 0513   CALCIUM 7.3 (L) 12/02/2020 0513   GFRNONAA 27 (L) 12/11/2020 0513    INR    Component Value Date/Time   INR 1.9 (H) 11/30/2020 2157     Intake/Output Summary (Last 24 hours) at 12/28/2020 0759 Last data filed at 12/28/2020 0700 Gross per 24 hour  Intake 5002.8 ml  Output 626 ml  Net 4376.8 ml     Assessment/Plan:  80 y.o. male is s/p    #1: Aorto bifemoral bypass                         #2: Reimplantation of the inferior mesenteric artery                         #3: Bilateral common femoral and profundofemoral endarterectomy                         #4: Primary repair of small umbilical hernia  2 Days Post-Op. Had PEA arrest yesterday and had 8 minutes of CPR followed by ROSC. Appreciate CCM assistance  General: intubated and sedated on vent Vascular: 2+ femoral pulses, palpable  popliteal pulses bilaterally, no doppler signals bilaterally. Bilateral feet warm Cardiac: DIC panel, trending cardiac enzymes, EKG. Echo was initially concerning for new pericardial effusion/ tamponade. TEE did not show any dissection. No PE. Does not appear to be STEMI/NSTEMI. Appreciate CCM/ Cardiology assistance Pulmonary: intubated Neuro: sedated Renal: Diminished UOP, Scr 2.37. AKI started on CRRT for multiple Metabolic abnormalities. HD per Nephrology GI: NG tube to suction. Had BM yesterday with some concerns about malodor and ischemic bowel? May have to return to OR for exploration  Incisions: laparotomy incision and bilateral femoral incisions are clean, dry and intact without swelling or hematoma Heme: Acute surgical blood loss anemia. Hgb 6.7 down from 8.7. Getting 1 unit PRBC, I unit FFP CT with no evidence of blood in abdomen  Karoline Caldwell, PA-C [facility] [phone] 12/12/2020 7:59 AM   I agree with the above.  I have seen and examined the patient.  He remains critically ill.  I saw  him multiple times throughout the morning trying to determine the exact etiology of his decline.  I had extensive conversations with Dr. Stanford Breed of cardiology and critical care.  At this point, I cannot exclude mesenteric ischemia and feel that the patient needs to go back to the operating room for abdominal exploration.  I discussed this with the patient's sister and his wife.  They understand the gravity of the situation and the need to go back to the operating room for exploration.  Annamarie Major

## 2020-12-01 NOTE — Consult Note (Addendum)
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 Tricuspid regurgitation signal is inadequate for assessing PA pressure. Left Atrium: Left atrial size was normal in size. Right Atrium: Right atrial size was normal in size. Pericardium: There is no evidence of pericardial effusion. Mitral Valve: The mitral valve is abnormal. There is mild thickening of the mitral valve leaflet(s). Mild to moderate mitral annular calcification. Trivial mitral valve regurgitation. Tricuspid Valve: The tricuspid valve is grossly normal. Tricuspid valve regurgitation is trivial. Aortic Valve: The aortic valve is tricuspid. Aortic valve regurgitation is trivial. Aortic regurgitation PHT measures 359 msec. Mild to moderate aortic valve sclerosis/calcification is present, without any evidence of  aortic stenosis. Aortic valve mean gradient measures 12.0 mmHg. Aortic valve peak gradient measures 24.2 mmHg. Aortic valve area, by VTI measures 2.34 cm. Pulmonic Valve: The pulmonic valve was normal in structure. Pulmonic valve regurgitation is not visualized. Aorta: The aortic root and ascending aorta are structurally normal, with no evidence of dilitation. IAS/Shunts: No atrial level shunt detected by color flow Doppler.  LEFT VENTRICLE PLAX 2D LVIDd:         4.20 cm  Diastology LVIDs:         2.80 cm  LV e' medial:    6.64 cm/s LV PW:         1.30 cm  LV E/e' medial:  12.9 LV IVS:        1.30 cm  LV e' lateral:   7.62 cm/s LVOT diam:     2.20 cm  LV E/e' lateral: 11.3 LV SV:         106 LV SV Index:   61       2D Longitudinal Strain LVOT Area:     3.80 cm 2D Strain GLS (A2C):   -20.3 %                         2D Strain GLS (A3C):   -23.6 %                         2D Strain GLS (A4C):   -22.7 %  2D Strain GLS Avg:     -22.2 %                          3D Volume EF:                         3D EF:        57 %                         LV EDV:       141 ml                         LV ESV:       60 ml                         LV SV:        81 ml RIGHT VENTRICLE RV Basal diam:  3.50 cm RV S prime:     14.10 cm/s TAPSE (M-mode): 2.6 cm LEFT ATRIUM             Index       RIGHT ATRIUM           Index LA diam:        4.70 cm 2.69 cm/m  RA Area:     16.70 cm LA Vol (A2C):   55.6 ml 31.77 ml/m RA Volume:   47.30 ml  27.03 ml/m LA Vol (A4C):   33.7 ml 19.26 ml/m LA Biplane Vol: 46.5 ml 26.57 ml/m  AORTIC VALVE AV Area (Vmax):    1.98 cm AV Area (Vmean):   1.95 cm AV Area (VTI):     2.34 cm AV Vmax:           246.00 cm/s AV Vmean:          164.000 cm/s AV VTI:            0.454 m AV Peak Grad:      24.2 mmHg AV Mean Grad:      12.0 mmHg LVOT Vmax:         128.00 cm/s LVOT Vmean:        84.000 cm/s LVOT VTI:          0.280 m LVOT/AV VTI ratio: 0.62 AI PHT:            359 msec  AORTA Ao Root diam:  4.00 cm MITRAL VALVE                TRICUSPID VALVE MV Area (PHT): 2.62 cm     TR Peak grad:   16.8 mmHg MV Decel Time: 289 msec     TR Vmax:        205.00 cm/s MV E velocity: 85.90 cm/s MV A velocity: 135.00 cm/s  SHUNTS MV E/A ratio:  0.64         Systemic VTI:  0.28 m                             Systemic Diam: 2.20 cm Lyman Bishop MD Electronically signed by Lyman Bishop MD Signature Date/Time: 11/28/2020/5:42:27 PM    Final    ECHO TEE  Result Date: 11/30/2020    TRANSESOPHOGEAL ECHO REPORT   Patient Name:   Brian Yates Date of Exam:  11/30/2020 Medical Rec #:  [id number]        Height:       68.0 in Accession #:    [id number]       Weight:       138.2 lb Date of Birth:  August 04, 1940        BSA:          1.747 m Patient Age:    58 years         BP:           132/79 mmHg Patient Gender: M                HR:           135 bpm. Exam Location:  Inpatient Procedure: Transesophageal Echo, Cardiac Doppler and Color Doppler Indications:     Thoracic aortic aneurysm, I71.1  History:         Patient has prior history of Echocardiogram examinations, most                  recent 11/30/2020. Aortic Valve Disease; Risk Factors:Current                  Smoker, Dyslipidemia and Hypertension. Aortobifemoral bypass                  grafting with reimplantation of IMA.  Sonographer:     Darlina Sicilian RDCS Referring Phys:  [id number] [facility] A Brian Yates Diagnosing Phys: Rudean Haskell MD PROCEDURE: After discussion of the risks and benefits of a TEE, an informed consent was obtained from a family member. The patient was intubated. TEE procedure time was 38 minutes. The transesophogeal probe was passed without difficulty through the esophogus of the patient. Imaged were obtained with the patient in a supine position. Sedation performed by different physician. Patients was under conscious sedation during this procedure. Anesthetic administered: 6mg of Fentanyl. Image quality was technically difficult. The patient's vital  signs; including heart rate, blood pressure, and oxygen saturation; remained stable throughout the procedure. The patient developed no complications during the procedure. IMPRESSIONS  1. Moderate pericardial effusion. The pericardial effusion is anterior to the right ventricle. There is significant change in respirophasic valvular inflow velocities; coronary sinus diameter 8 mm, no RV or RA collapse suggestive of increase pericardial  pressure.  2. 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 In the clinic syndrome of pressor administration and with increase in all valvular gradients, less suggestive of anatomic obstruction and favored to be related to a high flow state.  3. Left ventricular ejection fraction, by estimation, is 70 to 75%. The left ventricle has hyperdynamic function. The left ventricle has no regional wall motion abnormalities. There is severe concentric left ventricular hypertrophy.  4. Right ventricular systolic function is hyperdynamic. The right ventricular size is normal.  5. No left atrial/left atrial appendage thrombus was detected.  6. The mitral valve is abnormal. No evidence of mitral valve regurgitation.  7. The aortic valve is tricuspid. There is mild calcification of the aortic valve. There is moderate thickening of the aortic valve. Aortic valve regurgitation is trivial.  8. Aortic dilatation noted. There is Moderate (Grade III) plaque involving the descending aorta.  9. Emergent study done without evidence of interatrial communication- aggitated saline contrast deferred. FINDINGS  Left Ventricle: Left ventricular ejection fraction, by estimation, is 70 to 75%. The left ventricle has hyperdynamic function. The left ventricle has no  regional wall motion abnormalities. The left ventricular internal cavity size was small. There is severe concentric left ventricular hypertrophy. Right Ventricle: The right  ventricular size is normal. Right vetricular wall thickness was not assessed. Right ventricular systolic function is hyperdynamic. Left Atrium: Left atrial size was normal in size. No left atrial/left atrial appendage thrombus was detected. Right Atrium: Right atrial size was normal in size. Pericardium: A moderately sized pericardial effusion is present. The pericardial effusion is anterior to the right ventricle. There is no evidence of cardiac tamponade. Mitral Valve: The mitral valve is abnormal. There is mild thickening of the mitral valve leaflet(s). Mild mitral annular calcification. No evidence of mitral valve regurgitation. Tricuspid Valve: The tricuspid valve is grossly normal. Tricuspid valve regurgitation is not demonstrated. Aortic Valve: The aortic valve is tricuspid. There is mild calcification of the aortic valve. There is moderate thickening of the aortic valve. There is mild to moderate aortic valve annular calcification. Aortic valve regurgitation is trivial. Pulmonic Valve: The pulmonic valve was grossly normal. Pulmonic valve regurgitation is not visualized. 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 Aorta: Aortic dilatation noted and the aortic root and ascending aorta are structurally normal, with no evidence of dilitation. There is moderate (Grade III) plaque involving the descending aorta. IAS/Shunts: There is left bowing of the interatrial septum, suggestive of elevated right atrial pressure. The atrial septum is grossly normal.   AORTA Ao Root diam: 3.80 cm Ao Asc diam:  2.80 cm MITRAL VALVE MV Area (PHT): 5.97 cm MV Decel Time: 127 msec MV E velocity: 194.00 cm/s Rudean Haskell MD Electronically signed by Rudean Haskell MD Signature Date/Time: 11/30/2020/6:59:58 PM    Final    CT Angio Chest/Abd/Pel for Dissection W and/or W/WO  Result Date: 11/30/2020 CLINICAL DATA:  Abdominal pain. Chest pain. Hypoactive bowel, cardiac arrest. Postop day 1 from aorto bifem bypass. EXAM: CT  ANGIOGRAPHY CHEST, ABDOMEN AND PELVIS TECHNIQUE: Non-contrast CT of the chest was initially obtained. Multidetector CT imaging through the chest, abdomen and pelvis was performed using the standard protocol during bolus administration of intravenous contrast. Multiplanar reconstructed images and MIPs were obtained and reviewed to evaluate the vascular anatomy. CONTRAST:  60m OMNIPAQUE IOHEXOL 350 MG/ML SOLN COMPARISON:  Radiographs earlier today. Abdominopelvic CTA 11/16/2020, chest CT 11/03/2020 FINDINGS: CTA CHEST FINDINGS Cardiovascular: Advanced aortic atherosclerosis. This includes irregular plaque involving the distal thoracic aorta which is peripherally calcified, not significantly changed from prior exam. No aortic hematoma. No aortic aneurysm, dissection, or evidence of acute aortic syndrome. Conventional branching pattern from the aortic arch with patent branch vessels. No central pulmonary embolus to the segmental level. Heart is normal in size. Small pericardial effusion measures up to 17 mm anteriorly. Left internal jugular venous catheter courses into the hemi azygous system in terminates just above the thoracic aorta. There is no adjacent perivascular stranding. Mediastinum/Nodes: Enteric tube within the esophagus which is patulous and air-filled. Endotracheal tube tip is above the carina. Minimal mucus within the distal trachea and right mainstem bronchus. No mediastinal or hilar adenopathy. No suspicious thyroid nodule. Lungs/Pleura: Apical predominant emphysema, at least moderate in degree. Spiculated nodule in the lingula measures 15 x 12 mm, series 8, image 72, not significantly changed. Adjacent ground-glass opacity in the lingula is stable. Smaller adjacent ground-glass nodule on prior CT is not significantly changed, including 7 mm on series 8, image 68. Two adjacent subpleural pulmonary nodules in the right lower lobe, largest measuring 7 mm series 8, image 57 again seen. There is a new  subpleural nodule in the right lower lobe measuring 5 mm, series 8, image 77. There are small bilateral pleural effusions with adjacent compressive atelectasis. No pneumothorax. No findings of pulmonary edema. Minimal retained mucus in the distal trachea and right mainstem bronchus. Musculoskeletal: Cortical buckling of the sternal body is new from prior and likely related to CPR. No rib fractures. Lucency in the anterior left glenoid may represent a subchondral cyst, but is nonspecific, series 8, image 5 Review of the MIP images confirms the above findings. CTA ABDOMEN AND PELVIS FINDINGS VASCULAR Aorta: Advanced aortic atherosclerosis including calcified noncalcified plaque in the upper abdominal aorta projecting into the aortic lumen. No upper abdominal stenosis. Aorto bifem graft with adjacent air and stranding, likely related to surgery yesterday. There is no evidence of active extravasation or aortic leak. Aortic graft is patent. Ill-defined air in fluid adjacent to the upper aspect of the graft native distal aorta is densely calcified. Celiac: Plaque at the origin causes mild to moderate stenosis with poststenotic dilatation. Celiac dilatation of 10 mm just proximal to the hepatic splenic bifurcation. The hepatic artery is diminutive and not well opacified. SMA: Patent with heavily calcified plaque proximally. Distal branches are diminutive. No evidence of thrombus or embolic disease. No severe stenosis proximally. Renals: Patent with moderate plaque. Distal branches are diminutive. IMA: Patent arising from the bypass graft. Inflow: Bi femoral bypass graft is patent without stenosis or dissection. Air and stranding adjacent to the graft is likely related to recent surgery. Veins: Unopacified and not well assessed. There is no portal venous or mesenteric gas. Review of the MIP images confirms the above findings. NON-VASCULAR Hepatobiliary: No obvious focal abnormality on arterial phase imaging. Distended  gallbladder without inflammation or calcified stone. Pancreas: No ductal dilatation or inflammation. Spleen: Calcified granuloma. Normal in size. Adrenals/Urinary Tract: No adrenal nodule. Slightly hyperdense adrenal glands. Heterogeneous with suspected bilateral cysts, not well assessed on this arterial phase exam. No hydronephrosis. Foley catheter decompresses the urinary bladder. Stomach/Bowel: Enteric tube within the stomach. There is no definite gastric wall thickening. Heterogeneous appearance of small bowel which appears clumped and has interposed high-density fluid. There is no obvious bowel pneumatosis. No obstruction. Colonic diverticulosis without diverticulitis. Equivocal wall thickening about the splenic flexure of the colon. Lymphatic: Limited assessment for adenopathy on the current exam. Reproductive: Prostate is unremarkable. Other: Moderate volume of complex free fluid suspicious for blood present in both upper quadrants, tracking in the pericolic gutters and in the pelvis. Ill-defined fluid insinuating throughout small bowel suspected, not well-defined. There is moderate amount of free air, greatest under the hemidiaphragms, as well as scattered throughout the mesentery. Fluid tracks into the left inguinal canal. Suspected small bowel loop extending into the left inguinal canal, better appreciated on prior exam. Musculoskeletal: Degenerative change in the spine. There are no acute or suspicious osseous abnormalities. Review of the MIP images confirms the above findings. IMPRESSION: 1. Post recent aorto bifem bypass graft, the graft is patent. Air and stranding adjacent to the graft is likely related to recent surgery. No active arterial extravasation. 2. Free intra-abdominal air as well as moderate volume hemoperitoneum within both upper quadrants, tracking into the pelvis and mesentery. Mesenteric blood is difficult to differentiate from adjacent small bowel loops. Findings are suspicious for  bowel perforation, though the site of perforation is not localized on the current exam. Bowel ischemia suspected in this clinical scenario, but otherwise no bowel pneumatosis or obvious site of ischemic bowel. 3. Left internal jugular venous catheter  courses into the hemi azygous system in the upper chest, terminates just above the thoracic aorta. 4. Additional nonacute findings as described above. Aortic Atherosclerosis (ICD10-I70.0) and Emphysema (ICD10-J43.9). These results were called by telephone at the time of interpretation on 11/30/2020 at 5:12 pm to provider GRACE BOWSER , who verbally acknowledged these results. Electronically Signed   By: Keith Rake M.D.   On: 11/30/2020 17:14   ECHOCARDIOGRAM LIMITED  Result Date: 11/30/2020    ECHOCARDIOGRAM LIMITED REPORT   Patient Name:   Brian Yates Date of Exam: 11/30/2020 Medical Rec #:  [id number]        Height:       68.0 in Accession #:    [id number]       Weight:       138.2 lb Date of Birth:  11/02/1940        BSA:          1.747 m Patient Age:    36 years         BP:           60/31 mmHg Patient Gender: M                HR:           81 bpm. Exam Location:  Inpatient Procedure: Limited Echo STAT ECHO Indications:    Cardiac Arrest  History:        Patient has prior history of Echocardiogram examinations, most                 recent 11/28/2020. Risk Factors:Current Smoker, Hypertension and                 Dyslipidemia. Aortic Stenosis, Aortobifemoral bypass grafting                 with reimplantation of IMA, bilateral common femoral and                 profundo-femoral endarterectomy 11/29/2020.  Sonographer:    Leavy Cella Referring Phys: [place]  1. Left ventricular ejection fraction, by estimation, is >75%. The left ventricle has hyperdynamic function. There is moderate concentric left ventricular hypertrophy.  2. Right ventricular systolic function is normal. The right ventricular size is small and underfilled.  Tricuspid regurgitation signal is inadequate for assessing PA pressure.  3. The inferior vena cava is normal in size with <50% respiratory variability, suggesting right atrial pressure of 8 mmHg.  4. Small to moderate focal pericardial effusion is present. The pericardial effusion is anterior to the right ventricle and appears to have some fibrinous material in the pericardium as well. The RV and LV are underfilled. Images in the parasternal long  axix are worrisome for diastolic RV collapse. There is variation in the MV inflow velocities with respirations and the IVC does not collapse but is normal in size. Findings are worrisome for percaridal tamponade.In the setting of new pericardial effusion (no effusion by echo a few days ago) and chest pain with cardiac arrest after aortic surgery, recommend TEE (patient in renal failure) to rule out aortic dissection. FINDINGS  Left Ventricle: Left ventricular ejection fraction, by estimation, is >75%. The left ventricle has hyperdynamic function. The left ventricular internal cavity size was small. There is moderate concentric left ventricular hypertrophy. Right Ventricle: The right ventricular size is normal. Right ventricular systolic function is normal. Tricuspid regurgitation signal is inadequate for assessing PA pressure. Pericardium: A moderately sized pericardial effusion is present.  The pericardial effusion is anterior to the right ventricle. There is diastolic collapse of the right ventricular free wall and excessive respiratory variation in the mitral valve spectral Doppler velocities. There is evidence of cardiac tamponade. Venous: The inferior vena cava is normal in size with less than 50% respiratory variability, suggesting right atrial pressure of 8 mmHg. LEFT VENTRICLE PLAX 2D LVIDd:         3.10 cm LVIDs:         1.70 cm LV PW:         1.50 cm LV IVS:        1.70 cm  LEFT ATRIUM         Index LA diam:    2.60 cm 1.49 cm/m   AORTA Ao Root diam: 3.20 cm  MITRAL VALVE MV Area (PHT): 5.38 cm MV Decel Time: 141 msec MV E velocity: 114.00 cm/s MV A velocity: 71.10 cm/s MV E/A ratio:  1.60 Fransico Him MD Electronically signed by Fransico Him MD Signature Date/Time: 11/30/2020/2:46:19 PM    Final      Assessment and Plan:   1. PEA arrest: Unclear if there is a respiratory component.  -PEA arrest occurred on postop day #1, postop course was complicated by AKI, significant hypotension and significant elevation of white blood cell count up to 40s.  -Patient was given large amount of IV fluid prior to arrest.  Family member just arrived when the patient clenched his fist and he said he was having severe chest pain.  He subsequently had PEA arrest.  Telemetry reveals his heart rate quickly bradycardia down until he went into sinus arrest.  He had 8 minutes of resuscitation prior to ROSC. EKG continue to show sinus rhythm with bifascicular block, unchanged when compare to previous EKG except faster HR.   -curbed sided EP, unclear reason behind PEA arrest. Although patient had chest pain prior to PEA arrest, Hs trop only mildly elevated likely due to resuscitation effort. Hs trop 206 --> 302 --> 955  -unlikely to benefit from catheterization or pacemaker implantation at this point. Will continue to observe.   2. Possible hemoperitoneum with worsening anemia: concerning feature as hemoglobin is trending down despite multiple blood transfusion. Although no schistocytes on smear. CTA of abdomen concerning for free air and hemoperitoneum. Will defer to primary team.   3. Shock liver: Elevated transaminase due to hypotension  4. AKI: Cr 1.24 --> 2.12 --> 2.83 --> 3.08 --> 2.98 --> 2.37. Seen by nephrology, ATN, underwent CRRT.   5. Lactic acidosis: lactic acid >11 after arrest   6. Hypertension: hypotensive since arrest, still on IV pressors  7. Hyperlipidemia  8. History of lung cancer  9. Severe PAD: underwent aortobifemoral bypass surgery.   10. History  of bifascicular block with sinus pauses seen on heart monitor 2021   Risk Assessment/Risk Scores:     TIMI Risk Score for Unstable Angina or Non-ST Elevation MI:   The patient's TIMI risk score is 3, which indicates a 13% risk of all cause mortality, new or recurrent myocardial infarction or need for urgent revascularization in the next 14 days.   For questions or updates, please contact [place] Please consult [email or web address] for contact info under   Signed, Almyra Deforest, [place]  12/03/2020 12:35 PM As above, patient seen and examined.  Briefly he is a 80 year old male with past medical history of coronary calcification, hypertension, hyperlipidemia, mild aortic stenosis, bifascicular block, peripheral vascular disease and status post peripheral vascular surgery for  evaluation of PEA arrest.  Most recent echocardiogram Nov 28, 2020 showed normal LV function, moderate left ventricular hypertrophy, grade 1 diastolic dysfunction, mild aortic stenosis with mean gradient 12 mmHg.  Nuclear study Nov 21, 2020 showed ejection fraction 55%, diaphragmatic attenuation but no ischemia.  Patient underwent aortobifemoral bypass with reimplantation of the inferior mesenteric artery, bilateral common femoral and profundofemoral endarterectomy and repair of small umbilical hernia on June 1.  On June 2 patient was doing well then based on report described chest pain followed by respiratory distress.  He then developed bradycardia with long pauses followed by PEA arrest.  He had CPR for 8 minutes by report and achieved spontaneous circulation.  He is now intubated and cardiology asked to evaluate.  Patient did have an echocardiogram immediately following events which showed hyperdynamic LV function, moderate left ventricular hypertrophy, small to moderate anterior pericardial effusion with question tamponade features.  Emergent transesophageal echocardiogram showed moderate pericardial effusion anterior to the right ventricle  with no right ventricular right atrial collapse, ejection fraction 70 to 75%, severe left ventricular hypertrophy, trace aortic insufficiency and grade 3 plaque in the descending aorta.  CTA showed no pulmonary embolus and no dissection.  There was air in the peritoneal cavity and question findings suggestive of bowel perforation/ischemia.  Patient has developed acute renal failure requiring C RRT.  BNP 372, troponin 206, 302, 955.  Initial lactic acid greater than 11.  Hemoglobin decreased from 12.4 to 5.1 requiring transfusion.  Electrocardiogram shows sinus tachycardia, left anterior fascicular block, right bundle branch block.  I have reviewed all of the patient's electrocardiograms from this admission and no diagnostic ST changes are noted.  1 PEA arrest-sequence of events is unclear.  Based on nurses report who was present at the time, patient described chest discomfort followed by respiratory distress.  At that time patient was noted to have sinus tachycardia on telemetry.  Patient was subsequently  bagged and then developed sinus bradycardia/pauses followed by PEA arrest.  Based on this description it sounds as though the initiating event was respiratory.  Doubt this was ischemia mediated as electrocardiogram showed no ST changes and troponin only minimally elevated following 8 minutes of CPR.  Would also expect if this was ischemia mediated arrhythmia would be ventricular fibrillation and not PEA.  Echocardiogram also shows no wall motion abnormalities. Note follow-up CT also showed no dissection and no pulmonary embolus.  Pericardial effusion also seems less likely as follow-up transesophageal echocardiogram showed no right atrial or right ventricular collapse.  Patient does have a history of bifascicular block/conduction abnormalities but bradycardia seems Yates likely secondary to respiratory events.  For now would continue to follow on telemetry.  We will repeat echocardiogram tomorrow to reassess  pericardial effusion.   2 elevated troponin-likely secondary to CPR.  Recent nuclear study showed no ischemia.  3 status post peripheral vascular surgery-patient with acute blood loss anemia requiring transfusion.  Also with elevated lactic acid.  Plan is for exploratory laparotomy later today.  4 acute kidney injury-followed by nephrology.  On CRRT.  Kirk Ruths, MD

## 2020-12-01 NOTE — Anesthesia Postprocedure Evaluation (Signed)
Anesthesia Post Note  Patient: Brian Yates  Procedure(s) Performed: EXPLORATORY LAPAROTOMY (N/A ) SUBTOTAL COLECTOMY WITH ENDOILIOSTOMY     Patient location during evaluation: SICU Anesthesia Type: General Level of consciousness: sedated Pain management: pain level controlled Vital Signs Assessment: post-procedure vital signs reviewed and stable Respiratory status: patient remains intubated per anesthesia plan Cardiovascular status: stable Postop Assessment: no apparent nausea or vomiting Anesthetic complications: no   No complications documented.  Last Vitals:  Vitals:   12/18/2020 1417 12/08/2020 1712  BP:    Pulse: (!) 111   Resp: (!) 35 (!) 35  Temp:    SpO2: 100%     Last Pain:  Vitals:   12/08/2020 1400  TempSrc: Rectal  PainSc:                  Effie Berkshire

## 2020-12-01 NOTE — Progress Notes (Addendum)
Hypotensive this morning, off epinephrine. 1 unit pRBCs ordered but not given yet.  LA 9.8 overnight BMP notable for bicarb 17, K+ 4.3 AST 1373 , ALT 640 and rising  VBG 7.22/50/  / 21 obstructed waveforms, mild auto-PEEP (EEP 7)  Plan: Recheck DIC panel, trop, LA, EKG Possibly planning for repeat ex-lap per VS.  Transfuse RBCs  Restart epinephrine; favor this over vasopressin given possible mesenteric vs cardiac ischemia. Vent changes-- RR increased, shortened i-time. Adding duonebs.  Full progress note to follow.  Brian Hy, DO 11/30/2020 8:04 AM [facility]

## 2020-12-01 NOTE — Plan of Care (Signed)

## 2020-12-01 NOTE — Op Note (Signed)
    NAME: Brian Yates    MRN: [id number] DOB: 1940-12-07    DATE OF OPERATION: 12/21/2020  PREOP DIAGNOSIS:    Rule out ischemic bowel  POSTOP DIAGNOSIS:    Ischemic:  PROCEDURE:    Exploratory laparotomy  SURGEON: Judeth Cornfield. Scot Dock, MD  ASSIST: Arlee Muslim, PA  ANESTHESIA: General  EBL: Per anesthesia records  INDICATIONS:    [name] is a 80 y.o. male who underwent aortobifemoral bypass 2 days ago.  He sustained a cardiac arrest yesterday.  He remained critically ill and exploratory laparotomy was recommended in order to rule out bowel ischemia.  FINDINGS:   Moderate intra-abdominal hematoma.  Ischemic sigmoid colon.  Marginally perfuse left colon.  Ischemic cecum.  TECHNIQUE:   Patient was taken to the operating room and received general anesthetic.  The abdomen and groins were prepped and draped in usual sterile fashion.  Previous incision was entered.  There was a moderate amount of old blood was evacuated.  There was hematoma anterior to the retroperitoneal closure which was evacuated.  The abdomen was irrigated with copious amounts of saline.  Transverse colon was reflected superiorly.  The transverse colon appeared well perfused.  I ran the small bowel and there was no evidence of small bowel ischemia.  Right cecum had areas that looked partially necrotic.  The sigmoid colon appeared clearly ischemic.  The left colon appeared marginally well perfused.  Dr. Armandina Gemma and was consulted intraoperatively.  The remainder of the dictation is as per Dr. Harlow Asa.  His plan was for subtotal colectomy and ileostomy.  Given the complexity of the case a first assistant was necessary in order to expedient the procedure and safely perform the technical aspects of the operation.  [name], MD, FACS [facility] DICTATION:   12/13/2020

## 2020-12-01 NOTE — Progress Notes (Signed)
eLink Physician-Brief Progress Note Patient Name: Brian Yates DOB: 1941-02-02 MRN: [id number]   Date of Service  12/06/2020  HPI/Events of Note  Hemoglobin 6.7 gm / dl.  eICU Interventions  One unit PRBC ordered transfused.        Kerry Kass Jodeen Mclin 12/12/2020, 6:59 AM

## 2020-12-01 NOTE — Progress Notes (Signed)
The patient was taken to the operating room for abdominal exploration.  Upon entering the abdomen, there was diffuse hematoma with no obvious active bleeding.  Hematoma was evacuated.  The right colon, sigmoid colon and descending colon did not appear to be viable.  We felt the best option was subtotal colectomy and end ileostomy.  Dr. Harlow Asa performed this procedure.  With me as an Environmental consultant.  Afterwards I came out and talk to the wife and sister and explained the situation.  All of their questions were answered.  Hopefully with subtotal colectomy and removal of the ischemic section will help his clinical condition stabilized.  All other questions were answered.  Brian Yates

## 2020-12-01 NOTE — Interval H&P Note (Signed)
History and Physical Interval Note:  12/03/2020 1:42 PM  Brian Yates  has presented today for surgery, with the diagnosis of Abdominal stenosis.  The various methods of treatment have been discussed with the patient and family. After consideration of risks, benefits and other options for treatment, the patient has consented to  Procedure(s): EXPLORATORY LAPAROTOMY (N/A) as a surgical intervention.  The patient's history has been reviewed, patient examined, no change in status, stable for surgery.  I have reviewed the patient's chart and labs.  Questions were answered to the patient's satisfaction.     [name]

## 2020-12-01 NOTE — Anesthesia Procedure Notes (Signed)
Arterial Line Insertion Start/End06/25/2022 2:19 PM, 12/07/2020 2:30 PM Performed by: Roberts Gaudy, MD, anesthesiologist  Preanesthetic checklist: patient identified, IV checked and site marked Right, brachial was placed  Attempts: 1 Procedure performed using ultrasound guided technique. Ultrasound Notes:anatomy identified and needle tip was noted to be adjacent to the nerve/plexus identified Following insertion, Biopatch and dressing applied.

## 2020-12-01 NOTE — Progress Notes (Signed)
Patient ID: Brian Yates, male   DOB: 15-Oct-1940, 80 y.o.   MRN: [id number] [facility] Progress Note   Assessment/ Plan:   1. Acute kidney Injury: Anuric, suspected to be cardiac arrest and consequent ischemic ATN.  Started on CRRT for multiple metabolic abnormalities with ultrafiltration limited at this time by hypotension.  He appears to have had some problems with his dialysis catheter limiting blood flow rate. 2.  Anion gap metabolic acidosis: Improving slowly on CRRT, continue to maintain current prescription. 3.  Cardiac arrest/shock: Remains on pressors, on CRRT for management of metabolic acidosis. 4.  Status post aortobifemoral bypass grafting with reimplantation of IMA along with bilateral common femoral and profundofemoral endarterectomy.  Management per vascular surgery. 5.  Anemia: Acute hemoglobin/hematocrit drop noted overnight without schistocytosis.  No overt loss noted however, stool yesterday may have had suspicious order.  Unclear if having hemoperitoneum and getting PRBC transfusion at this time.  Subjective:   CRRT overnight limited by blood flow rates sustained by catheter.  Limited ultrafiltration because of hypotension.   Objective:   BP 92/80   Pulse (!) 106   Temp 97.6 F (36.4 C) (Axillary)   Resp (!) 30   Ht '5\' 8"'  (1.727 m)   Wt 69.6 kg   SpO2 100%   BMI 23.33 kg/m   Intake/Output Summary (Last 24 hours) at 12/15/2020 0825 Last data filed at 12/16/2020 0800 Gross per 24 hour  Intake 5060.2 ml  Output 626 ml  Net 4434.2 ml   Weight change: 4.736 kg  Physical Exam: Gen: Intubated, sedated, appears comfortable CVS: Regular tachycardia, S1 and S2 with ejection systolic murmur Resp: Anteriorly clear to auscultation, no rales/rhonchi Abd: Midline surgical incision appears well opposed/clean, no audible bowel sounds Ext: Trace lower extremity edema  Imaging: DG Chest Port 1 View  Result Date: 11/30/2020 CLINICAL DATA:  Status post  central line placement EXAM: PORTABLE CHEST 1 VIEW COMPARISON:  Film from earlier in the same day. FINDINGS: Endotracheal tube, gastric catheter and left jugular central line are again identified and stable. New right jugular dialysis catheter is seen in the proximal superior vena cava. No pneumothorax is noted. The lungs are well aerated without focal infiltrate. Cardiac shadow is stable. IMPRESSION: No pneumothorax following right central line placement. Catheter tip is noted in the proximal superior vena cava. Electronically Signed   By: Inez Catalina M.D.   On: 11/30/2020 17:24   Portable Chest x-ray  Result Date: 11/30/2020 CLINICAL DATA:  Endotracheal tube placement EXAM: PORTABLE CHEST 1 VIEW COMPARISON:  11/30/2020 FINDINGS: Endotracheal tube in good position.  NG tube enters the stomach. Left neck central venous catheter tip projects to the left of the aortic arch. 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 Arterial placement not excluded. No pneumothorax. Lungs remain clear without infiltrate or effusion. Mild scarring on the left. IMPRESSION: Endotracheal tube in good position Left neck central venous catheter tip projects to the left of the aortic arch which may be in a duplicated SVC. Arterial position not excluded. Electronically Signed   By: Franchot Gallo M.D.   On: 11/30/2020 14:15   DG Chest Port 1 View  Result Date: 11/30/2020 CLINICAL DATA:  80 year old male status post aortic bypass surgery. EXAM: PORTABLE CHEST 1 VIEW COMPARISON:  Portable chest 12/08/2020 and earlier. FINDINGS: Portable AP supine view at 0430 hours. Enteric tube looped in the stomach. Stable left IJ vascular line, tip again projecting over the aortic arch. Allowing for portable technique the  lungs are clear. No pneumothorax. IMPRESSION: 1. Stable left IJ vascular line, tip again projecting over the aortic arch. Recommend transducing the waveform to exclude Common Carotid Artery placement. 2. No superimposed acute  cardiopulmonary abnormality. Enteric tube loops in the stomach. Electronically Signed   By: Genevie Ann M.D.   On: 11/30/2020 06:37   DG Chest Port 1 View  Result Date: 12/18/2020 CLINICAL DATA:  Postop evaluation after aortic bypass EXAM: PORTABLE CHEST 1 VIEW COMPARISON:  02/23/2020 FINDINGS: Cardiac shadow is stable. Gastric catheter is noted coiled within the stomach. Lungs are well aerated bilaterally. Patchy density is again seen in the left mid lung stable from the prior study. Previously seen pneumothorax has resolved. No bony abnormality is noted. Left jugular central line is noted. Tip projects over the aortic knob but likely within the left innominate vein. IMPRESSION: No acute abnormality noted. Electronically Signed   By: Inez Catalina M.D.   On: 11/30/2020 18:09   DG Abd Portable 1V  Result Date: 12/14/2020 CLINICAL DATA:  Status post aortic bypass surgery EXAM: PORTABLE ABDOMEN - 1 VIEW COMPARISON:  None. FINDINGS: Scattered large and small bowel gas is noted. Diffuse aortic calcifications are noted extending into the iliac vessels. Gastric catheter is noted coiled within the stomach. Degenerative changes of lumbar spine are noted. IMPRESSION: No acute abnormality noted. Electronically Signed   By: Inez Catalina M.D.   On: 12/18/2020 18:10   ECHO TEE  Result Date: 11/30/2020    TRANSESOPHOGEAL ECHO REPORT   Patient Name:   Brian Yates Date of Exam: 11/30/2020 Medical Rec #:  [id number]        Height:       68.0 in Accession #:    [id number]       Weight:       138.2 lb Date of Birth:  1941/01/24        BSA:          1.747 m Patient Age:    41 years         BP:           132/79 mmHg Patient Gender: M                HR:           135 bpm. Exam Location:  Inpatient Procedure: Transesophageal Echo, Cardiac Doppler and Color Doppler Indications:     Thoracic aortic aneurysm, I71.1  History:         Patient has prior history of Echocardiogram examinations, most                  recent 11/30/2020. Aortic  Valve Disease; Risk Factors:Current                  Smoker, Dyslipidemia and Hypertension. Aortobifemoral bypass                  grafting with reimplantation of IMA.  Sonographer:     Darlina Sicilian RDCS Referring Phys:  [id number] [facility] A Gasper Sells Diagnosing Phys: Rudean Haskell MD PROCEDURE: After discussion of the risks and benefits of a TEE, an informed consent was obtained from a family member. The patient was intubated. TEE procedure time was 38 minutes. The transesophogeal probe was passed without difficulty through the esophogus of the patient. Imaged were obtained with the patient in a supine position. Sedation performed by different physician. Patients was under conscious sedation during this procedure. Anesthetic administered: 50mg of Fentanyl. Image quality was  technically difficult. The patient's vital signs; including heart rate, blood pressure, and oxygen saturation; remained stable throughout the procedure. The patient developed no complications during the procedure. IMPRESSIONS  1. Moderate pericardial effusion. The pericardial effusion is anterior to the right ventricle. There is significant change in respirophasic valvular inflow velocities; coronary sinus diameter 8 mm, no RV or RA collapse suggestive of increase pericardial  pressure.  2. 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 In the clinic syndrome of pressor administration and with increase in all valvular gradients, less suggestive of anatomic obstruction and favored to be related to a high flow state.  3. Left ventricular ejection fraction, by estimation, is 70 to 75%. The left ventricle has hyperdynamic function. The left ventricle has no regional wall motion abnormalities. There is severe concentric left ventricular hypertrophy.  4. Right ventricular systolic function is hyperdynamic. The right ventricular size is normal.  5. No left  atrial/left atrial appendage thrombus was detected.  6. The mitral valve is abnormal. No evidence of mitral valve regurgitation.  7. The aortic valve is tricuspid. There is mild calcification of the aortic valve. There is moderate thickening of the aortic valve. Aortic valve regurgitation is trivial.  8. Aortic dilatation noted. There is Moderate (Grade III) plaque involving the descending aorta.  9. Emergent study done without evidence of interatrial communication- aggitated saline contrast deferred. FINDINGS  Left Ventricle: Left ventricular ejection fraction, by estimation, is 70 to 75%. The left ventricle has hyperdynamic function. The left ventricle has no regional wall motion abnormalities. The left ventricular internal cavity size was small. There is severe concentric left ventricular hypertrophy. Right Ventricle: The right ventricular size is normal. Right vetricular wall thickness was not assessed. Right ventricular systolic function is hyperdynamic. Left Atrium: Left atrial size was normal in size. No left atrial/left atrial appendage thrombus was detected. Right Atrium: Right atrial size was normal in size. Pericardium: A moderately sized pericardial effusion is present. The pericardial effusion is anterior to the right ventricle. There is no evidence of cardiac tamponade. Mitral Valve: The mitral valve is abnormal. There is mild thickening of the mitral valve leaflet(s). Mild mitral annular calcification. No evidence of mitral valve regurgitation. Tricuspid Valve: The tricuspid valve is grossly normal. Tricuspid valve regurgitation is not demonstrated. Aortic Valve: The aortic valve is tricuspid. There is mild calcification of the aortic valve. There is moderate thickening of the aortic valve. There is mild to moderate aortic valve annular calcification. Aortic valve regurgitation is trivial. Pulmonic Valve: The pulmonic valve was grossly normal. Pulmonic valve regurgitation is not visualized. 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 Aorta: Aortic dilatation noted and the aortic root and ascending aorta are structurally normal, with no evidence of dilitation. There is moderate (Grade III) plaque involving the descending aorta. IAS/Shunts: There is left bowing of the interatrial septum, suggestive of elevated right atrial pressure. The atrial septum is grossly normal.   AORTA Ao Root diam: 3.80 cm Ao Asc diam:  2.80 cm MITRAL VALVE MV Area (PHT): 5.97 cm MV Decel Time: 127 msec MV E velocity: 194.00 cm/s Rudean Haskell MD Electronically signed by Rudean Haskell MD Signature Date/Time: 11/30/2020/6:59:58 PM    Final    CT Angio Chest/Abd/Pel for Dissection W and/or W/WO  Result Date: 11/30/2020 CLINICAL DATA:  Abdominal pain. Chest pain. Hypoactive bowel, cardiac arrest. Postop day 1 from aorto bifem bypass. EXAM: CT ANGIOGRAPHY CHEST, ABDOMEN AND PELVIS TECHNIQUE: Non-contrast CT of  the chest was initially obtained. Multidetector CT imaging through the chest, abdomen and pelvis was performed using the standard protocol during bolus administration of intravenous contrast. Multiplanar reconstructed images and MIPs were obtained and reviewed to evaluate the vascular anatomy. CONTRAST:  87m OMNIPAQUE IOHEXOL 350 MG/ML SOLN COMPARISON:  Radiographs earlier today. Abdominopelvic CTA 11/16/2020, chest CT 11/03/2020 FINDINGS: CTA CHEST FINDINGS Cardiovascular: Advanced aortic atherosclerosis. This includes irregular plaque involving the distal thoracic aorta which is peripherally calcified, not significantly changed from prior exam. No aortic hematoma. No aortic aneurysm, dissection, or evidence of acute aortic syndrome. Conventional branching pattern from the aortic arch with patent branch vessels. No central pulmonary embolus to the segmental level. Heart is normal in size. Small pericardial effusion measures up to 17 mm anteriorly. Left internal jugular venous catheter courses into the hemi azygous system in  terminates just above the thoracic aorta. There is no adjacent perivascular stranding. Mediastinum/Nodes: Enteric tube within the esophagus which is patulous and air-filled. Endotracheal tube tip is above the carina. Minimal mucus within the distal trachea and right mainstem bronchus. No mediastinal or hilar adenopathy. No suspicious thyroid nodule. Lungs/Pleura: Apical predominant emphysema, at least moderate in degree. Spiculated nodule in the lingula measures 15 x 12 mm, series 8, image 72, not significantly changed. Adjacent ground-glass opacity in the lingula is stable. Smaller adjacent ground-glass nodule on prior CT is not significantly changed, including 7 mm on series 8, image 68. Two adjacent subpleural pulmonary nodules in the right lower lobe, largest measuring 7 mm series 8, image 57 again seen. There is a new subpleural nodule in the right lower lobe measuring 5 mm, series 8, image 77. There are small bilateral pleural effusions with adjacent compressive atelectasis. No pneumothorax. No findings of pulmonary edema. Minimal retained mucus in the distal trachea and right mainstem bronchus. Musculoskeletal: Cortical buckling of the sternal body is new from prior and likely related to CPR. No rib fractures. Lucency in the anterior left glenoid may represent a subchondral cyst, but is nonspecific, series 8, image 5 Review of the MIP images confirms the above findings. CTA ABDOMEN AND PELVIS FINDINGS VASCULAR Aorta: Advanced aortic atherosclerosis including calcified noncalcified plaque in the upper abdominal aorta projecting into the aortic lumen. No upper abdominal stenosis. Aorto bifem graft with adjacent air and stranding, likely related to surgery yesterday. There is no evidence of active extravasation or aortic leak. Aortic graft is patent. Ill-defined air in fluid adjacent to the upper aspect of the graft native distal aorta is densely calcified. Celiac: Plaque at the origin causes mild to moderate  stenosis with poststenotic dilatation. Celiac dilatation of 10 mm just proximal to the hepatic splenic bifurcation. The hepatic artery is diminutive and not well opacified. SMA: Patent with heavily calcified plaque proximally. Distal branches are diminutive. No evidence of thrombus or embolic disease. No severe stenosis proximally. Renals: Patent with moderate plaque. Distal branches are diminutive. IMA: Patent arising from the bypass graft. Inflow: Bi femoral bypass graft is patent without stenosis or dissection. Air and stranding adjacent to the graft is likely related to recent surgery. Veins: Unopacified and not well assessed. There is no portal venous or mesenteric gas. Review of the MIP images confirms the above findings. NON-VASCULAR Hepatobiliary: No obvious focal abnormality on arterial phase imaging. Distended gallbladder without inflammation or calcified stone. Pancreas: No ductal dilatation or inflammation. Spleen: Calcified granuloma. Normal in size. Adrenals/Urinary Tract: No adrenal nodule. Slightly hyperdense adrenal glands. Heterogeneous with suspected bilateral cysts, not well assessed on this  arterial phase exam. No hydronephrosis. Foley catheter decompresses the urinary bladder. Stomach/Bowel: Enteric tube within the stomach. There is no definite gastric wall thickening. Heterogeneous appearance of small bowel which appears clumped and has interposed high-density fluid. There is no obvious bowel pneumatosis. No obstruction. Colonic diverticulosis without diverticulitis. Equivocal wall thickening about the splenic flexure of the colon. Lymphatic: Limited assessment for adenopathy on the current exam. Reproductive: Prostate is unremarkable. Other: Moderate volume of complex free fluid suspicious for blood present in both upper quadrants, tracking in the pericolic gutters and in the pelvis. Ill-defined fluid insinuating throughout small bowel suspected, not well-defined. There is moderate amount of  free air, greatest under the hemidiaphragms, as well as scattered throughout the mesentery. Fluid tracks into the left inguinal canal. Suspected small bowel loop extending into the left inguinal canal, better appreciated on prior exam. Musculoskeletal: Degenerative change in the spine. There are no acute or suspicious osseous abnormalities. Review of the MIP images confirms the above findings. IMPRESSION: 1. Post recent aorto bifem bypass graft, the graft is patent. Air and stranding adjacent to the graft is likely related to recent surgery. No active arterial extravasation. 2. Free intra-abdominal air as well as moderate volume hemoperitoneum within both upper quadrants, tracking into the pelvis and mesentery. Mesenteric blood is difficult to differentiate from adjacent small bowel loops. Findings are suspicious for bowel perforation, though the site of perforation is not localized on the current exam. Bowel ischemia suspected in this clinical scenario, but otherwise no bowel pneumatosis or obvious site of ischemic bowel. 3. Left internal jugular venous catheter courses into the hemi azygous system in the upper chest, terminates just above the thoracic aorta. 4. Additional nonacute findings as described above. Aortic Atherosclerosis (ICD10-I70.0) and Emphysema (ICD10-J43.9). These results were called by telephone at the time of interpretation on 11/30/2020 at 5:12 pm to provider GRACE BOWSER , who verbally acknowledged these results. Electronically Signed   By: Keith Rake M.D.   On: 11/30/2020 17:14   ECHOCARDIOGRAM LIMITED  Result Date: 11/30/2020    ECHOCARDIOGRAM LIMITED REPORT   Patient Name:   Brian Yates Date of Exam: 11/30/2020 Medical Rec #:  [id number]        Height:       68.0 in Accession #:    [id number]       Weight:       138.2 lb Date of Birth:  December 12, 1940        BSA:          1.747 m Patient Age:    7 years         BP:           60/31 mmHg Patient Gender: M                HR:           81  bpm. Exam Location:  Inpatient Procedure: Limited Echo STAT ECHO Indications:    Cardiac Arrest  History:        Patient has prior history of Echocardiogram examinations, most                 recent 11/28/2020. Risk Factors:Current Smoker, Hypertension and                 Dyslipidemia. Aortic Stenosis, Aortobifemoral bypass grafting                 with reimplantation of IMA, bilateral common femoral and  profundo-femoral endarterectomy 006/28/2022.  Sonographer:    Leavy Cella Referring Phys: [place]  1. Left ventricular ejection fraction, by estimation, is >75%. The left ventricle has hyperdynamic function. There is moderate concentric left ventricular hypertrophy.  2. Right ventricular systolic function is normal. The right ventricular size is small and underfilled. Tricuspid regurgitation signal is inadequate for assessing PA pressure.  3. The inferior vena cava is normal in size with <50% respiratory variability, suggesting right atrial pressure of 8 mmHg.  4. Small to moderate focal pericardial effusion is present. The pericardial effusion is anterior to the right ventricle and appears to have some fibrinous material in the pericardium as well. The RV and LV are underfilled. Images in the parasternal long  axix are worrisome for diastolic RV collapse. There is variation in the MV inflow velocities with respirations and the IVC does not collapse but is normal in size. Findings are worrisome for percaridal tamponade.In the setting of new pericardial effusion (no effusion by echo a few days ago) and chest pain with cardiac arrest after aortic surgery, recommend TEE (patient in renal failure) to rule out aortic dissection. FINDINGS  Left Ventricle: Left ventricular ejection fraction, by estimation, is >75%. The left ventricle has hyperdynamic function. The left ventricular internal cavity size was small. There is moderate concentric left ventricular hypertrophy. Right  Ventricle: The right ventricular size is normal. Right ventricular systolic function is normal. Tricuspid regurgitation signal is inadequate for assessing PA pressure. Pericardium: A moderately sized pericardial effusion is present. The pericardial effusion is anterior to the right ventricle. There is diastolic collapse of the right ventricular free wall and excessive respiratory variation in the mitral valve spectral Doppler velocities. There is evidence of cardiac tamponade. Venous: The inferior vena cava is normal in size with less than 50% respiratory variability, suggesting right atrial pressure of 8 mmHg. LEFT VENTRICLE PLAX 2D LVIDd:         3.10 cm LVIDs:         1.70 cm LV PW:         1.50 cm LV IVS:        1.70 cm  LEFT ATRIUM         Index LA diam:    2.60 cm 1.49 cm/m   AORTA Ao Root diam: 3.20 cm MITRAL VALVE MV Area (PHT): 5.38 cm MV Decel Time: 141 msec MV E velocity: 114.00 cm/s MV A velocity: 71.10 cm/s MV E/A ratio:  1.60 Fransico Him MD Electronically signed by Fransico Him MD Signature Date/Time: 11/30/2020/2:46:19 PM    Final     Labs: BMET Recent Labs  Lab 11/28/20 1100 12/12/2020 1246 12/20/2020 1853 11/30/20 0404 11/30/20 1227 11/30/20 1337 11/30/20 1844 11/30/20 2157 12/22/2020 0513  NA 131*   < > 132* 134* 139 139 137 135 134*  K 3.8   < > 4.3 4.4 6.2* 4.2 4.5 4.2 4.3  CL 98  --  104 108 107  --  104 102 102  CO2 26  --  20* 18* 14*  --  16* 19* 17*  GLUCOSE 92  --  173* 165* 139*  --  128* 135* 107*  BUN 11  --  15 21 25*  --  30* 28* 20  CREATININE 1.05  --  1.24 2.12* 2.83*  --  3.08* 2.85* 2.37*  CALCIUM 9.7  --  8.1* 7.6* 8.0*  --  8.8* 8.3* 7.3*  PHOS  --   --   --   --   --   --   --   --  4.9*   < > = values in this interval not displayed.   CBC Recent Labs  Lab 11/30/20 1227 11/30/20 1337 11/30/20 1505 11/30/20 1844 11/30/20 2157 12/02/2020 0513  WBC 55.3*  --   --  45.9* 44.3* 43.5*  HGB 7.3* 6.1*  --  6.7* 8.7* 6.7*  HCT 24.7* 18.0*  --  20.1* 25.2*  19.6*  MCV 100.0  --   --  91.0 89.0 89.5  PLT 194  --  155 146* 113*  111* 101*    Medications:    . sodium chloride   Intravenous Once  . sodium chloride   Intravenous Once  . chlorhexidine gluconate (MEDLINE KIT)  15 mL Mouth Rinse BID  . Chlorhexidine Gluconate Cloth  6 each Topical Daily  . docusate  100 mg Per Tube BID  . heparin  5,000 Units Subcutaneous Q8H  . insulin aspart  1-3 Units Subcutaneous Q4H  . ipratropium-albuterol  3 mL Nebulization Q6H  . mouth rinse  15 mL Mouth Rinse 10 times per day  . pantoprazole (PROTONIX) IV  40 mg Intravenous QHS  . polyethylene glycol  17 g Per Tube Daily  . sodium bicarbonate  50 mEq Intravenous Once  . sodium chloride flush  10-40 mL Intracatheter Q12H   Elmarie Shiley, MD 12/24/2020, 8:25 AM

## 2020-12-01 NOTE — Progress Notes (Signed)
PT Cancellation Note  Patient Details Name: Brian Yates MRN: [id number] DOB: 08-Feb-1941   Cancelled Treatment:    Reason Eval/Treat Not Completed: Patient not medically ready (Pt with PEA arrest and CRRT post evaluation and not currently appropriate)   [name] [name] 12/27/2020, 7:20 AM  [place] [facility] Pager: [phone] Office: [phone]

## 2020-12-01 NOTE — Progress Notes (Signed)
Shift Summary: transfused multiple blood products. Taken to OR for ex-lap, total colectomy with placement of illeostomy. New arterial line placed by anesthesiology at bedside.   N: Currently withdrawing to pain, though with SAT this AM patient was following commands, opening eyes to pain, and nodding appropriately. Cold at 29F, prismaflex warmer and bair hugger on. PERRLA 4/brisk.   R: ETT in place, vent settings changed per order by MD and RT. ABG at end of shift WDL. Clear/dim lungs, no secretions.   CV: ST 90-115, bigeminy PAC this afternoon, now resolved. BP requiring levophed and epi gtt, able to wean pressors down after OR. This AM, unable to doppler distal pulses, now able to palpate 1+ DP & PT. Warm/dry throughout. RUE slightly weeping.   GI: NGT to LIS, scant brown output. No BM. BG 70s, no correction given. 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   GU: foley in place, no output. Requested to remove foley with CCM team, was told to hold off on removal and reassess tomorrow. CRRT goal to run even, unable to pull fluids or increase blood flow so ending shift +4L with blood products.   Skin: Skin intact, no new skin issues.  Family at bedside throughout shift, updated by vascular surgeon and bedside RN. Updated additional family members via phone. Blood products given in CVICU completed charting. 2 FFP, 1 platelet, 4 prbc given/finished by OR staff.   Anshi Jalloh RN

## 2020-12-01 NOTE — Op Note (Addendum)
Operative Note  Pre-operative Diagnosis:  Colonic ischemia / infarction  Post-operative Diagnosis:  same  Surgeon:  Armandina Gemma, MD  Assistant:  Annamarie Major, MD   Procedure:  Total colectomy  Anesthesia:  general  Estimated Blood Loss:  50 cc  Drains: none         Specimen: colon to pathology  Indications: Patient is a 80 year old male who had undergone aortobifemoral bypass with reimplantation of the inferior mesenteric artery on November 29, 2020.  Patient was returned to the operating room today by the vascular surgery team.  He underwent exploratory laparotomy and was found to have evidence of ischemia and infarction of the sigmoid colon and right colon.  General surgery was consulted intraoperatively and I came to the operating room to assist Dr. Gae Gallop and Dr. Annamarie Major.  Procedure:  The patient was seen in the pre-op holding area. The risks, benefits, complications, treatment options, and expected outcomes were previously discussed with the patient. The patient agreed with the proposed plan and has signed the informed consent form.  The patient was brought to the operating room by the surgical team, identified as Janalyn Harder and the procedure verified. A "time out" was completed and the above information confirmed.  Patient was under general anesthesia.  Laparotomy had been performed.  Abdomen abdomen explored.  The sigmoid colon signs of infarction with venous thrombosis.  The rectum appeared normal.  The left colon shows signs of ischemia.  Transverse colon appeared normal.  Right colon however showed signs of infarction in the cecum and ischemia throughout the right colon.  After discussion along the 3 surgeons, we decided to proceed with total colectomy and end ileostomy.  Peritoneal attachments around the base of the cecum were mobilized with the electrocautery.  The appendiceal mesentery was divided with the LigaSure.  A point in the distal ileum was selected.   Mesentery was incised and the bowel was transected with a GIA stapler.  Mesentery was divided with the LigaSure.  Lateral peritoneal attachments on the right colon and mobilized up to the hepatic flexure.  Omentum was mobilized off of the transverse colon.  The right colonic mesentery was divided with the LigaSure taking care to avoid the underlying duodenum.  Dissection was carried onto the transverse colon mesentery.  A middle colic artery was identified and divided between [place] clamps and ligated with 2-0 silk ties.  Dissection was carried through to the distal transverse colon again using the LigaSure to divide the mesentery.  The splenic flexure was taken down upon mobilizing the lateral peritoneal attachments.  Dissection was continued into the descending colon again dividing the mesentery with LigaSure.  Sigmoid colon adhesions to the lateral abdominal wall and lateral pelvis were taken down with the electrocautery.  Dissection was carried down to the mesentery taking care to avoid the underlying structures using the LigaSure.  The sigmoid mesentery was divided with the LigaSure.  Dissection was carried down to the distal sigmoid colon.  The proximal rectum was identified.  Peritoneum was incised bilaterally and the remaining mesentery was divided with the LigaSure down to the wall of the proximal rectum.  The rectum was then transected with a TA 60 stapler and the Kocher clamp proximally.  The colon was divided with a #10 blade.  The entire colon was removed from the peritoneal cavity and passed off the field as specimen.  The abdomen was inspected for hemostasis.  Hemostasis was obtained in the distal mesentery with interrupted 2-0 silk  sutures.  Abdomen was then irrigated copiously with warm saline which was evacuated.  The distal ileum was inspected.  There was a dense adhesions where the ileum was somewhat folded upon itself.  This involved the last 10 cm of the bowel.  A decision was made to resect  the distal 10 cm of the ileum.  Therefore the bowel was divided with a GIA stapler and the mesentery was divided with the LigaSure.  An elliptical incision was made on the skin of the right mid abdominal wall.  Underlying adipose tissue was excised.  A cruciate incision was made in the anterior fascia.  The muscle was split and the peritoneal cavity was entered cautiously.  The distal ileum was brought through the abdominal wall. Bowel was secured to the fascia circumferentially with interrupted 3-0 silk sutures.  Following closure of the midline abdominal incision by Dr. Trula Slade, the ileostomy was matured in the fashion of [place] with interrupted 3-0 Vicryl sutures.  An ostomy appliance was applied.  Remained throughout the operative procedure was dictated under a report by Dr. Trula Slade.  Armandina Gemma, [facility] Office: [phone]

## 2020-12-01 NOTE — Transfer of Care (Signed)
Immediate Anesthesia Transfer of Care Note  Patient: Brian Yates  Procedure(s) Performed: EXPLORATORY LAPAROTOMY (N/A ) SUBTOTAL COLECTOMY WITH ENDOILIOSTOMY  Patient Location: ICU  Anesthesia Type:General  Level of Consciousness: sedated and Patient remains intubated per anesthesia plan  Airway & Oxygen Therapy: Patient remains intubated per anesthesia plan and Patient placed on Ventilator (see vital sign flow sheet for setting)  Post-op Assessment: Report given to RN and Post -op Vital signs reviewed and stable  Post vital signs: Reviewed and stable  Last Vitals:  Vitals Value Taken Time  BP 121/40 12/15/2020 1712  Temp    Pulse 86 11/30/2020 1714  Resp 35 12/27/2020 1714  SpO2 89 % 12/22/2020 1714  Vitals shown include unvalidated device data.  Last Pain:  Vitals:   11/30/2020 1400  TempSrc: Rectal  PainSc:       Patients Stated Pain Goal: 2 (01/20/56 5051)  Complications: No complications documented.

## 2020-12-01 NOTE — Progress Notes (Signed)
NAME:  Brian Yates, MRN:  [id number], DOB:  Dec 14, 1940, LOS: 2 ADMISSION DATE:  12/06/2020, CONSULTATION DATE:  11/30/20 REFERRING MD:  Trula Slade - [facility] , CHIEF COMPLAINT:  Cardiac arrest   History of Present Illness:  Brian Yates is 80 yo M extensive PMH admitted to [facility] 6/1 for planned aorto bifemoral bypass in setting of aortoiliac and fem-pop occlusive disease. POD1 aortobifem bypass, reimplantation of IMA and bilateral femoral endarterectomies.The patient rapidly decompensated. On 6/2 CCM was called emergently to bedside by nursing staff and patient sustained a PEA/Asytole cardiac arrest. ROSC achieved <5 minutes, pt got 1 amp epi 1 amp bicarb and atropine, was TCP before stabilizing his HR minutes later.   At time of consultation, pt with new AKI, new leukocytosis, worsening hypoxia, encephalopathy and hemodynamic instability.   [name] remains critically ill in the [facility] ICU  Pertinent  Medical History  EtOH abuse Gout Diverticulosis ED Hep B HLD HTN IBS Parkinsons  Occlusive vascular disease  Aortic valve stenosis RBBB CAD  Significant Hospital Events: Including procedures, antibiotic start and stop dates in addition to other pertinent events   . 6/1 planned aorto bifem bypass with reimplantation of IMA and bilateral common fem and profundofemoral endarterectomy. Small umbilical hernia repair. Post op significant leukocytosis WBC 38 . 6/2 poor doppler signals distal BLE, palpable fem. Cardiac arrest unclear cause. CCM consult during code. Intubated emergently TEE EF 70-75% hyperdynamic, ?moderate pericardial effusion. CT Chest w/ contrast- No aortic dissection, no tamponade, no PE, mesenteric vessels appear patient per vascular. Free air present>pt post laparotomy. Small amount of blood in abdomen. HD cath> Vanc> Cefe> . 6/3 2 PRBC, 1 cryo, 1 FFP, 500ML IVF, 25g albumin. Cardiology consulted  Interim History / Subjective:   Overnight, issues with flow through HD  cath, per nursing- unable to flow blood faster than 148ml/hr. Fluid bolus and albumin given. 2 PRBC, 1 cryo, 1 FFP given overnight  Tmax 98.0, 72ml uop, +4.3L past 24, +7.5L admit  Upon exam- Bicarb GTT at 125, Propofol 25, Levo at 20.  Unable to obtain subjective evaluation due to patient status  Objective   Blood pressure 92/80, pulse (!) 106, temperature 97.6 F (36.4 C), temperature source Axillary, resp. rate (!) 30, height 5\' 8"  (1.727 m), weight 69.6 kg, SpO2 100 %. CVP:  [2 mmHg-37 mmHg] 28 mmHg  Vent Mode: PRVC FiO2 (%):  [21 %-100 %] 40 % Set Rate:  [24 bmp-30 bmp] 30 bmp Vt Set:  [540 mL] 540 mL PEEP:  [5 cmH20] 5 cmH20 Plateau Pressure:  [13 cmH20-17 cmH20] 17 cmH20   Intake/Output Summary (Last 24 hours) at 12/12/2020 0840 Last data filed at 12/05/2020 0800 Gross per 24 hour  Intake 5060.2 ml  Output 664 ml  Net 4396.2 ml   Filed Weights   12/20/2020 0939 11/30/20 0638 12/25/2020 0300  Weight: 64.9 kg 62.7 kg 69.6 kg    Examination: General: Ill appearing, in bed, comfortable HEENT: MM pink, anicteric, trachea midline, ETT  Neuro: Sedated, RASS -4, PERRL 36mm CV: S1S2, ST, no m/r/g appreciated PULM:  Air movement appreciated in all lobes, scant secretions, chest expansion symmetric GI: soft, bsx4 hypoactive, nondistended    Extremities: dry, feet cool, hands warm, palpable femoral and popliteal pulses, no doppler in DP Skin: Laparotomy site, femoral surgical sites, no other lesions appreciated.   Labs/imaging that I havepersonally reviewed  (right click and "Reselect all SmartList Selections" daily)  Lactate CBC CMP DIC panel CK VBG  Resolved Hospital  Problem list     Assessment & Plan:   Acute encephalopathy  Suspect secondary to hypoperfusion post arrest? Metabolic? Actively bleeding with HGB drop. Hx parkinsons  -Continue PAD bundle with propofol GTT and fentanyl pushes. Wean sedation to goal rass 0 to -1. -Continue close neurological  monitoring  Acute respiratory failure with hypoxia requiring intubation Adenocarcinoma of the lung s/p radiation x3  Tobacco abuse Suspect resp failure secondary to arrest. VBG 7.22/50/31/23. P -LTVV strategy with tidal volumes of 4-8 cc/kg ideal body weight -Goal plateau pressures of 30 and driving pressures of 15 -Wean PEEP/FiO2 for SpO2 greater than 92 -Follow intermittent CXR and ABG PRN -PAD bundle as discussed -Continue VAP bundle -Will not attempt to wean ventilator in setting of HD instability   Cardiac arrest Distributive shock Pericardial effusion Lactic Acidosis Elevated CK ROSC <41min, unclear etiology but reported c/o chest pain prior. ?respiratory arrest per nursing. Moving extremities after ROSC Troponin (high sens) 302>955 post CPR. CT - for PE. Lactate 9.8>9.5. CK 2000's>1869 Not a candidate for TTM. P -Cardiology consulted. Appreciate assistance -Continue Levophed. Goal SBP 110-140. Adding Epinephrine -Possible hypovolemic component. See below.  -Obtain EKG -Trend troponin -Continue Empiric Vanc and cefepime. -Give 1g calcium gluconate -Continue to trend lactate  -Trend COOX.  Acute blood loss anemia Thombocytopenia Fibrinogenemia Coagulopathy Post surgery. DIC panel negative for shistocytes, INR elevated to 2.7. Fibrinogen 2.7, PLT 101.  2 PRBC, 1 cryo, 1 FFP given overnight.  HGB 6.7 on am cbc. ?bleeding in abdomen on CT. -Transfuse PRBC if HBG less than 7. Give 1 U PRBC this am. Follow up CBC in late AM.  -Goal fibrinogen greater than 200 -Give 2U FFP elevated INR. Repeat INR late AM. -Give 1g calcium gluconate  -Ensure normothermia  S/p aortobifemoral bypass grafting with reimplantation of IMA S/p bilateral common femoral and profundofemoral endarterectomy P -Management per vascular surgery  Transaminitis-worsening Suspect shock liver -trend LFTs -Follow up coags -Goal map greater than 65  Leukocytosis  WBC 44>43. Unclear if post surgery,  ischemic, or infectious -Continue ABX as discussed  -Appreciate VVS assistance with consideration of bowel. VVS taking back to OR today.  AKI AGMA Elevated lactate. AG 17 -Continue CRRT. -Apreciate nephrology assistance -Continuing bicarb gtt at 125.   Hypomagnesemia-resolved  Hyponatremia, mild  Hypocalcemia MG 2.2, NA 134 -Continue to monitor in the setting of AKI -No free water. Monitor NA on bmp -Calcium repleted  Hx HTN Hx RBBB Hx nonrheumatic aortic valve stenosis  - hold antihypertensives in the setting of shock. -Continue Tele and ICU monitoring.   Patient with worsening multisystem organ failure. Prognosis guarded.   Best practice (right click and "Reselect all SmartList Selections" daily)  Diet:  NPO Pain/Anxiety/Delirium protocol (if indicated): Yes (RASS goal 0) VAP protocol (if indicated): Yes DVT prophylaxis: Subcutaneous Heparin GI prophylaxis: PPI Glucose control:  SSI No Central venous access:  Yes, and it is still needed Arterial line:  Yes, and it is still needed Foley:  Yes, and it is still needed Mobility:  bed rest  PT consulted: N/A Last date of multidisciplinary goals of care discussion [per primary] Code Status:  full code Disposition: ICU       CRITICAL CARE Performed by: Estill Cotta   Total critical care time: 39 minutes  Critical care time was exclusive of separately billable procedures and treating other patients.  Critical care was necessary to treat or prevent imminent or life-threatening deterioration.  Critical care was time spent personally by me on the  following activities: development of treatment plan with patient and/or surrogate as well as nursing, discussions with consultants, evaluation of patient's response to treatment, examination of patient, obtaining history from patient or surrogate, ordering and performing treatments and interventions, ordering and review of laboratory studies, ordering and review of  radiographic studies, pulse oximetry and re-evaluation of patient's condition.  Redmond School., MSN, APRN, AGACNP-BC [facility]  12/19/2020 , 8:41 AM  Please see [email or web address] for pager details  If no response, please call [phone] After hours, please call Elink at [phone]

## 2020-12-01 NOTE — Plan of Care (Signed)
  Problem: Education: Goal: Knowledge of General Education information will improve Description: Including pain rating scale, medication(s)/side effects and non-pharmacologic comfort measures Outcome: Progressing   Problem: Clinical Measurements: Goal: Ability to maintain clinical measurements within normal limits will improve Outcome: Progressing Goal: Will remain free from infection Outcome: Progressing Goal: Diagnostic test results will improve Outcome: Progressing Goal: Respiratory complications will improve Outcome: Progressing Goal: Cardiovascular complication will be avoided Outcome: Progressing   Problem: Activity: Goal: Risk for activity intolerance will decrease Outcome: Progressing   Problem: Nutrition: Goal: Adequate nutrition will be maintained Outcome: Progressing   Problem: Coping: Goal: Level of anxiety will decrease Outcome: Progressing   Problem: Elimination: Goal: Will not experience complications related to bowel motility Outcome: Progressing Goal: Will not experience complications related to urinary retention Outcome: Progressing   Problem: Pain Managment: Goal: General experience of comfort will improve Outcome: Progressing   Problem: Safety: Goal: Ability to remain free from injury will improve Outcome: Progressing

## 2020-12-02 ENCOUNTER — Inpatient Hospital Stay (HOSPITAL_COMMUNITY): Payer: Medicare Other

## 2020-12-02 DIAGNOSIS — J9601 Acute respiratory failure with hypoxia: Secondary | ICD-10-CM | POA: Diagnosis not present

## 2020-12-02 DIAGNOSIS — I313 Pericardial effusion (noninflammatory): Secondary | ICD-10-CM

## 2020-12-02 DIAGNOSIS — I452 Bifascicular block: Secondary | ICD-10-CM

## 2020-12-02 DIAGNOSIS — R778 Other specified abnormalities of plasma proteins: Secondary | ICD-10-CM

## 2020-12-02 DIAGNOSIS — Q251 Coarctation of aorta: Secondary | ICD-10-CM

## 2020-12-02 DIAGNOSIS — Z9911 Dependence on respirator [ventilator] status: Secondary | ICD-10-CM | POA: Diagnosis not present

## 2020-12-02 LAB — TYPE AND SCREEN
ABO/RH(D): O POS
Antibody Screen: NEGATIVE
Unit division: 0
Unit division: 0
Unit division: 0

## 2020-12-02 LAB — COMPREHENSIVE METABOLIC PANEL
ALT: 794 U/L — ABNORMAL HIGH (ref 0–44)
ALT: 584 U/L — ABNORMAL HIGH (ref 0–44)
AST: 2518 U/L — ABNORMAL HIGH (ref 15–41)
AST: 2103 U/L — ABNORMAL HIGH (ref 15–41)
Albumin: 2.3 g/dL — ABNORMAL LOW (ref 3.5–5.0)
Albumin: 2.6 g/dL — ABNORMAL LOW (ref 3.5–5.0)
Alkaline Phosphatase: 82 U/L (ref 38–126)
Alkaline Phosphatase: 80 U/L (ref 38–126)
Anion gap: 12 (ref 5–15)
Anion gap: 9 (ref 5–15)
BUN: 14 mg/dL (ref 8–23)
BUN: 12 mg/dL (ref 8–23)
CO2: 23 mmol/L (ref 22–32)
CO2: 28 mmol/L (ref 22–32)
Calcium: 7.5 mg/dL — ABNORMAL LOW (ref 8.9–10.3)
Calcium: 6.8 mg/dL — ABNORMAL LOW (ref 8.9–10.3)
Chloride: 101 mmol/L (ref 98–111)
Chloride: 98 mmol/L (ref 98–111)
Creatinine, Ser: 1.6 mg/dL — ABNORMAL HIGH (ref 0.61–1.24)
Creatinine, Ser: 1.41 mg/dL — ABNORMAL HIGH (ref 0.61–1.24)
GFR, Estimated: 44 mL/min — ABNORMAL LOW (ref >60)
GFR, Estimated: 51 mL/min — ABNORMAL LOW (ref >60)
Glucose, Bld: 83 mg/dL (ref 70–99)
Glucose, Bld: 64 mg/dL — ABNORMAL LOW (ref 70–99)
Potassium: 3.3 mmol/L — ABNORMAL LOW (ref 3.5–5.1)
Potassium: 4.9 mmol/L (ref 3.5–5.1)
Sodium: 136 mmol/L (ref 135–145)
Sodium: 135 mmol/L (ref 135–145)
Total Bilirubin: 1.8 mg/dL — ABNORMAL HIGH (ref 0.3–1.2)
Total Bilirubin: 2.4 mg/dL — ABNORMAL HIGH (ref 0.3–1.2)
Total Protein: 4.3 g/dL — ABNORMAL LOW (ref 6.5–8.1)
Total Protein: 4.3 g/dL — ABNORMAL LOW (ref 6.5–8.1)

## 2020-12-02 LAB — BPAM FFP
Blood Product Expiration Date: 202206062359
Blood Product Expiration Date: 202206062359
Blood Product Expiration Date: 202206032359
Blood Product Expiration Date: 202206082359
Blood Product Expiration Date: 202206062359
Blood Product Expiration Date: 202206062359
Blood Product Expiration Date: 202206062359
ISSUE DATE / TIME: 202206021735
ISSUE DATE / TIME: 202206030835
ISSUE DATE / TIME: 202206031046
ISSUE DATE / TIME: 202206031355
ISSUE DATE / TIME: 202206031355
ISSUE DATE / TIME: 202206032030
ISSUE DATE / TIME: 202206032030
Unit Type and Rh: 5100
Unit Type and Rh: 5100
Unit Type and Rh: 6200
Unit Type and Rh: 5100
Unit Type and Rh: 5100
Unit Type and Rh: 5100
Unit Type and Rh: 5100

## 2020-12-02 LAB — PROTIME-INR
INR: 1.5 — ABNORMAL HIGH (ref 0.8–1.2)
INR: 1.5 — ABNORMAL HIGH (ref 0.8–1.2)
Prothrombin Time: 18.1 seconds — ABNORMAL HIGH (ref 11.4–15.2)
Prothrombin Time: 18.4 seconds — ABNORMAL HIGH (ref 11.4–15.2)

## 2020-12-02 LAB — CBC
HCT: 24.3 % — ABNORMAL LOW (ref 39.0–52.0)
HCT: 33.1 % — ABNORMAL LOW (ref 39.0–52.0)
HCT: 34 % — ABNORMAL LOW (ref 39.0–52.0)
HCT: 34.7 % — ABNORMAL LOW (ref 39.0–52.0)
Hemoglobin: 11.4 g/dL — ABNORMAL LOW (ref 13.0–17.0)
Hemoglobin: 11.7 g/dL — ABNORMAL LOW (ref 13.0–17.0)
Hemoglobin: 12.3 g/dL — ABNORMAL LOW (ref 13.0–17.0)
Hemoglobin: 7.8 g/dL — ABNORMAL LOW (ref 13.0–17.0)
MCH: 29.2 pg (ref 26.0–34.0)
MCH: 29.9 pg (ref 26.0–34.0)
MCH: 30.5 pg (ref 26.0–34.0)
MCH: 30.6 pg (ref 26.0–34.0)
MCHC: 32.1 g/dL (ref 30.0–36.0)
MCHC: 33.5 g/dL (ref 30.0–36.0)
MCHC: 35.3 g/dL (ref 30.0–36.0)
MCHC: 35.4 g/dL (ref 30.0–36.0)
MCV: 86.2 fL (ref 80.0–100.0)
MCV: 86.3 fL (ref 80.0–100.0)
MCV: 89.2 fL (ref 80.0–100.0)
MCV: 91 fL (ref 80.0–100.0)
Platelets: 47 10*3/uL — ABNORMAL LOW (ref 150–400)
Platelets: 59 10*3/uL — ABNORMAL LOW (ref 150–400)
Platelets: 65 10*3/uL — ABNORMAL LOW (ref 150–400)
Platelets: 76 10*3/uL — ABNORMAL LOW (ref 150–400)
RBC: 2.67 MIL/uL — ABNORMAL LOW (ref 4.22–5.81)
RBC: 3.81 MIL/uL — ABNORMAL LOW (ref 4.22–5.81)
RBC: 3.84 MIL/uL — ABNORMAL LOW (ref 4.22–5.81)
RBC: 4.02 MIL/uL — ABNORMAL LOW (ref 4.22–5.81)
RDW: 15.8 % — ABNORMAL HIGH (ref 11.5–15.5)
RDW: 15.9 % — ABNORMAL HIGH (ref 11.5–15.5)
RDW: 15.9 % — ABNORMAL HIGH (ref 11.5–15.5)
RDW: 16.6 % — ABNORMAL HIGH (ref 11.5–15.5)
WBC: 17.1 10*3/uL — ABNORMAL HIGH (ref 4.0–10.5)
WBC: 19.9 10*3/uL — ABNORMAL HIGH (ref 4.0–10.5)
WBC: 25.5 10*3/uL — ABNORMAL HIGH (ref 4.0–10.5)
WBC: 39.8 10*3/uL — ABNORMAL HIGH (ref 4.0–10.5)
nRBC: 0.3 % — ABNORMAL HIGH (ref 0.0–0.2)
nRBC: 0.3 % — ABNORMAL HIGH (ref 0.0–0.2)
nRBC: 0.6 % — ABNORMAL HIGH (ref 0.0–0.2)
nRBC: 1 % — ABNORMAL HIGH (ref 0.0–0.2)

## 2020-12-02 LAB — COOXEMETRY PANEL
Carboxyhemoglobin: 0.6 % (ref 0.5–1.5)
Methemoglobin: 1 % (ref 0.0–1.5)
O2 Saturation: 69.5 %
Total hemoglobin: 11.3 g/dL — ABNORMAL LOW (ref 12.0–16.0)

## 2020-12-02 LAB — BPAM PLATELET PHERESIS
Blood Product Expiration Date: 202206032359
ISSUE DATE / TIME: 202206031355
Unit Type and Rh: 5100

## 2020-12-02 LAB — PREPARE FRESH FROZEN PLASMA
Unit division: 0
Unit division: 0
Unit division: 0
Unit division: 0

## 2020-12-02 LAB — FIBRINOGEN
Fibrinogen: 270 mg/dL (ref 210–475)
Fibrinogen: 329 mg/dL (ref 210–475)

## 2020-12-02 LAB — GLUCOSE, CAPILLARY
Glucose-Capillary: 92 mg/dL (ref 70–99)
Glucose-Capillary: 58 mg/dL — ABNORMAL LOW (ref 70–99)
Glucose-Capillary: 72 mg/dL (ref 70–99)
Glucose-Capillary: 64 mg/dL — ABNORMAL LOW (ref 70–99)
Glucose-Capillary: 67 mg/dL — ABNORMAL LOW (ref 70–99)
Glucose-Capillary: 75 mg/dL (ref 70–99)

## 2020-12-02 LAB — ECHOCARDIOGRAM LIMITED
Height: 68 in
Weight: 2532.64 oz

## 2020-12-02 LAB — BPAM RBC
Blood Product Expiration Date: 202207012359
Blood Product Expiration Date: 202207012359
Blood Product Expiration Date: 202207062359
ISSUE DATE / TIME: 202206021635
ISSUE DATE / TIME: 202206021635
ISSUE DATE / TIME: 202206030728
Unit Type and Rh: 5100
Unit Type and Rh: 5100
Unit Type and Rh: 5100

## 2020-12-02 LAB — CALCIUM, IONIZED: Calcium, Ionized, Serum: 4 mg/dL — ABNORMAL LOW (ref 4.5–5.6)

## 2020-12-02 LAB — MAGNESIUM: Magnesium: 2.3 mg/dL (ref 1.7–2.4)

## 2020-12-02 LAB — PHOSPHORUS
Phosphorus: 4.2 mg/dL (ref 2.5–4.6)
Phosphorus: 5.3 mg/dL — ABNORMAL HIGH (ref 2.5–4.6)

## 2020-12-02 LAB — LACTIC ACID, PLASMA
Lactic Acid, Venous: 4.2 mmol/L (ref 0.5–1.9)
Lactic Acid, Venous: 5.9 mmol/L (ref 0.5–1.9)

## 2020-12-02 LAB — PREPARE PLATELET PHERESIS: Unit division: 0

## 2020-12-02 LAB — HEMOGLOBIN AND HEMATOCRIT, BLOOD
HCT: 32 % — ABNORMAL LOW (ref 39.0–52.0)
Hemoglobin: 11.2 g/dL — ABNORMAL LOW (ref 13.0–17.0)

## 2020-12-02 LAB — CK: Total CK: 15494 U/L — ABNORMAL HIGH (ref 49–397)

## 2020-12-02 MED ORDER — DEXTROSE 10 % IV SOLN: INTRAVENOUS | Status: DC

## 2020-12-02 MED ORDER — PRISMASOL BGK 4/2.5 32-4-2.5 MEQ/L REPLACEMENT SOLN: Status: DC

## 2020-12-02 MED ORDER — PRISMASOL BGK 4/2.5 32-4-2.5 MEQ/L EC SOLN: Status: DC

## 2020-12-02 MED ORDER — LATANOPROST 0.005 % OP SOLN
1.0000 [drp] | Freq: Every day | OPHTHALMIC | Status: DC
  Administered 2020-12-02 – 2020-12-03 (×2): 1 [drp] via OPHTHALMIC
  Filled 2020-12-02: qty 2.5

## 2020-12-02 MED ORDER — BRIMONIDINE TARTRATE 0.2 % OP SOLN
1.0000 [drp] | Freq: Three times a day (TID) | OPHTHALMIC | Status: DC
  Administered 2020-12-02 – 2020-12-03 (×5): 1 [drp] via OPHTHALMIC
  Filled 2020-12-02: qty 5

## 2020-12-02 MED ORDER — ALBUMIN HUMAN 25 % IV SOLN
25.0000 g | Freq: Once | INTRAVENOUS | Status: AC
  Administered 2020-12-02: 25 g via INTRAVENOUS
  Filled 2020-12-02: qty 100

## 2020-12-02 NOTE — Progress Notes (Signed)
1 Day Post-Op   Subjective/Chief Complaint: On vent Awake Low UOP   Objective: Vital signs in last 24 hours: Temp:  [92.66 F (33.7 C)-97.7 F (36.5 C)] 97.16 F (36.2 C) (06/04 0700) Pulse Rate:  [56-115] 98 (06/04 0700) Resp:  [30-35] 35 (06/04 0400) BP: (53-194)/(22-171) 112/71 (06/04 0700) SpO2:  [99 %-100 %] 100 % (06/04 0700) Arterial Line BP: (60-167)/(43-84) 113/52 (06/04 0700) FiO2 (%):  [40 %] 40 % (06/04 0700) Weight:  [71.8 kg] 71.8 kg (06/04 0300)    Intake/Output from previous day: 06/03 0701 - 06/04 0700 In: 8183.7 [I.V.:3501.1; Blood:3744.4; IV Piggyback:938.1] Out: 4670 [Urine:40; Blood:400] Intake/Output this shift: Total I/O In: 15.3 [I.V.:15.3] Out: -   Exam: On vent, awake Abdomen soft, dressing dry, ostomy pink/viable  Lab Results:  Recent Labs    12/28/2020 2347 12/02/20 0527  WBC 17.1* 19.9*  HGB 11.7* 12.3*  HCT 33.1* 34.7*  PLT 47* 65*   BMET Recent Labs    12/22/2020 1900 12/02/20 0527  NA 136 136  K 3.6 3.3*  CL 100 101  CO2 18* 23  GLUCOSE 79 83  BUN 17 14  CREATININE 1.98* 1.60*  CALCIUM 7.8* 7.5*   PT/INR Recent Labs    12/10/2020 2347 12/02/20 0527  LABPROT 18.4* 18.1*  INR 1.5* 1.5*   ABG Recent Labs    12/21/2020 1625 12/09/2020 1826  PHART 7.284* 7.487*  HCO3 21.8 19.0*    Studies/Results: DG Chest Port 1 View  Result Date: 11/30/2020 CLINICAL DATA:  Status post central line placement EXAM: PORTABLE CHEST 1 VIEW COMPARISON:  Film from earlier in the same day. FINDINGS: Endotracheal tube, gastric catheter and left jugular central line are again identified and stable. New right jugular dialysis catheter is seen in the proximal superior vena cava. No pneumothorax is noted. The lungs are well aerated without focal infiltrate. Cardiac shadow is stable. IMPRESSION: No pneumothorax following right central line placement. Catheter tip is noted in the proximal superior vena cava. Electronically Signed   By: Inez Catalina  M.D.   On: 11/30/2020 17:24   Portable Chest x-ray  Result Date: 11/30/2020 CLINICAL DATA:  Endotracheal tube placement EXAM: PORTABLE CHEST 1 VIEW COMPARISON:  11/30/2020 FINDINGS: Endotracheal tube in good position.  NG tube enters the stomach. Left neck central venous catheter tip projects to the left of the aortic arch. 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 Arterial placement not excluded. No pneumothorax. Lungs remain clear without infiltrate or effusion. Mild scarring on the left. IMPRESSION: Endotracheal tube in good position Left neck central venous catheter tip projects to the left of the aortic arch which may be in a duplicated SVC. Arterial position not excluded. Electronically Signed   By: Franchot Gallo M.D.   On: 11/30/2020 14:15   ECHO TEE  Result Date: 11/30/2020    TRANSESOPHOGEAL ECHO REPORT   Patient Name:   Brian Yates Date of Exam: 11/30/2020 Medical Rec #:  [id number]        Height:       68.0 in Accession #:    [id number]       Weight:       138.2 lb Date of Birth:  1941/05/03        BSA:          1.747 m Patient Age:    80 years         BP:           132/79 mmHg Patient Gender: M  HR:           135 bpm. Exam Location:  Inpatient Procedure: Transesophageal Echo, Cardiac Doppler and Color Doppler Indications:     Thoracic aortic aneurysm, I71.1  History:         Patient has prior history of Echocardiogram examinations, most                  recent 11/30/2020. Aortic Valve Disease; Risk Factors:Current                  Smoker, Dyslipidemia and Hypertension. Aortobifemoral bypass                  grafting with reimplantation of IMA.  Sonographer:     Darlina Sicilian RDCS Referring Phys:  [id number] [facility] A Gasper Sells Diagnosing Phys: Rudean Haskell MD PROCEDURE: After discussion of the risks and benefits of a TEE, an informed consent was obtained from a family member. The patient was intubated. TEE procedure time was 38 minutes. The transesophogeal probe was passed  without difficulty through the esophogus of the patient. Imaged were obtained with the patient in a supine position. Sedation performed by different physician. Patients was under conscious sedation during this procedure. Anesthetic administered: 19mcg of Fentanyl. Image quality was technically difficult. The patient's vital signs; including heart rate, blood pressure, and oxygen saturation; remained stable throughout the procedure. The patient developed no complications during the procedure. IMPRESSIONS  1. Moderate pericardial effusion. The pericardial effusion is anterior to the right ventricle. There is significant change in respirophasic valvular inflow velocities; coronary sinus diameter 8 mm, no RV or RA collapse suggestive of increase pericardial  pressure.  2. 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 In the clinic syndrome of pressor administration and with increase in all valvular gradients, less suggestive of anatomic obstruction and favored to be related to a high flow state.  3. Left ventricular ejection fraction, by estimation, is 70 to 75%. The left ventricle has hyperdynamic function. The left ventricle has no regional wall motion abnormalities. There is severe concentric left ventricular hypertrophy.  4. Right ventricular systolic function is hyperdynamic. The right ventricular size is normal.  5. No left atrial/left atrial appendage thrombus was detected.  6. The mitral valve is abnormal. No evidence of mitral valve regurgitation.  7. The aortic valve is tricuspid. There is mild calcification of the aortic valve. There is moderate thickening of the aortic valve. Aortic valve regurgitation is trivial.  8. Aortic dilatation noted. There is Moderate (Grade III) plaque involving the descending aorta.  9. Emergent study done without evidence of interatrial communication- aggitated saline contrast deferred. FINDINGS  Left  Ventricle: Left ventricular ejection fraction, by estimation, is 70 to 75%. The left ventricle has hyperdynamic function. The left ventricle has no regional wall motion abnormalities. The left ventricular internal cavity size was small. There is severe concentric left ventricular hypertrophy. Right Ventricle: The right ventricular size is normal. Right vetricular wall thickness was not assessed. Right ventricular systolic function is hyperdynamic. Left Atrium: Left atrial size was normal in size. No left atrial/left atrial appendage thrombus was detected. Right Atrium: Right atrial size was normal in size. Pericardium: A moderately sized pericardial effusion is present. The pericardial effusion is anterior to the right ventricle. There is no evidence of cardiac tamponade. Mitral Valve: The mitral valve is abnormal. There is mild thickening of the mitral valve leaflet(s). Mild mitral annular calcification. No evidence  of mitral valve regurgitation. Tricuspid Valve: The tricuspid valve is grossly normal. Tricuspid valve regurgitation is not demonstrated. Aortic Valve: The aortic valve is tricuspid. There is mild calcification of the aortic valve. There is moderate thickening of the aortic valve. There is mild to moderate aortic valve annular calcification. Aortic valve regurgitation is trivial. Pulmonic Valve: The pulmonic valve was grossly normal. Pulmonic valve regurgitation is not visualized. 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 Aorta: Aortic dilatation noted and the aortic root and ascending aorta are structurally normal, with no evidence of dilitation. There is moderate (Grade III) plaque involving the descending aorta. IAS/Shunts: There is left bowing of the interatrial septum, suggestive of elevated right atrial pressure. The atrial septum is grossly normal.   AORTA Ao Root diam: 3.80 cm Ao Asc diam:  2.80 cm MITRAL VALVE MV Area (PHT): 5.97 cm MV Decel Time: 127 msec MV E velocity: 194.00 cm/s Rudean Haskell MD Electronically signed by Rudean Haskell MD Signature Date/Time: 11/30/2020/6:59:58 PM    Final    CT Angio Chest/Abd/Pel for Dissection W and/or W/WO  Result Date: 11/30/2020 CLINICAL DATA:  Abdominal pain. Chest pain. Hypoactive bowel, cardiac arrest. Postop day 1 from aorto bifem bypass. EXAM: CT ANGIOGRAPHY CHEST, ABDOMEN AND PELVIS TECHNIQUE: Non-contrast CT of the chest was initially obtained. Multidetector CT imaging through the chest, abdomen and pelvis was performed using the standard protocol during bolus administration of intravenous contrast. Multiplanar reconstructed images and MIPs were obtained and reviewed to evaluate the vascular anatomy. CONTRAST:  66mL OMNIPAQUE IOHEXOL 350 MG/ML SOLN COMPARISON:  Radiographs earlier today. Abdominopelvic CTA 11/16/2020, chest CT 11/03/2020 FINDINGS: CTA CHEST FINDINGS Cardiovascular: Advanced aortic atherosclerosis. This includes irregular plaque involving the distal thoracic aorta which is peripherally calcified, not significantly changed from prior exam. No aortic hematoma. No aortic aneurysm, dissection, or evidence of acute aortic syndrome. Conventional branching pattern from the aortic arch with patent branch vessels. No central pulmonary embolus to the segmental level. Heart is normal in size. Small pericardial effusion measures up to 17 mm anteriorly. Left internal jugular venous catheter courses into the hemi azygous system in terminates just above the thoracic aorta. There is no adjacent perivascular stranding. Mediastinum/Nodes: Enteric tube within the esophagus which is patulous and air-filled. Endotracheal tube tip is above the carina. Minimal mucus within the distal trachea and right mainstem bronchus. No mediastinal or hilar adenopathy. No suspicious thyroid nodule. Lungs/Pleura: Apical predominant emphysema, at least moderate in degree. Spiculated nodule in the lingula measures 15 x 12 mm, series 8, image 72, not  significantly changed. Adjacent ground-glass opacity in the lingula is stable. Smaller adjacent ground-glass nodule on prior CT is not significantly changed, including 7 mm on series 8, image 68. Two adjacent subpleural pulmonary nodules in the right lower lobe, largest measuring 7 mm series 8, image 57 again seen. There is a new subpleural nodule in the right lower lobe measuring 5 mm, series 8, image 77. There are small bilateral pleural effusions with adjacent compressive atelectasis. No pneumothorax. No findings of pulmonary edema. Minimal retained mucus in the distal trachea and right mainstem bronchus. Musculoskeletal: Cortical buckling of the sternal body is new from prior and likely related to CPR. No rib fractures. Lucency in the anterior left glenoid may represent a subchondral cyst, but is nonspecific, series 8, image 5 Review of the MIP images confirms the above findings. CTA ABDOMEN AND PELVIS FINDINGS VASCULAR Aorta: Advanced aortic atherosclerosis including calcified noncalcified plaque in the upper abdominal aorta projecting into the aortic lumen.  No upper abdominal stenosis. Aorto bifem graft with adjacent air and stranding, likely related to surgery yesterday. There is no evidence of active extravasation or aortic leak. Aortic graft is patent. Ill-defined air in fluid adjacent to the upper aspect of the graft native distal aorta is densely calcified. Celiac: Plaque at the origin causes mild to moderate stenosis with poststenotic dilatation. Celiac dilatation of 10 mm just proximal to the hepatic splenic bifurcation. The hepatic artery is diminutive and not well opacified. SMA: Patent with heavily calcified plaque proximally. Distal branches are diminutive. No evidence of thrombus or embolic disease. No severe stenosis proximally. Renals: Patent with moderate plaque. Distal branches are diminutive. IMA: Patent arising from the bypass graft. Inflow: Bi femoral bypass graft is patent without stenosis  or dissection. Air and stranding adjacent to the graft is likely related to recent surgery. Veins: Unopacified and not well assessed. There is no portal venous or mesenteric gas. Review of the MIP images confirms the above findings. NON-VASCULAR Hepatobiliary: No obvious focal abnormality on arterial phase imaging. Distended gallbladder without inflammation or calcified stone. Pancreas: No ductal dilatation or inflammation. Spleen: Calcified granuloma. Normal in size. Adrenals/Urinary Tract: No adrenal nodule. Slightly hyperdense adrenal glands. Heterogeneous with suspected bilateral cysts, not well assessed on this arterial phase exam. No hydronephrosis. Foley catheter decompresses the urinary bladder. Stomach/Bowel: Enteric tube within the stomach. There is no definite gastric wall thickening. Heterogeneous appearance of small bowel which appears clumped and has interposed high-density fluid. There is no obvious bowel pneumatosis. No obstruction. Colonic diverticulosis without diverticulitis. Equivocal wall thickening about the splenic flexure of the colon. Lymphatic: Limited assessment for adenopathy on the current exam. Reproductive: Prostate is unremarkable. Other: Moderate volume of complex free fluid suspicious for blood present in both upper quadrants, tracking in the pericolic gutters and in the pelvis. Ill-defined fluid insinuating throughout small bowel suspected, not well-defined. There is moderate amount of free air, greatest under the hemidiaphragms, as well as scattered throughout the mesentery. Fluid tracks into the left inguinal canal. Suspected small bowel loop extending into the left inguinal canal, better appreciated on prior exam. Musculoskeletal: Degenerative change in the spine. There are no acute or suspicious osseous abnormalities. Review of the MIP images confirms the above findings. IMPRESSION: 1. Post recent aorto bifem bypass graft, the graft is patent. Air and stranding adjacent to the  graft is likely related to recent surgery. No active arterial extravasation. 2. Free intra-abdominal air as well as moderate volume hemoperitoneum within both upper quadrants, tracking into the pelvis and mesentery. Mesenteric blood is difficult to differentiate from adjacent small bowel loops. Findings are suspicious for bowel perforation, though the site of perforation is not localized on the current exam. Bowel ischemia suspected in this clinical scenario, but otherwise no bowel pneumatosis or obvious site of ischemic bowel. 3. Left internal jugular venous catheter courses into the hemi azygous system in the upper chest, terminates just above the thoracic aorta. 4. Additional nonacute findings as described above. Aortic Atherosclerosis (ICD10-I70.0) and Emphysema (ICD10-J43.9). These results were called by telephone at the time of interpretation on 11/30/2020 at 5:12 pm to provider GRACE BOWSER , who verbally acknowledged these results. Electronically Signed   By: Keith Rake M.D.   On: 11/30/2020 17:14   ECHOCARDIOGRAM LIMITED  Result Date: 11/30/2020    ECHOCARDIOGRAM LIMITED REPORT   Patient Name:   Brian Yates Date of Exam: 11/30/2020 Medical Rec #:  [id number]        Height:  68.0 in Accession #:    [id number]       Weight:       138.2 lb Date of Birth:  23-Sep-1940        BSA:          1.747 m Patient Age:    32 years         BP:           60/31 mmHg Patient Gender: M                HR:           81 bpm. Exam Location:  Inpatient Procedure: Limited Echo STAT ECHO Indications:    Cardiac Arrest  History:        Patient has prior history of Echocardiogram examinations, most                 recent 11/28/2020. Risk Factors:Current Smoker, Hypertension and                 Dyslipidemia. Aortic Stenosis, Aortobifemoral bypass grafting                 with reimplantation of IMA, bilateral common femoral and                 profundo-femoral endarterectomy 11/29/2020.  Sonographer:    Leavy Cella  Referring Phys: [place]  1. Left ventricular ejection fraction, by estimation, is >75%. The left ventricle has hyperdynamic function. There is moderate concentric left ventricular hypertrophy.  2. Right ventricular systolic function is normal. The right ventricular size is small and underfilled. Tricuspid regurgitation signal is inadequate for assessing PA pressure.  3. The inferior vena cava is normal in size with <50% respiratory variability, suggesting right atrial pressure of 8 mmHg.  4. Small to moderate focal pericardial effusion is present. The pericardial effusion is anterior to the right ventricle and appears to have some fibrinous material in the pericardium as well. The RV and LV are underfilled. Images in the parasternal long  axix are worrisome for diastolic RV collapse. There is variation in the MV inflow velocities with respirations and the IVC does not collapse but is normal in size. Findings are worrisome for percaridal tamponade.In the setting of new pericardial effusion (no effusion by echo a few days ago) and chest pain with cardiac arrest after aortic surgery, recommend TEE (patient in renal failure) to rule out aortic dissection. FINDINGS  Left Ventricle: Left ventricular ejection fraction, by estimation, is >75%. The left ventricle has hyperdynamic function. The left ventricular internal cavity size was small. There is moderate concentric left ventricular hypertrophy. Right Ventricle: The right ventricular size is normal. Right ventricular systolic function is normal. Tricuspid regurgitation signal is inadequate for assessing PA pressure. Pericardium: A moderately sized pericardial effusion is present. The pericardial effusion is anterior to the right ventricle. There is diastolic collapse of the right ventricular free wall and excessive respiratory variation in the mitral valve spectral Doppler velocities. There is evidence of cardiac tamponade. Venous: The inferior  vena cava is normal in size with less than 50% respiratory variability, suggesting right atrial pressure of 8 mmHg. LEFT VENTRICLE PLAX 2D LVIDd:         3.10 cm LVIDs:         1.70 cm LV PW:         1.50 cm LV IVS:        1.70 cm  LEFT ATRIUM  Index LA diam:    2.60 cm 1.49 cm/m   AORTA Ao Root diam: 3.20 cm MITRAL VALVE MV Area (PHT): 5.38 cm MV Decel Time: 141 msec MV E velocity: 114.00 cm/s MV A velocity: 71.10 cm/s MV E/A ratio:  1.60 Fransico Him MD Electronically signed by Fransico Him MD Signature Date/Time: 11/30/2020/2:46:19 PM    Final     Anti-infectives: Anti-infectives (From admission, onward)   Start     Dose/Rate Route Frequency Ordered Stop   12/02/20 1830  anidulafungin (ERAXIS) 100 mg in sodium chloride 0.9 % 100 mL IVPB        100 mg 78 mL/hr over 100 Minutes Intravenous Every 24 hours 12/26/2020 1711     12/21/2020 2200  vancomycin (VANCOREADY) IVPB 750 mg/150 mL        750 mg 150 mL/hr over 60 Minutes Intravenous Every 24 hours 11/30/20 1729     12/21/2020 1800  anidulafungin (ERAXIS) 200 mg in sodium chloride 0.9 % 200 mL IVPB        200 mg 78 mL/hr over 200 Minutes Intravenous  Once 12/11/2020 1711 12/09/2020 2233   12/22/2020 1600  metroNIDAZOLE (FLAGYL) IVPB 500 mg        500 mg 100 mL/hr over 60 Minutes Intravenous Every 8 hours 12/18/2020 1342     12/23/2020 0500  ceFEPIme (MAXIPIME) 2 g in sodium chloride 0.9 % 100 mL IVPB        2 g 200 mL/hr over 30 Minutes Intravenous Every 12 hours 11/30/20 1729     11/30/20 1815  vancomycin (VANCOREADY) IVPB 1250 mg/250 mL        1,250 mg 166.7 mL/hr over 90 Minutes Intravenous  Once 11/30/20 1729 11/30/20 1944   11/30/20 1430  vancomycin (VANCOREADY) IVPB 1250 mg/250 mL  Status:  Discontinued        1,250 mg 166.7 mL/hr over 90 Minutes Intravenous Every 48 hours 11/30/20 1248 11/30/20 1729   11/30/20 1400  ceFEPIme (MAXIPIME) 2 g in sodium chloride 0.9 % 100 mL IVPB  Status:  Discontinued        2 g 200 mL/hr over 30 Minutes  Intravenous Every 24 hours 11/30/20 1248 11/30/20 1729   12/23/2020 2115  ceFAZolin (ANCEF) IVPB 2g/100 mL premix        2 g 200 mL/hr over 30 Minutes Intravenous Every 8 hours 12/14/2020 2023 11/30/20 0447   12/15/2020 1007  ceFAZolin (ANCEF) IVPB 2g/100 mL premix        2 g 200 mL/hr over 30 Minutes Intravenous 30 min pre-op 12/25/2020 1007 12/23/2020 1142      Assessment/Plan: s/p Procedure(s): EXPLORATORY LAPAROTOMY (N/A) SUBTOTAL COLECTOMY WITH ENDOILIOSTOMY  POD#1  Hgb stable Lactic acid decreasing Ostomy viable Continue NPO and NG Creatinine improved  Coralie Keens MD 12/02/2020

## 2020-12-02 NOTE — Progress Notes (Addendum)
[facility]  Subjective  - awake moving not really following commands   Objective (!) 69/27 (!) 105 98.42 F (36.9 C) (!) 35 96%  Intake/Output Summary (Last 24 hours) at 12/02/2020 1117 Last data filed at 12/02/2020 0900 Gross per 24 hour  Intake 6598.71 ml  Output 4685 ml  Net 1913.71 ml   On EPI and LEVO  Abdomen soft Incisions clean Ostomy pink  Feet cool but appear perfused left first toe with some duskiness  Assessment/Planning: 1. Acute renal failure on CvVH per renal, hopefully regain function next few days  2. 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 Shock weaning some pressor today  4. Aortobifem patent graft  Appreciate CCM, Gen Surg Cardiology Nephrology assistance  Wife updated by phone  Brian Yates 12/02/2020 11:17 AM --  Laboratory Lab Results: Recent Labs    12/03/2020 2347 12/02/20 0527  WBC 17.1* 19.9*  HGB 11.7* 12.3*  HCT 33.1* 34.7*  PLT 47* 65*   BMET Recent Labs    12/26/2020 1900 12/02/20 0527  NA 136 136  K 3.6 3.3*  CL 100 101  CO2 18* 23  GLUCOSE 79 83  BUN 17 14  CREATININE 1.98* 1.60*  CALCIUM 7.8* 7.5*    COAG Lab Results  Component Value Date   INR 1.5 (H) 12/02/2020   INR 1.5 (H) 12/24/2020   INR 2.0 (H) 12/16/2020   No results found for: PTT

## 2020-12-02 NOTE — Progress Notes (Signed)
Progress Note  Patient Name: Brian Yates Date of Encounter: 12/02/2020  [facility] Cardiologist: Shirlee More, MD   Subjective   Intubated, sedated. 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 Bleeding has resolved. On pressors.  Inpatient Medications    Scheduled Meds: . sodium chloride   Intravenous Once  . sodium chloride   Intravenous Once  . chlorhexidine gluconate (MEDLINE KIT)  15 mL Mouth Rinse BID  . Chlorhexidine Gluconate Cloth  6 each Topical Daily  . docusate  100 mg Per Tube BID  . heparin  5,000 Units Subcutaneous Q8H  . insulin aspart  1-3 Units Subcutaneous Q4H  . ipratropium-albuterol  3 mL Nebulization Q6H  . mouth rinse  15 mL Mouth Rinse 10 times per day  . pantoprazole (PROTONIX) IV  40 mg Intravenous QHS  . polyethylene glycol  17 g Per Tube Daily  . sodium chloride flush  10-40 mL Intracatheter Q12H   Continuous Infusions: .  prismasol BGK 4/2.5 400 mL/hr at 12/02/20 0847  .  prismasol BGK 4/2.5 400 mL/hr at 12/02/20 0847  . sodium chloride 10 mL/hr at 11/30/20 1200  . anidulafungin    . ceFEPime (MAXIPIME) IV Stopped (12/02/20 0618)  . epinephrine 8 mcg/min (12/02/20 0932)  . metronidazole Stopped (12/02/20 0824)  . norepinephrine (LEVOPHED) Adult infusion 10 mcg/min (12/02/20 0900)  . prismasol BGK 4/2.5 1,500 mL/hr at 12/02/20 0849  . propofol (DIPRIVAN) infusion Stopped (12/02/20 0716)  .  sodium bicarbonate (isotonic) infusion in sterile water 125 mL/hr at 12/02/20 0900  . vancomycin Stopped (12/19/2020 2235)   PRN Meds: acetaminophen **OR** acetaminophen, alum & mag hydroxide-simeth, bisacodyl, diphenhydrAMINE, fentaNYL (SUBLIMAZE) injection, fentaNYL (SUBLIMAZE) injection, guaiFENesin-dextromethorphan, heparin, ondansetron, phenol, potassium chloride, sodium chloride, sodium chloride flush   Vital Signs    Vitals:   12/02/20 1000 12/02/20 1015 12/02/20 1030 12/02/20 1045  BP: (!) 69/27     Pulse: (!) 110 (!) 109 (!)  107 (!) 105  Resp:      Temp: 98.6 F (37 C) 98.42 F (36.9 C) 98.42 F (36.9 C) 98.42 F (36.9 C)  TempSrc:      SpO2: 97% 95% 96% 96%  Weight:      Height:        Intake/Output Summary (Last 24 hours) at 12/02/2020 1132 Last data filed at 12/02/2020 0900 Gross per 24 hour  Intake 6598.71 ml  Output 4685 ml  Net 1913.71 ml   Last 3 Weights 12/02/2020 12/21/2020 11/30/2020  Weight (lbs) 158 lb 4.6 oz 153 lb 7 oz 138 lb 3.7 oz  Weight (kg) 71.8 kg 69.6 kg 62.7 kg      Telemetry    NSR - Personally Reviewed  ECG    NSR, RBBB+LAFB - Personally Reviewed  Physical Exam  Sedated, mechanically ventilated GEN: No acute distress.   Neck: No JVD Cardiac: RRR, no murmurs, rubs, or gallops.  Respiratory: Clear to auscultation bilaterally. GI: Soft, nontender, mildly distended MS: No edema; No deformity. Cool extremities Neuro:  Nonfocal  Psych: Normal affect   Labs    High Sensitivity Troponin:   Recent Labs  Lab 11/30/20 1227 11/30/20 1506 12/12/2020 0753  TROPONINIHS 206* 302* 955*      Chemistry Recent Labs  Lab 12/24/2020 0513 12/16/2020 0800 12/03/2020 1115 12/03/2020 1456 12/20/2020 1826 12/23/2020 1900 12/02/20 0527  NA 134*   < > 136   < > 137 136 136  K 4.3   < > 3.3*   < > 3.5 3.6  3.3*  CL 102  --  103  --   --  100 101  CO2 17*  --  20*  --   --  18* 23  GLUCOSE 107*  --  92  --   --  79 83  BUN 20  --  13  --   --  17 14  CREATININE 2.37*  --  1.65*  --   --  1.98* 1.60*  CALCIUM 7.3*  --  7.0*  --   --  7.8* 7.5*  PROT 3.5*  --   --   --   --  3.4* 4.3*  ALBUMIN 2.3*  --   --   --   --  2.0* 2.3*  AST 1,373*  --   --   --   --  2,384* 2,518*  ALT 640*  --   --   --   --  860* 794*  ALKPHOS 41  --   --   --   --  70 82  BILITOT 0.9  --   --   --   --  1.2 1.8*  GFRNONAA 27*  --  42*  --   --  34* 44*  ANIONGAP 15  --  13  --   --  18* 12   < > = values in this interval not displayed.     Hematology Recent Labs  Lab 12/27/2020 1900 12/21/2020 2347  12/02/20 0527  WBC 14.3* 17.1* 19.9*  RBC 3.63* 3.84* 4.02*  HGB 10.8* 11.7* 12.3*  HCT 32.7* 33.1* 34.7*  MCV 90.1 86.2 86.3  MCH 29.8 30.5 30.6  MCHC 33.0 35.3 35.4  RDW 15.9* 15.8* 15.9*  PLT 42* 47* 65*    BNP Recent Labs  Lab 11/30/20 1227  BNP 372.5*     DDimer  Recent Labs  Lab 11/30/20 1505 11/30/20 2157 12/25/2020 0753  DDIMER 4.34* 4.28* 3.92*     Radiology    DG Chest Port 1 View  Result Date: 11/30/2020 CLINICAL DATA:  Status post central line placement EXAM: PORTABLE CHEST 1 VIEW COMPARISON:  Film from earlier in the same day. FINDINGS: Endotracheal tube, gastric catheter and left jugular central line are again identified and stable. New right jugular dialysis catheter is seen in the proximal superior vena cava. No pneumothorax is noted. The lungs are well aerated without focal infiltrate. Cardiac shadow is stable. IMPRESSION: No pneumothorax following right central line placement. Catheter tip is noted in the proximal superior vena cava. Electronically Signed   By: Inez Catalina M.D.   On: 11/30/2020 17:24   Portable Chest x-ray  Result Date: 11/30/2020 CLINICAL DATA:  Endotracheal tube placement EXAM: PORTABLE CHEST 1 VIEW COMPARISON:  11/30/2020 FINDINGS: Endotracheal tube in good position.  NG tube enters the stomach. Left neck central venous catheter tip projects to the left of the aortic arch. 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 Arterial placement not excluded. No pneumothorax. Lungs remain clear without infiltrate or effusion. Mild scarring on the left. IMPRESSION: Endotracheal tube in good position Left neck central venous catheter tip projects to the left of the aortic arch which may be in a duplicated SVC. Arterial position not excluded. Electronically Signed   By: Franchot Gallo M.D.   On: 11/30/2020 14:15   ECHO TEE  Result Date: 11/30/2020    TRANSESOPHOGEAL ECHO REPORT   Patient Name:   Brian Yates Date of Exam: 11/30/2020 Medical Rec #:  [id number]  Height:       68.0 in Accession #:    [id number]       Weight:       138.2 lb Date of Birth:  10-10-1940        BSA:          1.747 m Patient Age:    80 years         BP:           132/79 mmHg Patient Gender: M                HR:           135 bpm. Exam Location:  Inpatient Procedure: Transesophageal Echo, Cardiac Doppler and Color Doppler Indications:     Thoracic aortic aneurysm, I71.1  History:         Patient has prior history of Echocardiogram examinations, most                  recent 11/30/2020. Aortic Valve Disease; Risk Factors:Current                  Smoker, Dyslipidemia and Hypertension. Aortobifemoral bypass                  grafting with reimplantation of IMA.  Sonographer:     Darlina Sicilian RDCS Referring Phys:  [id number] [facility] A Gasper Sells Diagnosing Phys: Rudean Haskell MD PROCEDURE: After discussion of the risks and benefits of a TEE, an informed consent was obtained from a family member. The patient was intubated. TEE procedure time was 38 minutes. The transesophogeal probe was passed without difficulty through the esophogus of the patient. Imaged were obtained with the patient in a supine position. Sedation performed by different physician. Patients was under conscious sedation during this procedure. Anesthetic administered: 68mg of Fentanyl. Image quality was technically difficult. The patient's vital signs; including heart rate, blood pressure, and oxygen saturation; remained stable throughout the procedure. The patient developed no complications during the procedure. IMPRESSIONS  1. Moderate pericardial effusion. The pericardial effusion is anterior to the right ventricle. There is significant change in respirophasic valvular inflow velocities; coronary sinus diameter 8 mm, no RV or RA collapse suggestive of increase pericardial  pressure.  2. 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 In the clinic  syndrome of pressor administration and with increase in all valvular gradients, less suggestive of anatomic obstruction and favored to be related to a high flow state.  3. Left ventricular ejection fraction, by estimation, is 70 to 75%. The left ventricle has hyperdynamic function. The left ventricle has no regional wall motion abnormalities. There is severe concentric left ventricular hypertrophy.  4. Right ventricular systolic function is hyperdynamic. The right ventricular size is normal.  5. No left atrial/left atrial appendage thrombus was detected.  6. The mitral valve is abnormal. No evidence of mitral valve regurgitation.  7. The aortic valve is tricuspid. There is mild calcification of the aortic valve. There is moderate thickening of the aortic valve. Aortic valve regurgitation is trivial.  8. Aortic dilatation noted. There is Moderate (Grade III) plaque involving the descending aorta.  9. Emergent study done without evidence of interatrial communication- aggitated saline contrast deferred. FINDINGS  Left Ventricle: Left ventricular ejection fraction, by estimation, is 70 to 75%. The left ventricle has hyperdynamic function. The left ventricle has no regional wall motion abnormalities. The left ventricular internal cavity size was small. There  is severe concentric left ventricular hypertrophy. Right Ventricle: The right ventricular size is normal. Right vetricular wall thickness was not assessed. Right ventricular systolic function is hyperdynamic. Left Atrium: Left atrial size was normal in size. No left atrial/left atrial appendage thrombus was detected. Right Atrium: Right atrial size was normal in size. Pericardium: A moderately sized pericardial effusion is present. The pericardial effusion is anterior to the right ventricle. There is no evidence of cardiac tamponade. Mitral Valve: The mitral valve is abnormal. There is mild thickening of the mitral valve leaflet(s). Mild mitral annular calcification.  No evidence of mitral valve regurgitation. Tricuspid Valve: The tricuspid valve is grossly normal. Tricuspid valve regurgitation is not demonstrated. Aortic Valve: The aortic valve is tricuspid. There is mild calcification of the aortic valve. There is moderate thickening of the aortic valve. There is mild to moderate aortic valve annular calcification. Aortic valve regurgitation is trivial. Pulmonic Valve: The pulmonic valve was grossly normal. Pulmonic valve regurgitation is not visualized. 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 Aorta: Aortic dilatation noted and the aortic root and ascending aorta are structurally normal, with no evidence of dilitation. There is moderate (Grade III) plaque involving the descending aorta. IAS/Shunts: There is left bowing of the interatrial septum, suggestive of elevated right atrial pressure. The atrial septum is grossly normal.   AORTA Ao Root diam: 3.80 cm Ao Asc diam:  2.80 cm MITRAL VALVE MV Area (PHT): 5.97 cm MV Decel Time: 127 msec MV E velocity: 194.00 cm/s Rudean Haskell MD Electronically signed by Rudean Haskell MD Signature Date/Time: 11/30/2020/6:59:58 PM    Final    CT Angio Chest/Abd/Pel for Dissection W and/or W/WO  Result Date: 11/30/2020 CLINICAL DATA:  Abdominal pain. Chest pain. Hypoactive bowel, cardiac arrest. Postop day 1 from aorto bifem bypass. EXAM: CT ANGIOGRAPHY CHEST, ABDOMEN AND PELVIS TECHNIQUE: Non-contrast CT of the chest was initially obtained. Multidetector CT imaging through the chest, abdomen and pelvis was performed using the standard protocol during bolus administration of intravenous contrast. Multiplanar reconstructed images and MIPs were obtained and reviewed to evaluate the vascular anatomy. CONTRAST:  27m OMNIPAQUE IOHEXOL 350 MG/ML SOLN COMPARISON:  Radiographs earlier today. Abdominopelvic CTA 11/16/2020, chest CT 11/03/2020 FINDINGS: CTA CHEST FINDINGS Cardiovascular: Advanced aortic atherosclerosis. This includes irregular  plaque involving the distal thoracic aorta which is peripherally calcified, not significantly changed from prior exam. No aortic hematoma. No aortic aneurysm, dissection, or evidence of acute aortic syndrome. Conventional branching pattern from the aortic arch with patent branch vessels. No central pulmonary embolus to the segmental level. Heart is normal in size. Small pericardial effusion measures up to 17 mm anteriorly. Left internal jugular venous catheter courses into the hemi azygous system in terminates just above the thoracic aorta. There is no adjacent perivascular stranding. Mediastinum/Nodes: Enteric tube within the esophagus which is patulous and air-filled. Endotracheal tube tip is above the carina. Minimal mucus within the distal trachea and right mainstem bronchus. No mediastinal or hilar adenopathy. No suspicious thyroid nodule. Lungs/Pleura: Apical predominant emphysema, at least moderate in degree. Spiculated nodule in the lingula measures 15 x 12 mm, series 8, image 72, not significantly changed. Adjacent ground-glass opacity in the lingula is stable. Smaller adjacent ground-glass nodule on prior CT is not significantly changed, including 7 mm on series 8, image 68. Two adjacent subpleural pulmonary nodules in the right lower lobe, largest measuring 7 mm series 8, image 57 again seen. There is a new subpleural nodule in the right lower lobe measuring 5 mm, series 8, image  77. There are small bilateral pleural effusions with adjacent compressive atelectasis. No pneumothorax. No findings of pulmonary edema. Minimal retained mucus in the distal trachea and right mainstem bronchus. Musculoskeletal: Cortical buckling of the sternal body is new from prior and likely related to CPR. No rib fractures. Lucency in the anterior left glenoid may represent a subchondral cyst, but is nonspecific, series 8, image 5 Review of the MIP images confirms the above findings. CTA ABDOMEN AND PELVIS FINDINGS VASCULAR  Aorta: Advanced aortic atherosclerosis including calcified noncalcified plaque in the upper abdominal aorta projecting into the aortic lumen. No upper abdominal stenosis. Aorto bifem graft with adjacent air and stranding, likely related to surgery yesterday. There is no evidence of active extravasation or aortic leak. Aortic graft is patent. Ill-defined air in fluid adjacent to the upper aspect of the graft native distal aorta is densely calcified. Celiac: Plaque at the origin causes mild to moderate stenosis with poststenotic dilatation. Celiac dilatation of 10 mm just proximal to the hepatic splenic bifurcation. The hepatic artery is diminutive and not well opacified. SMA: Patent with heavily calcified plaque proximally. Distal branches are diminutive. No evidence of thrombus or embolic disease. No severe stenosis proximally. Renals: Patent with moderate plaque. Distal branches are diminutive. IMA: Patent arising from the bypass graft. Inflow: Bi femoral bypass graft is patent without stenosis or dissection. Air and stranding adjacent to the graft is likely related to recent surgery. Veins: Unopacified and not well assessed. There is no portal venous or mesenteric gas. Review of the MIP images confirms the above findings. NON-VASCULAR Hepatobiliary: No obvious focal abnormality on arterial phase imaging. Distended gallbladder without inflammation or calcified stone. Pancreas: No ductal dilatation or inflammation. Spleen: Calcified granuloma. Normal in size. Adrenals/Urinary Tract: No adrenal nodule. Slightly hyperdense adrenal glands. Heterogeneous with suspected bilateral cysts, not well assessed on this arterial phase exam. No hydronephrosis. Foley catheter decompresses the urinary bladder. Stomach/Bowel: Enteric tube within the stomach. There is no definite gastric wall thickening. Heterogeneous appearance of small bowel which appears clumped and has interposed high-density fluid. There is no obvious bowel  pneumatosis. No obstruction. Colonic diverticulosis without diverticulitis. Equivocal wall thickening about the splenic flexure of the colon. Lymphatic: Limited assessment for adenopathy on the current exam. Reproductive: Prostate is unremarkable. Other: Moderate volume of complex free fluid suspicious for blood present in both upper quadrants, tracking in the pericolic gutters and in the pelvis. Ill-defined fluid insinuating throughout small bowel suspected, not well-defined. There is moderate amount of free air, greatest under the hemidiaphragms, as well as scattered throughout the mesentery. Fluid tracks into the left inguinal canal. Suspected small bowel loop extending into the left inguinal canal, better appreciated on prior exam. Musculoskeletal: Degenerative change in the spine. There are no acute or suspicious osseous abnormalities. Review of the MIP images confirms the above findings. IMPRESSION: 1. Post recent aorto bifem bypass graft, the graft is patent. Air and stranding adjacent to the graft is likely related to recent surgery. No active arterial extravasation. 2. Free intra-abdominal air as well as moderate volume hemoperitoneum within both upper quadrants, tracking into the pelvis and mesentery. Mesenteric blood is difficult to differentiate from adjacent small bowel loops. Findings are suspicious for bowel perforation, though the site of perforation is not localized on the current exam. Bowel ischemia suspected in this clinical scenario, but otherwise no bowel pneumatosis or obvious site of ischemic bowel. 3. Left internal jugular venous catheter courses into the hemi azygous system in the upper chest, terminates just above  the thoracic aorta. 4. Additional nonacute findings as described above. Aortic Atherosclerosis (ICD10-I70.0) and Emphysema (ICD10-J43.9). These results were called by telephone at the time of interpretation on 11/30/2020 at 5:12 pm to provider GRACE BOWSER , who verbally  acknowledged these results. Electronically Signed   By: Keith Rake M.D.   On: 11/30/2020 17:14   ECHOCARDIOGRAM LIMITED  Result Date: 11/30/2020    ECHOCARDIOGRAM LIMITED REPORT   Patient Name:   JUSHUA WALTMAN Date of Exam: 11/30/2020 Medical Rec #:  [id number]        Height:       68.0 in Accession #:    [id number]       Weight:       138.2 lb Date of Birth:  [birth year]        BSA:          1.747 m Patient Age:    23 years         BP:           60/31 mmHg Patient Gender: M                HR:           81 bpm. Exam Location:  Inpatient Procedure: Limited Echo STAT ECHO Indications:    Cardiac Arrest  History:        Patient has prior history of Echocardiogram examinations, most                 recent 11/28/2020. Risk Factors:Current Smoker, Hypertension and                 Dyslipidemia. Aortic Stenosis, Aortobifemoral bypass grafting                 with reimplantation of IMA, bilateral common femoral and                 profundo-femoral endarterectomy 11/29/2020.  Sonographer:    Leavy Cella Referring Phys: [place]  1. Left ventricular ejection fraction, by estimation, is >75%. The left ventricle has hyperdynamic function. There is moderate concentric left ventricular hypertrophy.  2. Right ventricular systolic function is normal. The right ventricular size is small and underfilled. Tricuspid regurgitation signal is inadequate for assessing PA pressure.  3. The inferior vena cava is normal in size with <50% respiratory variability, suggesting right atrial pressure of 8 mmHg.  4. Small to moderate focal pericardial effusion is present. The pericardial effusion is anterior to the right ventricle and appears to have some fibrinous material in the pericardium as well. The RV and LV are underfilled. Images in the parasternal long  axix are worrisome for diastolic RV collapse. There is variation in the MV inflow velocities with respirations and the IVC does not collapse but is normal  in size. Findings are worrisome for percaridal tamponade.In the setting of new pericardial effusion (no effusion by echo a few days ago) and chest pain with cardiac arrest after aortic surgery, recommend TEE (patient in renal failure) to rule out aortic dissection. FINDINGS  Left Ventricle: Left ventricular ejection fraction, by estimation, is >75%. The left ventricle has hyperdynamic function. The left ventricular internal cavity size was small. There is moderate concentric left ventricular hypertrophy. Right Ventricle: The right ventricular size is normal. Right ventricular systolic function is normal. Tricuspid regurgitation signal is inadequate for assessing PA pressure. Pericardium: A moderately sized pericardial effusion is present. The pericardial effusion is anterior to the right ventricle. There is diastolic  collapse of the right ventricular free wall and excessive respiratory variation in the mitral valve spectral Doppler velocities. There is evidence of cardiac tamponade. Venous: The inferior vena cava is normal in size with less than 50% respiratory variability, suggesting right atrial pressure of 8 mmHg. LEFT VENTRICLE PLAX 2D LVIDd:         3.10 cm LVIDs:         1.70 cm LV PW:         1.50 cm LV IVS:        1.70 cm  LEFT ATRIUM         Index LA diam:    2.60 cm 1.49 cm/m   AORTA Ao Root diam: 3.20 cm MITRAL VALVE MV Area (PHT): 5.38 cm MV Decel Time: 141 msec MV E velocity: 114.00 cm/s MV A velocity: 71.10 cm/s MV E/A ratio:  1.60 Fransico Him MD Electronically signed by Fransico Him MD Signature Date/Time: 11/30/2020/2:46:19 PM    Final     Cardiac Studies   Multiple echos above  Patient Profile     80 y.o. male with a hx of aortic valve stenosis, bifascicular block, hypertension, hyperlipidemia, history of coronary artery calcification seen on CT scan, lung cancer, history of hepatitis B, severe PAD and history of sinus pauses seen on heart monitor who is being seen 12/22/2020 for the  evaluation of PEA arrest and shock in the setting of ischemic bowel and intraperitoneal hemorrhage.  Assessment & Plan    1. PEA arrest - triggered by ischemic bowel and bleeding. No further arrhythmia today.  2. Bifascicular block (chronic) - Monitor for high grade AV block.  3. Pericardial effusion - pending repeat echocardiogram, but neither TTE nor TEE did not show tamponade in my opinion.  4. Elevated troponin - followin CPR.  Recent nuclear study showed no ischemia. 5. acute kidney injury - followed by nephrology.  On CRRT. 6. Hemorrhagic/septic shock - due to necrotic bowel, s/p laparotomy and colectomy.     CRITICAL CARE Performed by: Dani Gobble Jayzon Taras   Total critical care time: 45 minutes  Critical care time was exclusive of separately billable procedures and treating other patients.  Critical care was necessary to treat or prevent imminent or life-threatening deterioration.  Critical care was time spent personally by me on the following activities: development of treatment plan with patient and/or surrogate as well as nursing, discussions with consultants, evaluation of patient's response to treatment, examination of patient, obtaining history from patient or surrogate, ordering and performing treatments and interventions, ordering and review of laboratory studies, ordering and review of radiographic studies, pulse oximetry and re-evaluation of patient's condition.   For questions or updates, please contact [place] Please consult [email or web address] for contact info under        Signed, Sanda Klein, MD  12/02/2020, 11:32 AM

## 2020-12-02 NOTE — Progress Notes (Signed)
NAME:  Brian Yates, MRN:  [id number], DOB:  1940-11-26, LOS: 3 ADMISSION DATE:  12/26/2020, CONSULTATION DATE:  11/30/20 REFERRING MD:  Trula Slade - [facility] , CHIEF COMPLAINT:  Cardiac arrest   History of Present Illness:  Brian Yates is 80 yo M extensive PMH admitted to [facility] 6/1 for planned aorto bifemoral bypass in setting of aortoiliac and fem-pop occlusive disease. POD1 aortobifem bypass, reimplantation of IMA and bilateral femoral endarterectomies.The patient rapidly decompensated. On 6/2 CCM was called emergently to bedside by nursing staff and patient sustained a PEA/[place]ytole cardiac arrest. ROSC achieved <5 minutes, pt got 1 amp epi 1 amp bicarb and atropine, was TCP before stabilizing his HR minutes later.   At time of consultation, pt with new AKI, new leukocytosis, worsening hypoxia, encephalopathy and hemodynamic instability.   Pertinent  Medical History  EtOH abuse Gout Diverticulosis ED Hep B HLD HTN IBS Parkinsons  Occlusive vascular disease  Aortic valve stenosis RBBB CAD  Significant Hospital Events: Including procedures, antibiotic start and stop dates in addition to other pertinent events   . 6/1 planned aorto bifem bypass with reimplantation of IMA and bilateral common fem and profundofemoral endarterectomy. Small umbilical hernia repair. Post op significant leukocytosis WBC 38 . 6/2 poor doppler signals distal BLE, palpable fem. Cardiac arrest unclear cause. CCM consult during code. Intubated emergently TEE EF 70-75% hyperdynamic, ?moderate pericardial effusion. CT Chest w/ contrast- No aortic dissection, no tamponade, no PE, mesenteric vessels appear patient per vascular. Free air present>pt post laparotomy. Small amount of blood in abdomen. HD cath> Vanc> Cefe> . 6/3 2 PRBC, 1 cryo, 1 FFP, 500ML IVF, 25g albumin. Cardiology consulted- not ACS. OR for exploration> colectomy, diffusely ischemic appearing bowel  Interim History / Subjective:  OR yesterday- colectomy  due to ischemia.  Objective   Blood pressure 116/77, pulse (!) 102, temperature (!) 96.62 F (35.9 C), resp. rate (!) 35, height 5\' 8"  (1.727 m), weight 71.8 kg, SpO2 100 %.    Vent Mode: PRVC FiO2 (%):  [40 %] 40 % Set Rate:  [30 bmp-35 bmp] 35 bmp Vt Set:  [540 mL] 540 mL PEEP:  [5 cmH20] 5 cmH20 Plateau Pressure:  [14 cmH20-18 cmH20] 18 cmH20   Intake/Output Summary (Last 24 hours) at 12/02/2020 0710 Last data filed at 12/02/2020 0700 Gross per 24 hour  Intake 8183.65 ml  Output 4346 ml  Net 3837.65 ml   Filed Weights   11/30/20 0638 12/17/2020 0300 12/02/20 0300  Weight: 62.7 kg 69.6 kg 71.8 kg    Examination: General: critically ill appearing man lying in bed in NAD HEENT: [place]/AT, eyes anicteric Neuro: RASS -1, nods to answer questions but not following any commands.  CV: S1S2, RRR PULM:  tachypneic on SBT, desaturating to low 80s> recovered quickly when switched back to [facility]. GI: soft, NT, ND. Hypoactive BS. RLQ ostomy pink, minimal output. Extremities: New nonblanching cyanosis on distal L first toe. Palpable DP pulses bilaterally. No clubbing. Feet both warm, no edema. Skin: ex-lap site with staples, no significant erythema around incision.  Labs/imaging that I havepersonally reviewed  (right click and "Reselect all SmartList Selections" daily)   K+ 3.3 BUN 14 Cr 1.6 (on CRRT) Bicarb 23 LA 4.2, downtrending CK 15,494 AST 2518 ALT 794 T. Bili 1.8  Resolved Hospital Problem list     Assessment & Plan:   Acute respiratory failure with hypoxia requiring MV post-cardiac arrest. Adenocarcinoma of the lung s/p radiation x3  Tobacco abuse; likely has undiagnosed COPD -LTVV,  4-8c/kg IBW with goal Pplat <30 nad DP<15 -VAP prevention protocol -PAD protocol for sedation; appropriate off meds this AM -daily SAT & SBT; failed today due to desaturation and increasing pressor requirements -con't nebs given previous obstruction limiting ventilation  Shock- likely  distributive from mesenteric ischemia; possibly also hemorrhagic from intraperitoneal bleeding -con't broad spectrum antimicrobials given colectomy, recent vascular grafts -con't vasopressors as required to maintain MAP >65 -Aline required  Acute metabolic encephalopathy > improving Suspect secondary to hypoperfusion post arrest and metabolic derangements  Hx parkinsons  -optimize metabolic situation -PAD protocol for sedation  Cardiac arrest> likely due to colon ischemia? Distributive shock Pericardial effusion, new post-arrest, no tamponade Lactic Acidosis ROSC <84min, unclear etiology but reported c/o chest pain prior. ?respiratory arrest per nursing. Moving extremities after ROSC Troponin (high sens) 302>955 post CPR. CT - for PE.  -Appreciate cardiology's assistance -con't supportive care -tele monitoring  Acute blood loss anemia Thombocytopenia Coagulopathy All consumptive due to bleeding -serial CBCs -no additional blood products required this morning  S/p aortobifemoral bypass grafting with reimplantation of IMA S/p bilateral common femoral and profundofemoral endarterectomy P -Management per vascular surgery  Elevated transaminses- shock liver -trend LFTs -maintain adequate perfusion  Leukocytosis - reactive -con't antibiotics  AKI Lactic acidosis> improving rapidly -CRRT per nephrology -strict I/Os -renally dose meds, avoid nephrotoxic meds   Hypomagnesemia-resolved  Hyponatremia - resolved Hypocalcemia- repleted -daily BMP  Hx HTN Hx RBBB chronically Hx nonrheumatic aortic valve stenosis  - hold antihypertensives in the setting of shock. -Continue Tele and ICU monitoring.    Best practice (right click and "Reselect all SmartList Selections" daily)  Diet:  NPO Pain/Anxiety/Delirium protocol (if indicated): Yes (RASS goal 0) VAP protocol (if indicated): Yes DVT prophylaxis: Subcutaneous Heparin GI prophylaxis: PPI Glucose control:  SSI  Yes Central venous access:  Yes, and it is still needed Arterial line:  Yes, and it is still needed Foley:  Yes, and it is still needed Mobility:  bed rest  PT consulted: N/A Last date of multidisciplinary goals of care discussion [per primary, no family at bedside this morning] Code Status:  full code Disposition: ICU       This patient is critically ill with multiple organ system failure which requires frequent high complexity decision making, assessment, support, evaluation, and titration of therapies. This was completed through the application of advanced monitoring technologies and extensive interpretation of multiple databases. During this encounter critical care time was devoted to patient care services described in this note for 41 minutes.  Julian Hy, DO 12/02/20 10:55 AM [facility]  For contact information, see Amion. If no response to pager, please call PCCM consult pager. After hours, 7PM- 7AM, please call Elink.

## 2020-12-02 NOTE — Plan of Care (Signed)

## 2020-12-02 NOTE — Progress Notes (Signed)
Patient ID: Brian Yates, male   DOB: 03-May-1941, 80 y.o.   MRN: [id number]  [facility] Progress Note   Assessment/ Plan:   1. Acute kidney Injury: Anuric with 40 cc urine output overnight, etiology likely from cardiac arrest and consequent ischemic ATN.  I will continue CRRT and make changes to the dialysate/pre and post filter fluids to adjust for potassium.  Continue efforts at ultrafiltration to try and get him euvolemic. 2.  Anion gap metabolic acidosis: Improving slowly on CRRT-we will make adjustments to pre and post filter fluids.. 3.  Cardiac arrest/shock: Weaning down pressors, on CRRT for management of metabolic acidosis that is expected to improve further status post a subtotal colectomy. 4.  Status post aortobifemoral bypass grafting with reimplantation of IMA along with bilateral common femoral and profundofemoral endarterectomy.  Ongoing close monitoring by vascular surgery. 5.  Anemia: Received blood products overnight perioperatively with excellent hemoglobin and hematocrit at this time. 6.  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  Overnight, improving hemodynamic status noted.   Objective:   BP 112/71 (BP Location: Left Arm)   Pulse 98   Temp (!) 97.16 F (36.2 C) (Rectal)   Resp (!) 35   Ht _0  (1.727 m)   Wt 71.8 kg   SpO2 100%   BMI 24.07 kg/m   Intake/Output Summary (Last 24 hours) at 12/02/2020 0815 Last data filed at 12/02/2020 0715 Gross per 24 hour  Intake 7988.81 ml  Output 4632 ml  Net 3356.81 ml   Weight change: 2.2 kg  Physical Exam: Gen: Intubated, sedated, appears comfortable CVS: Regular tachycardia, S1 and S2 with ejection systolic murmur Resp: Anteriorly clear to auscultation, no rales/rhonchi Abd: Midline surgical incision appears well opposed/clean, no audible  bowel sounds Ext: Trace lower extremity edema  Imaging: DG Chest Port 1 View  Result Date: 11/30/2020 CLINICAL DATA:  Status post central line placement EXAM: PORTABLE CHEST 1 VIEW COMPARISON:  Film from earlier in the same day. FINDINGS: Endotracheal tube, gastric catheter and left jugular central line are again identified and stable. New right jugular dialysis catheter is seen in the proximal superior vena cava. No pneumothorax is noted. The lungs are well aerated without focal infiltrate. Cardiac shadow is stable. IMPRESSION: No pneumothorax following right central line placement. Catheter tip is noted in the proximal superior vena cava. Electronically Signed   By: Inez Catalina M.D.   On: 11/30/2020 17:24   Portable Chest x-ray  Result Date: 11/30/2020 CLINICAL DATA:  Endotracheal tube placement EXAM: PORTABLE CHEST 1 VIEW COMPARISON:  11/30/2020 FINDINGS: Endotracheal tube in good position.  NG tube enters the stomach. Left neck central venous catheter tip projects to the left of the aortic arch. 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 Arterial placement not excluded. No pneumothorax. Lungs remain clear without infiltrate or effusion. Mild scarring on the left. IMPRESSION: Endotracheal tube in good position Left neck central venous catheter tip projects to the left of the aortic arch which may be in a duplicated SVC. Arterial position not excluded. Electronically Signed   By: Franchot Gallo M.D.   On: 11/30/2020 14:15   ECHO TEE  Result Date: 11/30/2020    TRANSESOPHOGEAL ECHO REPORT   Patient Name:   Brian Yates Date of Exam: 11/30/2020 Medical Rec #:  [id number]        Height:  68.0 in Accession #:    [id number]       Weight:       138.2 lb Date of Birth:  1941-01-31        BSA:          1.747 m Patient Age:    50 years         BP:           132/79 mmHg Patient Gender: M                HR:           135 bpm. Exam Location:  Inpatient Procedure: Transesophageal Echo, Cardiac Doppler and Color  Doppler Indications:     Thoracic aortic aneurysm, I71.1  History:         Patient has prior history of Echocardiogram examinations, most                  recent 11/30/2020. Aortic Valve Disease; Risk Factors:Current                  Smoker, Dyslipidemia and Hypertension. Aortobifemoral bypass                  grafting with reimplantation of IMA.  Sonographer:     Darlina Sicilian RDCS Referring Phys:  [id number] [name] Diagnosing Phys: Rudean Haskell MD PROCEDURE: After discussion of the risks and benefits of a TEE, an informed consent was obtained from a family member. The patient was intubated. TEE procedure time was 38 minutes. The transesophogeal probe was passed without difficulty through the esophogus of the patient. Imaged were obtained with the patient in a supine position. Sedation performed by different physician. Patients was under conscious sedation during this procedure. Anesthetic administered: 30mg of Fentanyl. Image quality was technically difficult. The patient's vital signs; including heart rate, blood pressure, and oxygen saturation; remained stable throughout the procedure. The patient developed no complications during the procedure. IMPRESSIONS  1. Moderate pericardial effusion. The pericardial effusion is anterior to the right ventricle. There is significant change in respirophasic valvular inflow velocities; coronary sinus diameter 8 mm, no RV or RA collapse suggestive of increase pericardial  pressure.  2. 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 In the clinic syndrome of pressor administration and with increase in all valvular gradients, less suggestive of anatomic obstruction and favored to be related to a high flow state.  3. Left ventricular ejection fraction, by estimation, is 70 to 75%. The left ventricle has hyperdynamic function. The left ventricle has no regional wall motion  abnormalities. There is severe concentric left ventricular hypertrophy.  4. Right ventricular systolic function is hyperdynamic. The right ventricular size is normal.  5. No left atrial/left atrial appendage thrombus was detected.  6. The mitral valve is abnormal. No evidence of mitral valve regurgitation.  7. The aortic valve is tricuspid. There is mild calcification of the aortic valve. There is moderate thickening of the aortic valve. Aortic valve regurgitation is trivial.  8. Aortic dilatation noted. There is Moderate (Grade III) plaque involving the descending aorta.  9. Emergent study done without evidence of interatrial communication- aggitated saline contrast deferred. FINDINGS  Left Ventricle: Left ventricular ejection fraction, by estimation, is 70 to 75%. The left ventricle has hyperdynamic function. The left ventricle has no regional wall motion abnormalities. The left ventricular internal cavity size was small. There is severe concentric left ventricular hypertrophy.  Right Ventricle: The right ventricular size is normal. Right vetricular wall thickness was not assessed. Right ventricular systolic function is hyperdynamic. Left Atrium: Left atrial size was normal in size. No left atrial/left atrial appendage thrombus was detected. Right Atrium: Right atrial size was normal in size. Pericardium: A moderately sized pericardial effusion is present. The pericardial effusion is anterior to the right ventricle. There is no evidence of cardiac tamponade. Mitral Valve: The mitral valve is abnormal. There is mild thickening of the mitral valve leaflet(s). Mild mitral annular calcification. No evidence of mitral valve regurgitation. Tricuspid Valve: The tricuspid valve is grossly normal. Tricuspid valve regurgitation is not demonstrated. Aortic Valve: The aortic valve is tricuspid. There is mild calcification of the aortic valve. There is moderate thickening of the aortic valve. There is mild to moderate aortic  valve annular calcification. Aortic valve regurgitation is trivial. Pulmonic Valve: The pulmonic valve was grossly normal. Pulmonic valve regurgitation is not visualized. 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 Aorta: Aortic dilatation noted and the aortic root and ascending aorta are structurally normal, with no evidence of dilitation. There is moderate (Grade III) plaque involving the descending aorta. IAS/Shunts: There is left bowing of the interatrial septum, suggestive of elevated right atrial pressure. The atrial septum is grossly normal.   AORTA Ao Root diam: 3.80 cm Ao Asc diam:  2.80 cm MITRAL VALVE MV Area (PHT): 5.97 cm MV Decel Time: 127 msec MV E velocity: 194.00 cm/s Rudean Haskell MD Electronically signed by Rudean Haskell MD Signature Date/Time: 11/30/2020/6:59:58 PM    Final    CT Angio Chest/Abd/Pel for Dissection W and/or W/WO  Result Date: 11/30/2020 CLINICAL DATA:  Abdominal pain. Chest pain. Hypoactive bowel, cardiac arrest. Postop day 1 from aorto bifem bypass. EXAM: CT ANGIOGRAPHY CHEST, ABDOMEN AND PELVIS TECHNIQUE: Non-contrast CT of the chest was initially obtained. Multidetector CT imaging through the chest, abdomen and pelvis was performed using the standard protocol during bolus administration of intravenous contrast. Multiplanar reconstructed images and MIPs were obtained and reviewed to evaluate the vascular anatomy. CONTRAST:  51m OMNIPAQUE IOHEXOL 350 MG/ML SOLN COMPARISON:  Radiographs earlier today. Abdominopelvic CTA 11/16/2020, chest CT 11/03/2020 FINDINGS: CTA CHEST FINDINGS Cardiovascular: Advanced aortic atherosclerosis. This includes irregular plaque involving the distal thoracic aorta which is peripherally calcified, not significantly changed from prior exam. No aortic hematoma. No aortic aneurysm, dissection, or evidence of acute aortic syndrome. Conventional branching pattern from the aortic arch with patent branch vessels. No central pulmonary embolus to the  segmental level. Heart is normal in size. Small pericardial effusion measures up to 17 mm anteriorly. Left internal jugular venous catheter courses into the hemi azygous system in terminates just above the thoracic aorta. There is no adjacent perivascular stranding. Mediastinum/Nodes: Enteric tube within the esophagus which is patulous and air-filled. Endotracheal tube tip is above the carina. Minimal mucus within the distal trachea and right mainstem bronchus. No mediastinal or hilar adenopathy. No suspicious thyroid nodule. Lungs/Pleura: Apical predominant emphysema, at least moderate in degree. Spiculated nodule in the lingula measures 15 x 12 mm, series 8, image 72, not significantly changed. Adjacent ground-glass opacity in the lingula is stable. Smaller adjacent ground-glass nodule on prior CT is not significantly changed, including 7 mm on series 8, image 68. Two adjacent subpleural pulmonary nodules in the right lower lobe, largest measuring 7 mm series 8, image 57 again seen. There is a new subpleural nodule in the right lower lobe measuring 5 mm, series 8, image 77. There are small bilateral pleural  effusions with adjacent compressive atelectasis. No pneumothorax. No findings of pulmonary edema. Minimal retained mucus in the distal trachea and right mainstem bronchus. Musculoskeletal: Cortical buckling of the sternal body is new from prior and likely related to CPR. No rib fractures. Lucency in the anterior left glenoid may represent a subchondral cyst, but is nonspecific, series 8, image 5 Review of the MIP images confirms the above findings. CTA ABDOMEN AND PELVIS FINDINGS VASCULAR Aorta: Advanced aortic atherosclerosis including calcified noncalcified plaque in the upper abdominal aorta projecting into the aortic lumen. No upper abdominal stenosis. Aorto bifem graft with adjacent air and stranding, likely related to surgery yesterday. There is no evidence of active extravasation or aortic leak. Aortic  graft is patent. Ill-defined air in fluid adjacent to the upper aspect of the graft native distal aorta is densely calcified. Celiac: Plaque at the origin causes mild to moderate stenosis with poststenotic dilatation. Celiac dilatation of 10 mm just proximal to the hepatic splenic bifurcation. The hepatic artery is diminutive and not well opacified. SMA: Patent with heavily calcified plaque proximally. Distal branches are diminutive. No evidence of thrombus or embolic disease. No severe stenosis proximally. Renals: Patent with moderate plaque. Distal branches are diminutive. IMA: Patent arising from the bypass graft. Inflow: Bi femoral bypass graft is patent without stenosis or dissection. Air and stranding adjacent to the graft is likely related to recent surgery. Veins: Unopacified and not well assessed. There is no portal venous or mesenteric gas. Review of the MIP images confirms the above findings. NON-VASCULAR Hepatobiliary: No obvious focal abnormality on arterial phase imaging. Distended gallbladder without inflammation or calcified stone. Pancreas: No ductal dilatation or inflammation. Spleen: Calcified granuloma. Normal in size. Adrenals/Urinary Tract: No adrenal nodule. Slightly hyperdense adrenal glands. Heterogeneous with suspected bilateral cysts, not well assessed on this arterial phase exam. No hydronephrosis. Foley catheter decompresses the urinary bladder. Stomach/Bowel: Enteric tube within the stomach. There is no definite gastric wall thickening. Heterogeneous appearance of small bowel which appears clumped and has interposed high-density fluid. There is no obvious bowel pneumatosis. No obstruction. Colonic diverticulosis without diverticulitis. Equivocal wall thickening about the splenic flexure of the colon. Lymphatic: Limited assessment for adenopathy on the current exam. Reproductive: Prostate is unremarkable. Other: Moderate volume of complex free fluid suspicious for blood present in both  upper quadrants, tracking in the pericolic gutters and in the pelvis. Ill-defined fluid insinuating throughout small bowel suspected, not well-defined. There is moderate amount of free air, greatest under the hemidiaphragms, as well as scattered throughout the mesentery. Fluid tracks into the left inguinal canal. Suspected small bowel loop extending into the left inguinal canal, better appreciated on prior exam. Musculoskeletal: Degenerative change in the spine. There are no acute or suspicious osseous abnormalities. Review of the MIP images confirms the above findings. IMPRESSION: 1. Post recent aorto bifem bypass graft, the graft is patent. Air and stranding adjacent to the graft is likely related to recent surgery. No active arterial extravasation. 2. Free intra-abdominal air as well as moderate volume hemoperitoneum within both upper quadrants, tracking into the pelvis and mesentery. Mesenteric blood is difficult to differentiate from adjacent small bowel loops. Findings are suspicious for bowel perforation, though the site of perforation is not localized on the current exam. Bowel ischemia suspected in this clinical scenario, but otherwise no bowel pneumatosis or obvious site of ischemic bowel. 3. Left internal jugular venous catheter courses into the hemi azygous system in the upper chest, terminates just above the thoracic aorta. 4. Additional nonacute  findings as described above. Aortic Atherosclerosis (ICD10-I70.0) and Emphysema (ICD10-J43.9). These results were called by telephone at the time of interpretation on 11/30/2020 at 5:12 pm to provider GRACE BOWSER , who verbally acknowledged these results. Electronically Signed   By: Keith Rake M.D.   On: 11/30/2020 17:14   ECHOCARDIOGRAM LIMITED  Result Date: 11/30/2020    ECHOCARDIOGRAM LIMITED REPORT   Patient Name:   ARGENIS KUMARI Date of Exam: 11/30/2020 Medical Rec #:  [id number]        Height:       68.0 in Accession #:    [id number]       Weight:        138.2 lb Date of Birth:  1941-03-03        BSA:          1.747 m Patient Age:    19 years         BP:           60/31 mmHg Patient Gender: M                HR:           81 bpm. Exam Location:  Inpatient Procedure: Limited Echo STAT ECHO Indications:    Cardiac Arrest  History:        Patient has prior history of Echocardiogram examinations, most                 recent 11/28/2020. Risk Factors:Current Smoker, Hypertension and                 Dyslipidemia. Aortic Stenosis, Aortobifemoral bypass grafting                 with reimplantation of IMA, bilateral common femoral and                 profundo-femoral endarterectomy 11/29/2020.  Sonographer:    Leavy Cella Referring Phys: Sabin  1. Left ventricular ejection fraction, by estimation, is >75%. The left ventricle has hyperdynamic function. There is moderate concentric left ventricular hypertrophy.  2. Right ventricular systolic function is normal. The right ventricular size is small and underfilled. Tricuspid regurgitation signal is inadequate for assessing PA pressure.  3. The inferior vena cava is normal in size with <50% respiratory variability, suggesting right atrial pressure of 8 mmHg.  4. Small to moderate focal pericardial effusion is present. The pericardial effusion is anterior to the right ventricle and appears to have some fibrinous material in the pericardium as well. The RV and LV are underfilled. Images in the parasternal long  axix are worrisome for diastolic RV collapse. There is variation in the MV inflow velocities with respirations and the IVC does not collapse but is normal in size. Findings are worrisome for percaridal tamponade.In the setting of new pericardial effusion (no effusion by echo a few days ago) and chest pain with cardiac arrest after aortic surgery, recommend TEE (patient in renal failure) to rule out aortic dissection. FINDINGS  Left Ventricle: Left ventricular ejection fraction, by estimation,  is >75%. The left ventricle has hyperdynamic function. The left ventricular internal cavity size was small. There is moderate concentric left ventricular hypertrophy. Right Ventricle: The right ventricular size is normal. Right ventricular systolic function is normal. Tricuspid regurgitation signal is inadequate for assessing PA pressure. Pericardium: A moderately sized pericardial effusion is present. The pericardial effusion is anterior to the right ventricle. There is diastolic collapse of the right ventricular free  wall and excessive respiratory variation in the mitral valve spectral Doppler velocities. There is evidence of cardiac tamponade. Venous: The inferior vena cava is normal in size with less than 50% respiratory variability, suggesting right atrial pressure of 8 mmHg. LEFT VENTRICLE PLAX 2D LVIDd:         3.10 cm LVIDs:         1.70 cm LV PW:         1.50 cm LV IVS:        1.70 cm  LEFT ATRIUM         Index LA diam:    2.60 cm 1.49 cm/m   AORTA Ao Root diam: 3.20 cm MITRAL VALVE MV Area (PHT): 5.38 cm MV Decel Time: 141 msec MV E velocity: 114.00 cm/s MV A velocity: 71.10 cm/s MV E/A ratio:  1.60 Fransico Him MD Electronically signed by Fransico Him MD Signature Date/Time: 11/30/2020/2:46:19 PM    Final     Labs: BMET Recent Labs  Lab 11/30/20 1227 11/30/20 1337 11/30/20 1844 11/30/20 2157 12/13/2020 0513 12/06/2020 0800 12/28/2020 1115 12/20/2020 1456 12/20/2020 1550 12/07/2020 1625 11/30/2020 1826 12/08/2020 1900 12/02/20 0527  NA 139   < > 137 135 134*   < > 136 135 135 138 137 136 136  K 6.2*   < > 4.5 4.2 4.3   < > 3.3* 3.6 3.8 3.8 3.5 3.6 3.3*  CL 107  --  104 102 102  --  103  --   --   --   --  100 101  CO2 14*  --  16* 19* 17*  --  20*  --   --   --   --  18* 23  GLUCOSE 139*  --  128* 135* 107*  --  92  --   --   --   --  79 83  BUN 25*  --  30* 28* 20  --  13  --   --   --   --  17 14  CREATININE 2.83*  --  3.08* 2.85* 2.37*  --  1.65*  --   --   --   --  1.98* 1.60*  CALCIUM 8.0*   --  8.8* 8.3* 7.3*  --  7.0*  --   --   --   --  7.8* 7.5*  PHOS  --   --   --   --  4.9*  --   --   --   --   --   --  6.8* 4.2   < > = values in this interval not displayed.   CBC Recent Labs  Lab 12/15/2020 1115 12/11/2020 1456 12/21/2020 1826 12/22/2020 1900 12/17/2020 2347 12/02/20 0527  WBC 39.8*  --   --  14.3* 17.1* 19.9*  HGB 7.8*   < > 10.2* 10.8* 11.7* 12.3*  HCT 24.3*   < > 30.0* 32.7* 33.1* 34.7*  MCV 91.0  --   --  90.1 86.2 86.3  PLT 76*  --   --  42* 47* 65*   < > = values in this interval not displayed.    Medications:    . sodium chloride   Intravenous Once  . sodium chloride   Intravenous Once  . chlorhexidine gluconate (MEDLINE KIT)  15 mL Mouth Rinse BID  . Chlorhexidine Gluconate Cloth  6 each Topical Daily  . docusate  100 mg Per Tube BID  . heparin  5,000 Units Subcutaneous Q8H  .  insulin aspart  1-3 Units Subcutaneous Q4H  . ipratropium-albuterol  3 mL Nebulization Q6H  . mouth rinse  15 mL Mouth Rinse 10 times per day  . pantoprazole (PROTONIX) IV  40 mg Intravenous QHS  . polyethylene glycol  17 g Per Tube Daily  . sodium chloride flush  10-40 mL Intracatheter Q12H   Elmarie Shiley, MD 12/02/2020, 8:15 AM

## 2020-12-02 NOTE — Progress Notes (Signed)
Shift Summary: weaned off sedation, SBT twice  N: Alert, nods appropriately and starting to communicate needs, MAE spontaneously, LLE weakly following commands. Cold at 35-36C, prismaflex warmer and bair hugger on. PERRLA 3/brisk.   R: ETT in place. SBT this AM for an hour but ended for desatting and hypotension. SBT this evening from 1430 through next shift, satting 98% and BP stable on pressors. Clear/dim lungs, no secretions.   CV: ST 90-120 minimal ectopy. BP requiring levophed and epi gtt. Hypotension this AM, resolved after albumin. 1+ palpable pulses, all extremities warm/dry throughout. RUE slightly weeping.   GI: NPO until cleared by general surgery. NGT to LIS, no output. No BM. BG 60s, started on d10w gtt. Illeostomy in place, stoma bright red/prink, site CDI, scant brown output.  GU: foley in place, no output. Requested to remove foley with CCM team, was told to hold off for monitoring of urine production. CRRT goal to run even or slightly negative; ending shift -238mL. K+ baths changed this AM by nephrology for hypokalemia. Recheck this evening WDL.   Skin: Skin intact, no new skin issues.  Family at bedside throughout shift, also visited by home clergy. Updated additional family members via phone.   Praise Stennett RN

## 2020-12-03 ENCOUNTER — Other Ambulatory Visit: Payer: Self-pay

## 2020-12-03 DIAGNOSIS — R579 Shock, unspecified: Secondary | ICD-10-CM | POA: Diagnosis not present

## 2020-12-03 DIAGNOSIS — Z9911 Dependence on respirator [ventilator] status: Secondary | ICD-10-CM | POA: Diagnosis not present

## 2020-12-03 DIAGNOSIS — K559 Vascular disorder of intestine, unspecified: Secondary | ICD-10-CM | POA: Diagnosis not present

## 2020-12-03 DIAGNOSIS — J9601 Acute respiratory failure with hypoxia: Secondary | ICD-10-CM | POA: Diagnosis not present

## 2020-12-03 LAB — POCT I-STAT 7, (LYTES, BLD GAS, ICA,H+H)
Acid-base deficit: 1 mmol/L (ref 0.0–2.0)
Acid-base deficit: 2 mmol/L (ref 0.0–2.0)
Bicarbonate: 29.1 mmol/L — ABNORMAL HIGH (ref 20.0–28.0)
Bicarbonate: 26.1 mmol/L (ref 20.0–28.0)
Calcium, Ion: 0.99 mmol/L — ABNORMAL LOW (ref 1.15–1.40)
Calcium, Ion: 0.9 mmol/L — ABNORMAL LOW (ref 1.15–1.40)
HCT: 35 % — ABNORMAL LOW (ref 39.0–52.0)
HCT: 31 % — ABNORMAL LOW (ref 39.0–52.0)
Hemoglobin: 11.9 g/dL — ABNORMAL LOW (ref 13.0–17.0)
Hemoglobin: 10.5 g/dL — ABNORMAL LOW (ref 13.0–17.0)
O2 Saturation: 83 %
O2 Saturation: 100 %
Patient temperature: 37.1
Patient temperature: 36.2
Potassium: 3.9 mmol/L (ref 3.5–5.1)
Potassium: 5.4 mmol/L — ABNORMAL HIGH (ref 3.5–5.1)
Sodium: 136 mmol/L (ref 135–145)
Sodium: 135 mmol/L (ref 135–145)
TCO2: 31 mmol/L (ref 22–32)
TCO2: 28 mmol/L (ref 22–32)
pCO2 arterial: 79.3 mmHg (ref 32.0–48.0)
pCO2 arterial: 58.5 mmHg — ABNORMAL HIGH (ref 32.0–48.0)
pH, Arterial: 7.172 — CL (ref 7.350–7.450)
pH, Arterial: 7.252 — ABNORMAL LOW (ref 7.350–7.450)
pO2, Arterial: 62 mmHg — ABNORMAL LOW (ref 83.0–108.0)
pO2, Arterial: 299 mmHg — ABNORMAL HIGH (ref 83.0–108.0)

## 2020-12-03 LAB — RENAL FUNCTION PANEL
Albumin: 2.2 g/dL — ABNORMAL LOW (ref 3.5–5.0)
Anion gap: 8 (ref 5–15)
BUN: 10 mg/dL (ref 8–23)
CO2: 26 mmol/L (ref 22–32)
Calcium: 7.1 mg/dL — ABNORMAL LOW (ref 8.9–10.3)
Chloride: 101 mmol/L (ref 98–111)
Creatinine, Ser: 1.07 mg/dL (ref 0.61–1.24)
GFR, Estimated: >60 mL/min (ref >60)
Glucose, Bld: 77 mg/dL (ref 70–99)
Phosphorus: 4.4 mg/dL (ref 2.5–4.6)
Potassium: 5.3 mmol/L — ABNORMAL HIGH (ref 3.5–5.1)
Sodium: 135 mmol/L (ref 135–145)

## 2020-12-03 LAB — COMPREHENSIVE METABOLIC PANEL
ALT: 555 U/L — ABNORMAL HIGH (ref 0–44)
AST: 2390 U/L — ABNORMAL HIGH (ref 15–41)
Albumin: 2.3 g/dL — ABNORMAL LOW (ref 3.5–5.0)
Alkaline Phosphatase: 99 U/L (ref 38–126)
Anion gap: 11 (ref 5–15)
BUN: 14 mg/dL (ref 8–23)
CO2: 25 mmol/L (ref 22–32)
Calcium: 6.6 mg/dL — ABNORMAL LOW (ref 8.9–10.3)
Chloride: 98 mmol/L (ref 98–111)
Creatinine, Ser: 1.42 mg/dL — ABNORMAL HIGH (ref 0.61–1.24)
GFR, Estimated: 51 mL/min — ABNORMAL LOW (ref >60)
Glucose, Bld: 83 mg/dL (ref 70–99)
Potassium: 5 mmol/L (ref 3.5–5.1)
Sodium: 134 mmol/L — ABNORMAL LOW (ref 135–145)
Total Bilirubin: 2.7 mg/dL — ABNORMAL HIGH (ref 0.3–1.2)
Total Protein: 4.3 g/dL — ABNORMAL LOW (ref 6.5–8.1)

## 2020-12-03 LAB — GLUCOSE, CAPILLARY
Glucose-Capillary: 102 mg/dL — ABNORMAL HIGH (ref 70–99)
Glucose-Capillary: 138 mg/dL — ABNORMAL HIGH (ref 70–99)
Glucose-Capillary: 62 mg/dL — ABNORMAL LOW (ref 70–99)
Glucose-Capillary: 74 mg/dL (ref 70–99)
Glucose-Capillary: 80 mg/dL (ref 70–99)
Glucose-Capillary: 86 mg/dL (ref 70–99)
Glucose-Capillary: 86 mg/dL (ref 70–99)

## 2020-12-03 LAB — CBC
HCT: 32 % — ABNORMAL LOW (ref 39.0–52.0)
Hemoglobin: 10.4 g/dL — ABNORMAL LOW (ref 13.0–17.0)
MCH: 30.1 pg (ref 26.0–34.0)
MCHC: 32.5 g/dL (ref 30.0–36.0)
MCV: 92.5 fL (ref 80.0–100.0)
Platelets: 63 10*3/uL — ABNORMAL LOW (ref 150–400)
RBC: 3.46 MIL/uL — ABNORMAL LOW (ref 4.22–5.81)
RDW: 17 % — ABNORMAL HIGH (ref 11.5–15.5)
WBC: 25.1 10*3/uL — ABNORMAL HIGH (ref 4.0–10.5)
nRBC: 1.3 % — ABNORMAL HIGH (ref 0.0–0.2)

## 2020-12-03 LAB — BASIC METABOLIC PANEL
Anion gap: 10 (ref 5–15)
BUN: 14 mg/dL (ref 8–23)
CO2: 25 mmol/L (ref 22–32)
Calcium: 6.5 mg/dL — ABNORMAL LOW (ref 8.9–10.3)
Chloride: 100 mmol/L (ref 98–111)
Creatinine, Ser: 1.45 mg/dL — ABNORMAL HIGH (ref 0.61–1.24)
GFR, Estimated: 49 mL/min — ABNORMAL LOW (ref >60)
Glucose, Bld: 88 mg/dL (ref 70–99)
Potassium: 5.5 mmol/L — ABNORMAL HIGH (ref 3.5–5.1)
Sodium: 135 mmol/L (ref 135–145)

## 2020-12-03 LAB — DIC (DISSEMINATED INTRAVASCULAR COAGULATION)PANEL
D-Dimer, Quant: 3.67 ug/mL-FEU — ABNORMAL HIGH (ref 0.00–0.50)
Fibrinogen: 411 mg/dL (ref 210–475)
INR: 1.5 — ABNORMAL HIGH (ref 0.8–1.2)
Platelets: 63 10*3/uL — ABNORMAL LOW (ref 150–400)
Prothrombin Time: 17.7 seconds — ABNORMAL HIGH (ref 11.4–15.2)
Smear Review: NONE SEEN
aPTT: 49 seconds — ABNORMAL HIGH (ref 24–36)

## 2020-12-03 LAB — PHOSPHORUS
Phosphorus: 4.8 mg/dL — ABNORMAL HIGH (ref 2.5–4.6)
Phosphorus: 5.4 mg/dL — ABNORMAL HIGH (ref 2.5–4.6)

## 2020-12-03 LAB — MAGNESIUM
Magnesium: 2.4 mg/dL (ref 1.7–2.4)
Magnesium: 2.4 mg/dL (ref 1.7–2.4)

## 2020-12-03 LAB — TROPONIN I (HIGH SENSITIVITY): Troponin I (High Sensitivity): 1268 ng/L (ref <18)

## 2020-12-03 LAB — CK: Total CK: >50000 U/L — ABNORMAL HIGH (ref 49–397)

## 2020-12-03 MED ORDER — CALCIUM GLUCONATE-NACL 1-0.675 GM/50ML-% IV SOLN
1.0000 g | Freq: Once | INTRAVENOUS | Status: AC
  Administered 2020-12-03: 1000 mg via INTRAVENOUS
  Filled 2020-12-03: qty 50

## 2020-12-03 MED ORDER — DEXTROSE 50 % IV SOLN: 50.0000 mL | Freq: Once | INTRAVENOUS | Status: AC

## 2020-12-03 MED ORDER — FENTANYL CITRATE (PF) 100 MCG/2ML IJ SOLN
12.5000 ug | INTRAMUSCULAR | Status: AC | PRN
  Administered 2020-12-03: 12.5 ug via INTRAVENOUS
  Filled 2020-12-03: qty 2

## 2020-12-03 MED ORDER — DEXTROSE 50 % IV SOLN
INTRAVENOUS | Status: AC
  Administered 2020-12-03: 50 mL via INTRAVENOUS
  Filled 2020-12-03: qty 50

## 2020-12-03 MED ORDER — PRISMASOL BGK 0/2.5 32-2.5 MEQ/L EC SOLN
Status: DC
  Filled 2020-12-03 (×3): qty 5000

## 2020-12-03 MED ORDER — FENTANYL CITRATE (PF) 100 MCG/2ML IJ SOLN: 12.5000 ug | INTRAMUSCULAR | Status: DC | PRN

## 2020-12-03 NOTE — Progress Notes (Signed)
NAME:  Brian Yates, MRN:  [id number], DOB:  Oct 13, 1940, LOS: 4 ADMISSION DATE:  12/11/2020, CONSULTATION DATE:  11/30/20 REFERRING MD:  Trula Slade - [facility], CHIEF COMPLAINT:  Cardiac arrest   History of Present Illness:  Brian Yates is 80 yo M extensive PMH admitted to [facility] 6/1 for planned aorto bifemoral bypass in setting of aortoiliac and fem-pop occlusive disease. POD1 aortobifem bypass, reimplantation of IMA and bilateral femoral endarterectomies.The patient rapidly decompensated. On 6/2 CCM was called emergently to bedside by nursing staff and patient sustained a PEA/Asytole cardiac arrest. ROSC achieved <5 minutes, pt got 1 amp epi 1 amp bicarb and atropine, was TCP before stabilizing his HR minutes later.   At time of consultation, pt with new AKI, new leukocytosis, worsening hypoxia, encephalopathy and hemodynamic instability.   Pertinent  Medical History  EtOH abuse Gout Diverticulosis ED Hep B HLD HTN IBS Parkinsons  Occlusive vascular disease  Aortic valve stenosis RBBB CAD  Significant Hospital Events: Including procedures, antibiotic start and stop dates in addition to other pertinent events   . 6/1 planned aorto bifem bypass with reimplantation of IMA and bilateral common fem and profundofemoral endarterectomy. Small umbilical hernia repair. Post op significant leukocytosis WBC 38 . 6/2 poor doppler signals distal BLE, palpable fem. Cardiac arrest unclear cause. CCM consult during code. Intubated emergently TEE EF 70-75% hyperdynamic, ?moderate pericardial effusion. CT Chest w/ contrast- No aortic dissection, no tamponade, no PE, mesenteric vessels appear patient per vascular. Free air present>pt post laparotomy. Small amount of blood in abdomen. HD cath> Vanc> Cefe> . 6/3 2 PRBC, 1 cryo, 1 FFP, 500ML IVF, 25g albumin. Cardiology consulted- not ACS. OR for exploration> colectomy, diffusely ischemic appearing bowel . 6/4 awake off sedation, failed SBT, still on  CRRT  Interim History / Subjective:  He denies complaints this morning other than R arm pain causing him to not want to move his arm, although nurses have seen him reach for ETT with RUE.   Objective   Blood pressure 113/88, pulse (!) 106, temperature (!) 95 F (35 C), resp. rate (!) 21, height 5\' 8"  (1.727 m), weight 68.2 kg, SpO2 98 %.    Vent Mode: PRVC FiO2 (%):  [35 %-40 %] 40 % Set Rate:  [20 bmp] 20 bmp Vt Set:  [540 mL] 540 mL PEEP:  [5 cmH20] 5 cmH20 Pressure Support:  [5 cmH20-10 cmH20] 10 cmH20 Plateau Pressure:  [13 cmH20-14 cmH20] 14 cmH20   Intake/Output Summary (Last 24 hours) at 12/03/2020 0741 Last data filed at 12/03/2020 0600 Gross per 24 hour  Intake 4377.85 ml  Output 5982 ml  Net -1604.15 ml   Filed Weights   12/02/2020 0300 12/02/20 0300 12/03/20 0400  Weight: 69.6 kg 71.8 kg 68.2 kg    Examination: General: critically ill appearing man lying in bed in NAD HEENT: [place]/AT, eyes anicteric, ETT in place. Neuro: RASS 0, nods to answer questions, moving left arm, squeezes fingers on the right but not lifting arm off the bed. CV: S1S2, tachycardic, irreg rhythm PULM:  Tachypneic, synchronous with MV. Tolerating SBT PS 10 GI: soft, NT, ND. Extremities: worse mottling of toes but intact DP pulses bilaterally, no clubbing. bruising along R forearm. Skin: ex-lap incision without drainage or erythema  Labs/imaging that I havepersonally reviewed  (right click and "Reselect all SmartList Selections" daily)  Na+ 134 K+ 5.0 BUN 14 Cr 1.42 (on CRRT) Bicarb 25 AST 2390 ALT 555 T. Bili 2.7  Resolved Hospital Problem list  Assessment & Plan:   Acute respiratory failure with hypoxia requiring MV post-cardiac arrest. Adenocarcinoma of the lung s/p radiation x3  Tobacco abuse; likely has undiagnosed COPD -LTVV, 4-8cc/kg IBW with goal Pplat<30 and DP<15 -VAP prevention protocol -PAD protocol for sedation; appropriate off meds this AM -SAT & SBT daily- failed  SBT today due to hypoventilation, tachypnea -con't nebs given previous obstruction limiting ventilation  Shock- likely distributive from mesenteric ischemia; possibly also hemorrhagic from intraperitoneal bleeding -con't broad spectrum antimicrobials given colectomy & recent vascular grafts -con't vasopressors as required to maintain MAP >65 -A-line still needed -after repeat episode of suddenly worse hypotension, STAT labs sent, bolus 500cc IVF, CRRT washed back -Con't holding TF; NGT to LIWS. May have to consider TPN if prolonged time without enteral nutrition.   Acute metabolic encephalopathy > improving Suspect secondary to hypoperfusion post arrest and metabolic derangements  Hx parkinsons  -optimize metabolics -PAD protocol for sedation; doing well off sedation today  Cardiac arrest> likely due to colon ischemia? Distributive shock Pericardial effusion, new post-arrest, no tamponade; smaller on repeat echo on 6/4. Lactic Acidosis ROSC <39min, unclear etiology but reported c/o chest pain prior. ?respiratory arrest per nursing. Moving extremities after ROSC. CT - for PE.  -Appreciate cardiology's assistance -con't supportive care -tele monitoring  Acute blood loss anemia> stable.  Thombocytopenia Coagulopathy All consumptive due to bleeding -daily CBCs -no additional blood products required this morning -STAT CBC to recheck with sudden drop in BP  S/p aortobifemoral bypass grafting with reimplantation of IMA S/p bilateral common femoral and profundofemoral endarterectomy P -Management per vascular surgery  Elevated transaminses- shock liver -serial LFTs -maintain adequate perfusion with pressors  Leukocytosis - reactive -con't antibiotics x 1 week with new vascular grafts  AKI Lactic acidosis> improving rapidly hyperkalemia -CRRT per nephrology; may need change in dialysate with hyperkalemia -strict I/Os -renally dose meds, avoid nephrotoxic meds -need to  continue foley   Hypomagnesemia-resolved  Hyponatremia - resolved Hypocalcemia- repleted -renal function panels BID  Hx HTN Hx RBBB chronically Hx nonrheumatic aortic valve stenosis  -Hold antihypertensives in the setting of shock. -Continue Tele and ICU monitoring.    Best practice (right click and "Reselect all SmartList Selections" daily)  Diet:  NPO NGT LIWS Pain/Anxiety/Delirium protocol (if indicated): Yes (RASS goal 0) VAP protocol (if indicated): Yes DVT prophylaxis: Subcutaneous Heparin GI prophylaxis: PPI Glucose control:  SSI Yes Central venous access:  Yes, and it is still needed Arterial line:  Yes, and it is still needed Foley:  Yes, and it is still needed Mobility:  bed rest  PT consulted: N/A Last date of multidisciplinary goals of care discussion [per primary, no family at bedside this morning] Code Status:  full code Disposition: ICU       This patient is critically ill with multiple organ system failure which requires frequent high complexity decision making, assessment, support, evaluation, and titration of therapies. This was completed through the application of advanced monitoring technologies and extensive interpretation of multiple databases. During this encounter critical care time was devoted to patient care services described in this note for 50 minutes.  Julian Hy, DO 12/03/20 12:02 PM [facility]  For contact information, see Amion. If no response to pager, please call PCCM consult pager. After hours, 7PM- 7AM, please call Elink.

## 2020-12-03 NOTE — Progress Notes (Signed)
2 Days Post-Op   Subjective/Chief Complaint: On vent Minimal UOP   Objective: Vital signs in last 24 hours: Temp:  [94.46 F (34.7 C)-98.8 F (37.1 C)] 95 F (35 C) (06/05 0600) Pulse Rate:  [82-126] 106 (06/05 0600) Resp:  [21-22] 21 (06/05 0355) BP: (60-210)/(13-153) 113/88 (06/05 0450) SpO2:  [88 %-100 %] 98 % (06/05 0600) Arterial Line BP: (83-176)/(33-70) 108/53 (06/05 0600) FiO2 (%):  [35 %-40 %] 40 % (06/05 0400) Weight:  [68.2 kg] 68.2 kg (06/05 0400)    Intake/Output from previous day: 06/04 0701 - 06/05 0700 In: 4393.1 [I.V.:3659.6; IV Piggyback:733.5] Out: 5982 [Urine:10; Emesis/NG output:300] Intake/Output this shift: No intake/output data recorded.  Exam: On vent Abdomen soft, incision clean, ostomy pink, no bile in bag yet  Lab Results:  Recent Labs    12/02/20 0527 12/02/20 1301 12/02/20 2101  WBC 19.9*  --  25.5*  HGB 12.3* 11.2* 11.4*  HCT 34.7* 32.0* 34.0*  PLT 65*  --  59*   BMET Recent Labs    12/02/20 1638 12/03/20 0423  NA 135 134*  K 4.9 5.0  CL 98 98  CO2 28 25  GLUCOSE 64* 83  BUN 12 14  CREATININE 1.41* 1.42*  CALCIUM 6.8* 6.6*   PT/INR Recent Labs    12/21/2020 2347 12/02/20 0527  LABPROT 18.4* 18.1*  INR 1.5* 1.5*   ABG Recent Labs    12/09/2020 1625 12/11/2020 1826  PHART 7.284* 7.487*  HCO3 21.8 19.0*    Studies/Results: ECHOCARDIOGRAM LIMITED  Result Date: 12/02/2020    ECHOCARDIOGRAM LIMITED REPORT   Patient Name:   Brian Yates Date of Exam: 12/02/2020 Medical Rec #:  [id number]        Height:       68.0 in Accession #:    [id number]       Weight:       158.3 lb Date of Birth:  11/18/1940        BSA:          1.850 m Patient Age:    80 years         BP:           94/33 mmHg Patient Gender: M                HR:           94 bpm. Exam Location:  Inpatient Procedure: Limited Echo and Cardiac Doppler Indications:    I31.3 Pericardial effusion  History:        Patient has prior history of Echocardiogram examinations,  most                 recent 11/30/2020. CAD, COPD; Risk Factors:Hypertension and                 Dyslipidemia.  Sonographer:    Raquel Sarna Senior RDCS Referring Phys: [phone] MIHAI CROITORU  Sonographer Comments: Scanned supine on artificial respirator, poor windows due to lung interference. IMPRESSIONS  1. Small-moderate pericardial effusion anterior to the right ventricle unchanged from 11/30/20. No tamponade. Moderate pericardial effusion. There is no evidence of cardiac tamponade. FINDINGS  Pericardium: Small-moderate pericardial effusion anterior to the right ventricle unchanged from 11/30/20. No tamponade. A moderately sized pericardial effusion is present. There is no evidence of cardiac tamponade. Skeet Latch MD Electronically signed by Skeet Latch MD Signature Date/Time: 12/02/2020/4:20:27 PM    Final     Anti-infectives: Anti-infectives (From admission, onward)   Start     Dose/Rate  Route Frequency Ordered Stop   12/02/20 1830  anidulafungin (ERAXIS) 100 mg in sodium chloride 0.9 % 100 mL IVPB        100 mg 78 mL/hr over 100 Minutes Intravenous Every 24 hours 12/07/2020 1711     12/06/2020 2200  vancomycin (VANCOREADY) IVPB 750 mg/150 mL        750 mg 150 mL/hr over 60 Minutes Intravenous Every 24 hours 11/30/20 1729     12/26/2020 1800  anidulafungin (ERAXIS) 200 mg in sodium chloride 0.9 % 200 mL IVPB        200 mg 78 mL/hr over 200 Minutes Intravenous  Once 12/26/2020 1711 12/12/2020 2233   12/23/2020 1600  metroNIDAZOLE (FLAGYL) IVPB 500 mg        500 mg 100 mL/hr over 60 Minutes Intravenous Every 8 hours 12/22/2020 1342     12/24/2020 0500  ceFEPIme (MAXIPIME) 2 g in sodium chloride 0.9 % 100 mL IVPB        2 g 200 mL/hr over 30 Minutes Intravenous Every 12 hours 11/30/20 1729     11/30/20 1815  vancomycin (VANCOREADY) IVPB 1250 mg/250 mL        1,250 mg 166.7 mL/hr over 90 Minutes Intravenous  Once 11/30/20 1729 11/30/20 1944   11/30/20 1430  vancomycin (VANCOREADY) IVPB 1250 mg/250 mL  Status:   Discontinued        1,250 mg 166.7 mL/hr over 90 Minutes Intravenous Every 48 hours 11/30/20 1248 11/30/20 1729   11/30/20 1400  ceFEPIme (MAXIPIME) 2 g in sodium chloride 0.9 % 100 mL IVPB  Status:  Discontinued        2 g 200 mL/hr over 30 Minutes Intravenous Every 24 hours 11/30/20 1248 11/30/20 1729   12/18/2020 2115  ceFAZolin (ANCEF) IVPB 2g/100 mL premix        2 g 200 mL/hr over 30 Minutes Intravenous Every 8 hours 12/23/2020 2023 11/30/20 0447   12/22/2020 1007  ceFAZolin (ANCEF) IVPB 2g/100 mL premix        2 g 200 mL/hr over 30 Minutes Intravenous 30 min pre-op 12/08/2020 1007 12/06/2020 1142      Assessment/Plan: s/p Procedure(s): EXPLORATORY LAPAROTOMY (N/A) SUBTOTAL COLECTOMY WITH ENDOILIOSTOMY  POD#2   LOS: 4 days   Post op ileus Hold on tube feeds until ostomy is functioning Care per CCM CRRT WBC up   Coralie Keens MD 12/03/2020

## 2020-12-03 NOTE — Plan of Care (Signed)

## 2020-12-03 NOTE — Progress Notes (Signed)
Patient ID: ABDUR HOGLUND, male   DOB: Sep 17, 1940, 80 y.o.   MRN: [id number] [facility] Progress Note   Assessment/ Plan:   1. Acute kidney Injury: Anuric with 10 cc urine output overnight, etiology appears cardiac arrest and consequent ischemic ATN.  Continue CRRT for regulation of metabolic abnormalities/azotemia and volume management.  Follow potassium levels closely on subsequent labs to see if pre and post filter fluid need to be adjusted to 2K bath. 2.  Anion gap metabolic acidosis: Corrected with CRRT, discontinue sodium bicarbonate drip. 3.  Cardiac arrest/shock: Weaning down pressors, on CRRT for management of metabolic acidosis that is expected to improve further status post a subtotal colectomy. 4.  Status post aortobifemoral bypass grafting with reimplantation of IMA along with bilateral common femoral and profundofemoral endarterectomy.  Ongoing close monitoring by vascular surgery. 5.  Anemia: Status post earlier PRBC transfusions and stable hemoglobin/hematocrit overnight. 6.  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  Subjective:   Without acute events overnight, tolerating CRRT without problems   Objective:   BP 113/88   Pulse (!) 106   Temp (!) 95 F (35 C)   Resp (!) 21   Ht '5\' 8"'  (1.727 m)   Wt 68.2 kg   SpO2 98%   BMI 22.86 kg/m   Intake/Output Summary (Last 24 hours) at 12/03/2020 9983 Last data filed at 12/03/2020 0600 Gross per 24 hour  Intake 4377.85 ml  Output 5982 ml  Net -1604.15 ml   Weight change: -3.6 kg  Physical Exam: Gen: Intubated, sedated, appears comfortable CVS: Regular tachycardia, S1 and S2 with ejection systolic murmur Resp: Anteriorly clear to auscultation, no rales/rhonchi Abd: Right lower quadrant ostomy site pink, low pitched bowel sounds Ext: Trace-1+ lower extremity edema  Imaging: ECHOCARDIOGRAM LIMITED  Result Date: 12/02/2020    ECHOCARDIOGRAM LIMITED REPORT   Patient Name:   WAYLIN DORKO Date of Exam: 12/02/2020 Medical Rec #:  [id number]        Height:       68.0 in Accession #:    [id number]       Weight:       158.3 lb Date of Birth:  January 22, 1941        BSA:          1.850 m Patient Age:    85 years         BP:           94/33 mmHg Patient Gender: M                HR:           94 bpm. Exam Location:  Inpatient Procedure: Limited Echo and Cardiac Doppler Indications:    I31.3 Pericardial effusion  History:        Patient has prior history of Echocardiogram examinations, most                 recent 11/30/2020. CAD, COPD; Risk Factors:Hypertension and                 Dyslipidemia.  Sonographer:    Raquel Sarna Senior RDCS Referring Phys: [phone] MIHAI CROITORU  Sonographer Comments: Scanned supine on artificial respirator, poor windows due to lung interference. IMPRESSIONS  1. Small-moderate pericardial effusion anterior to the right ventricle unchanged from 11/30/20. No tamponade. Moderate pericardial effusion. There is no evidence of cardiac tamponade. FINDINGS  Pericardium: Small-moderate pericardial effusion anterior to the right ventricle unchanged from 11/30/20. No  tamponade. A moderately sized pericardial effusion is present. There is no evidence of cardiac tamponade. Skeet Latch MD Electronically signed by Skeet Latch MD Signature Date/Time: 12/02/2020/4:20:27 PM    Final     Labs: BMET Recent Labs  Lab 11/30/20 2157 12/22/2020 7092 12/18/2020 0800 12/26/2020 1115 12/08/2020 1456 12/14/2020 1550 11/30/2020 1625 12/26/2020 1826 12/07/2020 1900 12/02/20 0527 12/02/20 1638 12/03/20 0423  NA 135 134*   < > 136   < > 135 138 137 136 136 135 134*  K 4.2 4.3   < > 3.3*   < > 3.8 3.8 3.5 3.6 3.3* 4.9 5.0  CL 102 102  --  103  --   --   --   --  100 101 98 98  CO2 19* 17*  --  20*  --   --   --   --  18* '23 28 25  ' GLUCOSE 135* 107*  --  92  --   --   --   --  79 83 64* 83  BUN 28* 20  --  13  --   --   --   --  '17 14 12 14  ' CREATININE 2.85* 2.37*  --  1.65*  --   --   --   --  1.98* 1.60*  1.41* 1.42*  CALCIUM 8.3* 7.3*  --  7.0*  --   --   --   --  7.8* 7.5* 6.8* 6.6*  PHOS  --  4.9*  --   --   --   --   --   --  6.8* 4.2 5.3* 4.8*   < > = values in this interval not displayed.   CBC Recent Labs  Lab 12/20/2020 1900 12/05/2020 2347 12/02/20 0527 12/02/20 1301 12/02/20 2101  WBC 14.3* 17.1* 19.9*  --  25.5*  HGB 10.8* 11.7* 12.3* 11.2* 11.4*  HCT 32.7* 33.1* 34.7* 32.0* 34.0*  MCV 90.1 86.2 86.3  --  89.2  PLT 42* 47* 65*  --  59*    Medications:    . sodium chloride   Intravenous Once  . sodium chloride   Intravenous Once  . brimonidine  1 drop Both Eyes TID  . chlorhexidine gluconate (MEDLINE KIT)  15 mL Mouth Rinse BID  . Chlorhexidine Gluconate Cloth  6 each Topical Daily  . docusate  100 mg Per Tube BID  . heparin  5,000 Units Subcutaneous Q8H  . insulin aspart  1-3 Units Subcutaneous Q4H  . ipratropium-albuterol  3 mL Nebulization Q6H  . latanoprost  1 drop Both Eyes QHS  . mouth rinse  15 mL Mouth Rinse 10 times per day  . pantoprazole (PROTONIX) IV  40 mg Intravenous QHS  . polyethylene glycol  17 g Per Tube Daily  . sodium chloride flush  10-40 mL Intracatheter Q12H   Elmarie Shiley, MD 12/03/2020, 7:23 AM

## 2020-12-03 NOTE — Progress Notes (Signed)
Progress Note  Patient Name: Brian Yates Date of Encounter: 12/03/2020  [facility] Cardiologist: Shirlee More, MD   Subjective   Remains critically ill, pressor dependent. Occasional episodes of profound hypotension requiring pressor escalation and volume administration, interruption of CRRT. F/U echo showed no change in small-moderate pericardial effusion.  Inpatient Medications    Scheduled Meds: . sodium chloride   Intravenous Once  . sodium chloride   Intravenous Once  . brimonidine  1 drop Both Eyes TID  . chlorhexidine gluconate (MEDLINE KIT)  15 mL Mouth Rinse BID  . Chlorhexidine Gluconate Cloth  6 each Topical Daily  . docusate  100 mg Per Tube BID  . heparin  5,000 Units Subcutaneous Q8H  . insulin aspart  1-3 Units Subcutaneous Q4H  . ipratropium-albuterol  3 mL Nebulization Q6H  . latanoprost  1 drop Both Eyes QHS  . mouth rinse  15 mL Mouth Rinse 10 times per day  . pantoprazole (PROTONIX) IV  40 mg Intravenous QHS  . polyethylene glycol  17 g Per Tube Daily  . sodium chloride flush  10-40 mL Intracatheter Q12H   Continuous Infusions: .  prismasol BGK 4/2.5 400 mL/hr at 12/02/20 2137  .  prismasol BGK 4/2.5 400 mL/hr at 12/02/20 2134  . sodium chloride 10 mL/hr at 11/30/20 1200  . anidulafungin Stopped (12/02/20 2006)  . ceFEPime (MAXIPIME) IV Stopped (12/03/20 0513)  . dextrose 25 mL/hr at 12/03/20 1000  . epinephrine Stopped (12/03/20 0534)  . metronidazole Stopped (12/03/20 0911)  . norepinephrine (LEVOPHED) Adult infusion 20 mcg/min (12/03/20 1000)  . prismasol BGK 4/2.5 1,500 mL/hr at 12/03/20 0923  . propofol (DIPRIVAN) infusion Stopped (12/03/20 0730)  . vancomycin Stopped (12/02/20 2220)   PRN Meds: acetaminophen **OR** acetaminophen, alum & mag hydroxide-simeth, bisacodyl, diphenhydrAMINE, fentaNYL (SUBLIMAZE) injection, fentaNYL (SUBLIMAZE) injection, guaiFENesin-dextromethorphan, heparin, ondansetron, phenol, potassium chloride, sodium  chloride, sodium chloride flush   Vital Signs    Vitals:   12/03/20 0951 12/03/20 1000 12/03/20 1015 12/03/20 1030  BP:  90/70    Pulse: 93 78 74 74  Resp:      Temp: (!) 97.16 F (36.2 C) (!) 97.2 F (36.2 C) (!) 97.2 F (36.2 C) (!) 97.34 F (36.3 C)  TempSrc:      SpO2: 91% 100% 100% 96%  Weight:      Height:        Intake/Output Summary (Last 24 hours) at 12/03/2020 1039 Last data filed at 12/03/2020 1000 Gross per 24 hour  Intake 4263.18 ml  Output 6269 ml  Net -2005.82 ml   Last 3 Weights 12/03/2020 12/02/2020 12/12/2020  Weight (lbs) 150 lb 5.7 oz 158 lb 4.6 oz 153 lb 7 oz  Weight (kg) 68.2 kg 71.8 kg 69.6 kg      Telemetry    SR with frequent PACs, 1st deg AVB - Personally Reviewed  ECG    SR, PACs, 1st deg AVB, RBBB+LAFB - Personally Reviewed  Physical Exam  Awake, lightly sedated, follows some commands GEN: No acute distress.   Neck: No JVD Cardiac: RRR with freq ectopy, 2/6 Ao ejection murmur, no diastolic murmurs, rubs, or gallops.  Respiratory: Clear to auscultation bilaterally. GI: Soft,  non-distended . Absent bowel sounds. Ileostomy with minimal output MS: No edema; No deformity. Neuro:  Nonfocal  Psych: Normal affect   Labs    High Sensitivity Troponin:   Recent Labs  Lab 11/30/20 1227 11/30/20 1506 12/09/2020 0753  TROPONINIHS 206* 302* 955*  Chemistry Recent Labs  Lab 12/02/20 0527 12/02/20 1638 12/03/20 0423  NA 136 135 134*  K 3.3* 4.9 5.0  CL 101 98 98  CO2 _0 GLUCOSE 83 64* 83  BUN _1 CREATININE 1.60* 1.41* 1.42*  CALCIUM 7.5* 6.8* 6.6*  PROT 4.3* 4.3* 4.3*  ALBUMIN 2.3* 2.6* 2.3*  AST 2,518* 2,103* 2,390*  ALT 794* 584* 555*  ALKPHOS 82 80 99  BILITOT 1.8* 2.4* 2.7*  GFRNONAA 44* 51* 51*  ANIONGAP _2 Hematology Recent Labs  Lab 12/09/2020 2347 12/02/20 0527 12/02/20 1301 12/02/20 2101  WBC 17.1* 19.9*  --  25.5*  RBC 3.84* 4.02*  --  3.81*  HGB 11.7* 12.3* 11.2* 11.4*  HCT 33.1* 34.7*  32.0* 34.0*  MCV 86.2 86.3  --  89.2  MCH 30.5 30.6  --  29.9  MCHC 35.3 35.4  --  33.5  RDW 15.8* 15.9*  --  16.6*  PLT 47* 65*  --  59*    BNP Recent Labs  Lab 11/30/20 1227  BNP 372.5*     DDimer  Recent Labs  Lab 11/30/20 1505 11/30/20 2157 12/23/2020 0753  DDIMER 4.34* 4.28* 3.92*     Radiology    ECHOCARDIOGRAM LIMITED  Result Date: 12/02/2020    ECHOCARDIOGRAM LIMITED REPORT   Patient Name:   Brian Yates Date of Exam: 12/02/2020 Medical Rec #:  [id number]        Height:       68.0 in Accession #:    [id number]       Weight:       158.3 lb Date of Birth:  [birth year]        BSA:          1.850 m Patient Age:    14 years         BP:           94/33 mmHg Patient Gender: M                HR:           94 bpm. Exam Location:  Inpatient Procedure: Limited Echo and Cardiac Doppler Indications:    I31.3 Pericardial effusion  History:        Patient has prior history of Echocardiogram examinations, most                 recent 11/30/2020. CAD, COPD; Risk Factors:Hypertension and                 Dyslipidemia.  Sonographer:    Raquel Sarna Senior RDCS Referring Phys: [phone] Adilene Areola  Sonographer Comments: Scanned supine on artificial respirator, poor windows due to lung interference. IMPRESSIONS  1. Small-moderate pericardial effusion anterior to the right ventricle unchanged from 11/30/20. No tamponade. Moderate pericardial effusion. There is no evidence of cardiac tamponade. FINDINGS  Pericardium: Small-moderate pericardial effusion anterior to the right ventricle unchanged from 11/30/20. No tamponade. A moderately sized pericardial effusion is present. There is no evidence of cardiac tamponade. Skeet Latch MD Electronically signed by Skeet Latch MD Signature Date/Time: 12/02/2020/4:20:27 PM    Final     Cardiac Studies   Echo 12/02/2020 1. Small-moderate pericardial effusion anterior to the right ventricle  unchanged from 11/30/20. No tamponade. Moderate pericardial effusion. There  is  no evidence of cardiac tamponade.    Patient Profile     80 y.o. male with a hx of aortic valve stenosis, bifascicular  block, hypertension, hyperlipidemia, history of coronary artery calcification seen on CT scan,lung cancer,history of hepatitis B, severe PAD and history of sinus pauses seen on heart monitorwho is being seen 6/3/2022for the evaluation of PEA arrestand shock in the setting of ischemic bowel and intraperitoneal hemorrhage.  Assessment & Plan    1. Shock - triggered by ischemic bowel and bleeding, now s/p laparotomy and colectomy.. Had PEA arrest. Remains critically ill, pressor dependent. Recurrent hypotension worrisome for bleeding. 2. Bifascicular block (chronic) - Monitor for high grade AV block.Can't exclude occasional Mobitz 1 block on telemetry (hard to say due to frequent PACs), but no significant bradyarrhythmia. 3. Pericardial effusion - stable, not hemodynamically significant 4. Elevated troponin - following CPR. Recent nuclear study showed no ischemia. 5. Acute renal failure - Anuric. On CRRT. 6. PAD: s/p Aobifem bypass.  CRITICAL CARE Performed by: Dani Gobble Xue Low   Total critical care time: 45 minutes  Critical care time was exclusive of separately billable procedures and treating other patients.  Critical care was necessary to treat or prevent imminent or life-threatening deterioration.  Critical care was time spent personally by me on the following activities: development of treatment plan with patient and/or surrogate as well as nursing, discussions with consultants, evaluation of patient's response to treatment, examination of patient, obtaining history from patient or surrogate, ordering and performing treatments and interventions, ordering and review of laboratory studies, ordering and review of radiographic studies, pulse oximetry and re-evaluation of patient's condition.      For questions or updates, please contact [place] Please  consult [email or web address] for contact info under        Signed, Sanda Klein, MD  12/03/2020, 10:39 AM

## 2020-12-03 NOTE — Progress Notes (Signed)
[facility]  Subjective  - awake following commands   Objective 113/88 (!) 120 (!) 96.44 F (35.8 C) (!) 21 99%  Intake/Output Summary (Last 24 hours) at 12/03/2020 2633 Last data filed at 12/03/2020 0800 Gross per 24 hour  Intake 4438.55 ml  Output 5789 ml  Net -1350.45 ml   Abdomen incision clean Ileostomy pink Right groin incision with small amount of bloody drainage Left groin incision healing Feet perfused  First toe dusky bilaterally  Assessment/Planning: Continue to wean pressors if tolerated VDRF per CCM Anuric renal failure CVVH per renal Subtotal colectomy with ileostomy  Stoma appears viable diet per Gen Surg when functioning Dry dressing right groin while oozing and to protect from stoma Wife updated by phone  Ruta Hinds 12/03/2020 8:03 AM --  Laboratory Lab Results: Recent Labs    12/02/20 0527 12/02/20 1301 12/02/20 2101  WBC 19.9*  --  25.5*  HGB 12.3* 11.2* 11.4*  HCT 34.7* 32.0* 34.0*  PLT 65*  --  59*   BMET Recent Labs    12/02/20 1638 12/03/20 0423  NA 135 134*  K 4.9 5.0  CL 98 98  CO2 28 25  GLUCOSE 64* 83  BUN 12 14  CREATININE 1.41* 1.42*  CALCIUM 6.8* 6.6*    COAG Lab Results  Component Value Date   INR 1.5 (H) 12/02/2020   INR 1.5 (H) 12/19/2020   INR 2.0 (H) 12/07/2020   No results found for: PTT

## 2020-12-03 NOTE — Progress Notes (Signed)
Family updated at bedside this afternoon.  Julian Hy, DO 12/03/20 2:37 PM [facility]

## 2020-12-03 NOTE — Progress Notes (Signed)
Event Note:  Patient placed on CPAP vent wean at 0831. 28mcg Fentanyl given at [phone] for patient endorsement of pain. At 0942, vent alarming for apnea with low MV. Attempted to stimulate patient to initiate breaths. Patient shook head and continued with low respiratory effort. Placed back on PRVC at 0942, RT notified. Sudden hypotension at 0948 (45/21), desatting to 80's & CRRT alarming for low flow. Immediately increased O2 to 100%, laid bed flat, and increased pressors. MD called to bedside and gave verbal orders for 500 LR bolus. CRRT blood returned. CCM MD ordered STAT labs.   ISTAT finding resulted K 5.4 and CRRT effluent slightly pink tinged, which is a change from previous. Nephrology MD notified and orders will be placed.   Pt now stable, BP WDL and able to decrease pressors. Will remain on PRVC and back down to 35% Fi02. Will restart CRRT when new orders are placed.   Brian Siemon RN

## 2020-12-03 NOTE — Progress Notes (Signed)
Shift Summary: weaned off sedation, SBT failed this AM with respiratory distress and hypotension  N: Alert, nods appropriately and starting to communicate needs, MAE spontaneously, LLE and RUE weakly following commands. Prismaflex warmer and bair hugger on. PERRLA 3/sluggish.   R: ETT in place. SBT this AM for an hour but ended for desatting and hypotension which lead to minimal responsiveness. Clear/dim lungs, no secretions.   CV: SR 90-120 with increasing ectopy. Rhythm appears to be NSR w PAC and PJCs, occasionally in PAC bigeminy. BP requiring levophed gtt. Hypotension from SBT attempt resolved with 500 LR bolus and increasing pressors after returning to [facility] mode. 1+ palpable pulses, all extremities warm/dry throughout. BLE becoming more dusky, vascular team aware. RUE weeping., generalized 1+ edema with BUE 2+.  GI: NPO until cleared by general surgery. NGT to LIS, 335mL bile output. No BM. BG WDL. Illeostomy in place, stoma bright red/prink, site CDI, no output.  GU: foley in place, no output. CRRT goal to -100/hr; ending shift -464mL. K+ baths changed this afternoon by nephrology for hyperkalemia. Recheck this evening remained hyperkalemic.  Skin: New skin tear beneath arterial line dressing where skin is saturated from weeping.  Family at bedside throughout shift. Updated additional family members via phone.   Aaren Krog RN

## 2020-12-03 NOTE — Progress Notes (Signed)
Pharmacy Antibiotic Note  Brian Yates is a 80 y.o. male admitted on 12/19/2020 with aortobifem bypass grafting. Post-op, patient developed AKI and ultimately PEA arrested. Pharmacy has been consulted for vancomycin and cefepime dosing given elevated WBC. WBC improved but still elevated 25.5, afebrile. Remains on CRRT.  Plan: Continue Cefepime 2 gm IV q12 hrs Continue Vancomycin 750 mg IV q24 hrs F/u LOT and ability to narrow therapy, likely continue for at least 7 days given recent vascular grafts Monitor CRRT duration/tolerability, clinical progress   Height: 5\' 8"  (172.7 cm) Weight: 68.2 kg (150 lb 5.7 oz) IBW/kg (Calculated) : 68.4  Temp (24hrs), Avg:96.9 F (36.1 C), Min:94.46 F (34.7 C), Max:98.8 F (37.1 C)  Recent Labs  Lab 11/30/20 1844 11/30/20 2157 11/30/2020 0753 12/12/2020 1115 12/22/2020 1800 12/14/2020 1900 12/14/2020 2347 12/02/20 0527 12/02/20 1638 12/02/20 2101 12/03/20 0423  WBC 45.9*   < >  --  39.8*  --  14.3* 17.1* 19.9*  --  25.5*  --   CREATININE 3.08*   < >  --  1.65*  --  1.98*  --  1.60* 1.41*  --  1.42*  LATICACIDVEN 9.8*  --  9.5*  --  >11.0*  --  5.9* 4.2*  --   --   --    < > = values in this interval not displayed.    Estimated Creatinine Clearance: 41.4 mL/min (A) (by C-G formula based on SCr of 1.42 mg/dL (H)).    Allergies  Allergen Reactions  . Bentyl [Dicyclomine Hcl] Other (See Comments)    Hallucinations   . Codeine Sulfate [Codeine] Nausea And Vomiting  . Lisinopril Other (See Comments)    unknown  . Valsartan Other (See Comments)    unknown    Antimicrobials this admission: Cefepime 6/2 >>  Vancomycin 6/2 >>    Microbiology results: N/A  Richardine Service, PharmD, BCPS PGY2 Cardiology Pharmacy Resident Phone: [phone] 12/03/2020  9:25 AM  Please check [email or web address] for unit-specific pharmacy phone numbers.

## 2020-12-04 ENCOUNTER — Encounter (HOSPITAL_COMMUNITY): Payer: Self-pay | Admitting: Vascular Surgery

## 2020-12-04 ENCOUNTER — Inpatient Hospital Stay (HOSPITAL_COMMUNITY): Payer: Medicare Other

## 2020-12-04 LAB — TYPE AND SCREEN
ABO/RH(D): O POS
Antibody Screen: NEGATIVE
Unit division: 0
Unit division: 0
Unit division: 0
Unit division: 0
Unit division: 0
Unit division: 0
Unit division: 0
Unit division: 0

## 2020-12-04 LAB — GLUCOSE, CAPILLARY
Glucose-Capillary: 183 mg/dL — ABNORMAL HIGH (ref 70–99)
Glucose-Capillary: 62 mg/dL — ABNORMAL LOW (ref 70–99)
Glucose-Capillary: >600 mg/dL (ref 70–99)

## 2020-12-04 LAB — BPAM RBC
Blood Product Expiration Date: 202207062359
Blood Product Expiration Date: 202207072359
Blood Product Expiration Date: 202207072359
Blood Product Expiration Date: 202207072359
Blood Product Expiration Date: 202207082359
Blood Product Expiration Date: 202207082359
Blood Product Expiration Date: 202207082359
Blood Product Expiration Date: 202207082359
ISSUE DATE / TIME: 202206031502
ISSUE DATE / TIME: 202206031502
ISSUE DATE / TIME: 202206031502
ISSUE DATE / TIME: 202206031502
Unit Type and Rh: 5100
Unit Type and Rh: 5100
Unit Type and Rh: 5100
Unit Type and Rh: 5100
Unit Type and Rh: 5100
Unit Type and Rh: 5100
Unit Type and Rh: 5100
Unit Type and Rh: 5100

## 2020-12-04 LAB — LACTIC ACID, PLASMA: Lactic Acid, Venous: >11 mmol/L (ref 0.5–1.9)

## 2020-12-04 LAB — APTT: aPTT: 79 seconds — ABNORMAL HIGH (ref 24–36)

## 2020-12-04 LAB — POCT I-STAT 7, (LYTES, BLD GAS, ICA,H+H)
Acid-base deficit: 1 mmol/L (ref 0.0–2.0)
Bicarbonate: 25.3 mmol/L (ref 20.0–28.0)
Calcium, Ion: 2.47 mmol/L (ref 1.15–1.40)
HCT: 30 % — ABNORMAL LOW (ref 39.0–52.0)
Hemoglobin: 10.2 g/dL — ABNORMAL LOW (ref 13.0–17.0)
O2 Saturation: 98 %
Patient temperature: 28.8
Potassium: 8.4 mmol/L (ref 3.5–5.1)
Sodium: 131 mmol/L — ABNORMAL LOW (ref 135–145)
TCO2: 27 mmol/L (ref 22–32)
pCO2 arterial: 34.6 mmHg (ref 32.0–48.0)
pH, Arterial: 7.433 (ref 7.350–7.450)
pO2, Arterial: 82 mmHg — ABNORMAL LOW (ref 83.0–108.0)

## 2020-12-04 LAB — TRIGLYCERIDES: Triglycerides: 152 mg/dL — ABNORMAL HIGH (ref <150)

## 2020-12-04 LAB — PROTIME-INR
INR: 3 — ABNORMAL HIGH (ref 0.8–1.2)
Prothrombin Time: 30.9 seconds — ABNORMAL HIGH (ref 11.4–15.2)

## 2020-12-04 MED ORDER — DEXTROSE 50 % IV SOLN: 50.0000 mL | Freq: Once | INTRAVENOUS | Status: AC

## 2020-12-04 MED ORDER — DEXTROSE 50 % IV SOLN
INTRAVENOUS | Status: AC
  Administered 2020-12-04: 50 mL via INTRAVENOUS
  Filled 2020-12-04: qty 50

## 2020-12-04 MED FILL — Medication: Qty: 1 | Status: AC

## 2020-12-05 ENCOUNTER — Other Ambulatory Visit: Payer: Medicare Other

## 2020-12-05 MED FILL — Heparin Sodium (Porcine) Inj 1000 Unit/ML: INTRAMUSCULAR | Qty: 30 | Status: AC

## 2020-12-05 MED FILL — Sodium Chloride IV Soln 0.9%: INTRAVENOUS | Qty: 1000 | Status: AC

## 2020-12-06 LAB — SURGICAL PATHOLOGY

## 2020-12-28 ENCOUNTER — Ambulatory Visit: Payer: Medicare Other | Admitting: Cardiology

## 2020-12-29 NOTE — Progress Notes (Signed)
[facility] Progress Note Patient Name: Brian Yates DOB: 1941-02-02 MRN: [id number]   Date of Service  2020-12-10  HPI/Events of Note  Patient having recurrent hypoglycemia despite being on D10 @ 25cc/hr. He has significant LFT abnormalities.   eICU Interventions  Increase D10 to 60 mL/hr to help prevent recurrence.     Intervention Category Intermediate Interventions: Other:  Charlott Rakes 12/10/2020, 12:56 AM

## 2020-12-29 NOTE — Significant Event (Signed)
  Critical Care Medicine Note Spoke with the patient's wife during ongoing 3rd coding event.  She states she saw the patient yesterday, and she felt he was communicating to her, somehow "he knew he was about the die."  She states he would not want continued chest compressions and resuscitative efforts under these circumstances.  He has had "broken ribs" before, and she does not want to see him suffer any longer.  She elected/ask that we stop resuscitative/CPR efforts.  She states she will be driving in with 2 other family members including his son.  She states she lives about an hour away.  We advised her that we will keep the body in the room, and await her arrival.    ___________________ Samuella Cota. Reinaldo Berber, MD [facility]

## 2020-12-29 NOTE — Progress Notes (Signed)
IVT notes:  Responded to code blue; pt with IV access cvl. IVT assistance provided.

## 2020-12-29 NOTE — Significant Event (Signed)
Cardiopulmonary resuscitation record  At 0305 patient went into asystolic cardiac arrest.  He got 1 dose of epinephrine.  Subsequently, was found to have V. fib on rhythm check and received 1 shock. In total patient received 4 rounds of epinephrine, 2 Amps of bicarb, 1 amp of D50, 1 g of calcium, and 10 units of insulin.  Potassium on i-STAT was 8.7.  At 0316, patient achieved ROSC and was started on norepinephrine drip and IVF.  At 0331, patient went into asystolic arrest again, he received 2 rounds of epinephrine, 1 amp of bicarb and 1 amp of calcium.  After he achieved ROSC, patient received 150 mg amiodarone bolus as he had a wide-complex tachycardia on monitor. Bedside US showed cardiac activity, reduced EF globally, and lung sliding b/l.   At 0343, patient went into asystolic cardiac arrest again, received 3 rounds of epinephrine, 1 amp of bicarb.  Per Dr. Barbie Banner body's conversation with his wife, she was aware of the poor prognosis in setting of multiple cardiac arrests and she decided to stop CPR.  CPR was stopped. Time of death: 37.  Cornelius Moras, MD Pulmonary Medicine

## 2020-12-29 NOTE — Progress Notes (Signed)
6301 Patient went into asystole. Compressions started without delay. Code Blue called. ACLS performed.  0348 After obtaining ROSC patient arrested 2 additional times. Family called by Critical Care MD. Wife opted to cease CPR. Time of death 22.

## 2020-12-29 NOTE — Addendum Note (Signed)
Addendum  created 2020/12/28 0920 by Josephine Igo, CRNA   Order list changed, Pharmacy for encounter modified
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
## 2020-12-29 DEATH — deceased

## 2021-01-08 ENCOUNTER — Ambulatory Visit: Payer: Medicare Other | Admitting: Surgery

## 2021-02-16 ENCOUNTER — Ambulatory Visit: Payer: Medicare Other | Admitting: Oncology
